# Patient Record
Sex: Female | Born: 1975
Health system: Southern US, Community
[De-identification: ages and names within clinical notes are randomized; demographics above are authoritative.]

## PROBLEM LIST (undated history)

## (undated) DIAGNOSIS — F419 Anxiety disorder, unspecified: Secondary | ICD-10-CM

## (undated) DIAGNOSIS — I10 Essential (primary) hypertension: Secondary | ICD-10-CM

## (undated) DIAGNOSIS — K219 Gastro-esophageal reflux disease without esophagitis: Secondary | ICD-10-CM

## (undated) DIAGNOSIS — E0590 Thyrotoxicosis, unspecified without thyrotoxic crisis or storm: Secondary | ICD-10-CM

## (undated) DIAGNOSIS — R519 Headache, unspecified: Secondary | ICD-10-CM

## (undated) HISTORY — DX: Gastro-esophageal reflux disease without esophagitis: K21.9

## (undated) HISTORY — DX: Thyrotoxicosis, unspecified without thyrotoxic crisis or storm: E05.90

## (undated) HISTORY — PX: TUBAL LIGATION: SHX77

## (undated) HISTORY — PX: LEG SURGERY: SHX1003

## (undated) HISTORY — PX: FOOT SURGERY: SHX648

## (undated) HISTORY — PX: OTHER SURGICAL HISTORY: SHX169

---

## 2012-01-21 DIAGNOSIS — M79604 Pain in right leg: Secondary | ICD-10-CM | POA: Insufficient documentation

## 2012-01-21 DIAGNOSIS — L209 Atopic dermatitis, unspecified: Secondary | ICD-10-CM | POA: Insufficient documentation

## 2012-03-18 DIAGNOSIS — L732 Hidradenitis suppurativa: Secondary | ICD-10-CM | POA: Insufficient documentation

## 2017-05-10 DIAGNOSIS — I890 Lymphedema, not elsewhere classified: Secondary | ICD-10-CM | POA: Insufficient documentation

## 2018-02-23 DIAGNOSIS — R079 Chest pain, unspecified: Secondary | ICD-10-CM | POA: Insufficient documentation

## 2018-02-23 DIAGNOSIS — M791 Myalgia, unspecified site: Secondary | ICD-10-CM | POA: Insufficient documentation

## 2018-11-09 DIAGNOSIS — Z720 Tobacco use: Secondary | ICD-10-CM | POA: Insufficient documentation

## 2019-05-15 ENCOUNTER — Other Ambulatory Visit: Payer: Self-pay

## 2019-05-15 DIAGNOSIS — Z20822 Contact with and (suspected) exposure to covid-19: Secondary | ICD-10-CM

## 2019-05-18 LAB — NOVEL CORONAVIRUS, NAA: SARS-CoV-2, NAA: NOT DETECTED

## 2019-06-21 ENCOUNTER — Other Ambulatory Visit: Payer: Self-pay | Admitting: Physician Assistant

## 2019-06-21 ENCOUNTER — Other Ambulatory Visit (HOSPITAL_COMMUNITY): Payer: Self-pay | Admitting: Physician Assistant

## 2019-06-21 DIAGNOSIS — M799 Soft tissue disorder, unspecified: Secondary | ICD-10-CM

## 2019-06-21 DIAGNOSIS — R946 Abnormal results of thyroid function studies: Secondary | ICD-10-CM

## 2019-06-27 ENCOUNTER — Ambulatory Visit (HOSPITAL_COMMUNITY)
Admission: RE | Admit: 2019-06-27 | Discharge: 2019-06-27 | Disposition: A | Discharge status: Home / Self Care | Payer: BC Managed Care – PPO | Source: Ambulatory Visit | Attending: Physician Assistant | Admitting: Physician Assistant

## 2019-06-27 ENCOUNTER — Encounter (HOSPITAL_COMMUNITY): Payer: Self-pay

## 2019-06-27 ENCOUNTER — Other Ambulatory Visit: Payer: Self-pay

## 2019-06-27 DIAGNOSIS — M799 Soft tissue disorder, unspecified: Secondary | ICD-10-CM | POA: Diagnosis present

## 2019-06-27 DIAGNOSIS — R946 Abnormal results of thyroid function studies: Secondary | ICD-10-CM

## 2019-06-30 ENCOUNTER — Other Ambulatory Visit (HOSPITAL_COMMUNITY): Payer: Self-pay | Admitting: Physician Assistant

## 2019-06-30 ENCOUNTER — Other Ambulatory Visit: Payer: Self-pay | Admitting: Physician Assistant

## 2019-06-30 DIAGNOSIS — M799 Soft tissue disorder, unspecified: Secondary | ICD-10-CM

## 2019-06-30 DIAGNOSIS — R946 Abnormal results of thyroid function studies: Secondary | ICD-10-CM

## 2019-07-05 ENCOUNTER — Emergency Department (HOSPITAL_COMMUNITY): Payer: BC Managed Care – PPO

## 2019-07-05 ENCOUNTER — Encounter (HOSPITAL_COMMUNITY): Payer: Self-pay | Admitting: Emergency Medicine

## 2019-07-05 ENCOUNTER — Emergency Department (HOSPITAL_COMMUNITY)
Admission: EM | Admit: 2019-07-05 | Discharge: 2019-07-05 | Disposition: A | Discharge status: Home / Self Care | Payer: BC Managed Care – PPO | Attending: Emergency Medicine | Admitting: Emergency Medicine

## 2019-07-05 ENCOUNTER — Ambulatory Visit (HOSPITAL_COMMUNITY): Payer: BC Managed Care – PPO

## 2019-07-05 ENCOUNTER — Encounter (HOSPITAL_COMMUNITY): Payer: Self-pay

## 2019-07-05 ENCOUNTER — Other Ambulatory Visit: Payer: Self-pay

## 2019-07-05 DIAGNOSIS — I10 Essential (primary) hypertension: Secondary | ICD-10-CM | POA: Insufficient documentation

## 2019-07-05 DIAGNOSIS — E049 Nontoxic goiter, unspecified: Secondary | ICD-10-CM | POA: Diagnosis not present

## 2019-07-05 DIAGNOSIS — E042 Nontoxic multinodular goiter: Secondary | ICD-10-CM | POA: Insufficient documentation

## 2019-07-05 DIAGNOSIS — R200 Anesthesia of skin: Secondary | ICD-10-CM | POA: Insufficient documentation

## 2019-07-05 DIAGNOSIS — F17210 Nicotine dependence, cigarettes, uncomplicated: Secondary | ICD-10-CM | POA: Insufficient documentation

## 2019-07-05 DIAGNOSIS — Z91040 Latex allergy status: Secondary | ICD-10-CM | POA: Diagnosis not present

## 2019-07-05 DIAGNOSIS — M542 Cervicalgia: Secondary | ICD-10-CM | POA: Diagnosis present

## 2019-07-05 HISTORY — DX: Essential (primary) hypertension: I10

## 2019-07-05 LAB — BASIC METABOLIC PANEL
Anion gap: 8 (ref 5–15)
BUN: 9 mg/dL (ref 6–20)
CO2: 25 mmol/L (ref 22–32)
Calcium: 9 mg/dL (ref 8.9–10.3)
Chloride: 104 mmol/L (ref 98–111)
Creatinine, Ser: 0.74 mg/dL (ref 0.44–1.00)
GFR calc Af Amer: >60 mL/min (ref >60)
GFR calc non Af Amer: >60 mL/min (ref >60)
Glucose, Bld: 92 mg/dL (ref 70–99)
Potassium: 4.3 mmol/L (ref 3.5–5.1)
Sodium: 137 mmol/L (ref 135–145)

## 2019-07-05 LAB — POC URINE PREG, ED: Preg Test, Ur: NEGATIVE

## 2019-07-05 MED ORDER — IOHEXOL 300 MG/ML  SOLN
75.0000 mL | Freq: Once | INTRAMUSCULAR | Status: AC | PRN
  Administered 2019-07-05: 75 mL via INTRAVENOUS

## 2019-07-05 NOTE — ED Provider Notes (Signed)
[facility] EMERGENCY DEPARTMENT Provider Note   CSN: [id number] Arrival date & time: 07/05/19  1113     History   Chief Complaint Chief Complaint  Patient presents with  . Neck Pain  . Numbness    HPI Gina Coleman is a 43 y.o. female.     Patient is a 43 year old female who presents to the emergency department with a complaint of neck pain and facial numbness.  The patient states that approximately a week ago she began to have some fullness in her neck and under her neck.  She went to her primary physician she had an ultrasound and was told that she had some abnormality of her thyroid gland.  She was referred to an endocrinologist, but she says she has not heard from the endocrinology specialist yet.  This morning she noticed what felt like more fullness in her neck.  It was accompanied by a sensation of twitching in her left face.  This resolved after she took a shower.  It has not returned.  She reports that the fullness under her jaw and in her neck seems to still be present.  She is not having problems speaking, and she is not having problem swallowing.  She is not short of breath.  She presents now for assistance with these issues.  The history is provided by the patient.  Neck Pain Associated symptoms: no chest pain, no fever, no numbness, no photophobia and no weakness     Past Medical History:  Diagnosis Date  . Hypertension     There are no active problems to display for this patient.   Past Surgical History:  Procedure Laterality Date  . foot sx Right   . leg sx Right      OB History   No obstetric history on file.      Home Medications    Prior to Admission medications   Not on File    Family History No family history on file.  Social History Social History   Tobacco Use  . Smoking status: Current Some Day Smoker    Types: Cigarettes  . Smokeless tobacco: Never Used  Substance Use Topics  . Alcohol use: Yes    Comment: social   .  Drug use: Never     Allergies   Latex   Review of Systems Review of Systems  Constitutional: Negative for activity change, appetite change and fever.  HENT: Negative for congestion, ear discharge, ear pain, facial swelling, nosebleeds, rhinorrhea, sneezing and tinnitus.   Eyes: Negative for photophobia, pain and discharge.  Respiratory: Negative for cough, choking, shortness of breath and wheezing.   Cardiovascular: Negative for chest pain, palpitations and leg swelling.  Gastrointestinal: Negative for abdominal pain, blood in stool, constipation, diarrhea, nausea and vomiting.  Genitourinary: Negative for difficulty urinating, dysuria, flank pain, frequency and hematuria.  Musculoskeletal: Positive for neck pain. Negative for back pain, gait problem and myalgias.  Skin: Negative for color change, rash and wound.  Neurological: Negative for dizziness, seizures, syncope, facial asymmetry, speech difficulty, weakness and numbness.  Hematological: Negative for adenopathy. Does not bruise/bleed easily.  Psychiatric/Behavioral: Negative for agitation, confusion, hallucinations, self-injury and suicidal ideas. The patient is not nervous/anxious.      Physical Exam Updated Vital Signs BP (!) 166/95 (BP Location: Right Arm)   Pulse 69   Temp 98.4 F (36.9 C) (Oral)   Resp 16   Ht 5\' 4"  (1.626 m)   Wt 87.5 kg   SpO2 100%  BMI 33.13 kg/m   Physical Exam Vitals signs and nursing note reviewed.  Constitutional:      General: She is not in acute distress.    Appearance: She is well-developed. She is not toxic-appearing.  HENT:     Head: Normocephalic and atraumatic.     Right Ear: Tympanic membrane and external ear normal.     Left Ear: Tympanic membrane and external ear normal.  Eyes:     General: Lids are normal. No scleral icterus.       Right eye: No discharge.        Left eye: No discharge.     Conjunctiva/sclera: Conjunctivae normal.     Pupils: Pupils are equal, round,  and reactive to light.  Neck:     Musculoskeletal: Normal range of motion and neck supple. Muscular tenderness present. No neck rigidity.     Vascular: No carotid bruit.     Trachea: No tracheal deviation.     Comments: There is full range of motion of the neck. There is question of enlargement of the thyroid. No hot areas noted. Trachea is midline. Cardiovascular:     Rate and Rhythm: Normal rate and regular rhythm.     Pulses: Normal pulses.     Heart sounds: Normal heart sounds.  Pulmonary:     Effort: Pulmonary effort is normal. No respiratory distress.     Breath sounds: Normal breath sounds. No stridor. No wheezing or rales.  Abdominal:     General: Bowel sounds are normal. There is no distension.     Palpations: Abdomen is soft.     Tenderness: There is no abdominal tenderness. There is no guarding or rebound.  Musculoskeletal: Normal range of motion.        General: No tenderness.  Lymphadenopathy:     Head:     Right side of head: No submandibular adenopathy.     Left side of head: No submandibular adenopathy.  Skin:    General: Skin is warm and dry.     Findings: No rash.  Neurological:     Mental Status: She is alert and oriented to person, place, and time.     Cranial Nerves: No cranial nerve deficit.     Sensory: No sensory deficit.     Motor: No abnormal muscle tone or seizure activity.     Coordination: Coordination normal.  Psychiatric:        Speech: Speech normal.      ED Treatments / Results  Labs (all labs ordered are listed, but only abnormal results are displayed) Labs Reviewed  BASIC METABOLIC PANEL  POC URINE PREG, ED    EKG None  Radiology No results found.  Procedures Procedures (including critical care time)  Medications Ordered in ED Medications - No data to display   Initial Impression / Assessment and Plan / ED Course  I have reviewed the triage vital signs and the nursing notes.  Pertinent labs & imaging results that were  available during my care of the patient were reviewed by me and considered in my medical decision making (see chart for details).          Final Clinical Impressions(s) / ED Diagnoses MDM  Vital signs are stable. Airway is patent. No stridor noted. Will obtain CT of the neck for additional evaluation of sensation of fullness under the left jaw and neck.  Urine preg is negative. Bmet is WNL.  Pt waiting for CT. Recheck is negative for an neuro changes.  Pt ambulatory without problem.     Final diagnoses:  Enlarged thyroid  Multiple thyroid nodules    ED Discharge Orders    None       Lily Kocher, PA-C 07/05/19 [place], [place], DO 07/08/19 [phone]

## 2019-07-05 NOTE — Discharge Instructions (Addendum)
Your blood pressure is slightly elevated, otherwise your vital signs are within normal limits.  Your oxygen level is 100% on room air, which is within normal limits.  Your chemistries are within normal limits.  The CT scan of your neck shows no neck mass, or pathological lymph nodes appreciated.  There is no abnormal findings beneath your left jaw.  There are no abnormal abscess areas related to your teeth.  There is noted an enlarged thyroid gland with multiple thyroid nodules.  This is similar to what was seen on your ultrasound on July 28.  Please see the endocrinology specialist as suggested by your primary physician for additional evaluation and management of this issue.  She has multiple

## 2019-07-05 NOTE — ED Triage Notes (Signed)
Pt states that she has had swelling under her left jaw she states that she woke up with a feeling of numbness on the left side of her face.

## 2019-07-10 ENCOUNTER — Other Ambulatory Visit: Payer: Self-pay

## 2019-07-11 ENCOUNTER — Encounter: Payer: Self-pay | Admitting: Internal Medicine

## 2019-07-11 ENCOUNTER — Ambulatory Visit (INDEPENDENT_AMBULATORY_CARE_PROVIDER_SITE_OTHER): Payer: BC Managed Care – PPO | Admitting: Internal Medicine

## 2019-07-11 VITALS — BP 142/82 | HR 84 | Temp 98.5°F | Ht 63.0 in | Wt 197.0 lb

## 2019-07-11 DIAGNOSIS — E042 Nontoxic multinodular goiter: Secondary | ICD-10-CM

## 2019-07-11 DIAGNOSIS — E061 Subacute thyroiditis: Secondary | ICD-10-CM

## 2019-07-11 DIAGNOSIS — R7989 Other specified abnormal findings of blood chemistry: Secondary | ICD-10-CM | POA: Diagnosis not present

## 2019-07-11 LAB — T4, FREE: Free T4: 1.07 ng/dL (ref 0.60–1.60)

## 2019-07-11 LAB — TSH: TSH: 0.46 u[IU]/mL (ref 0.35–4.50)

## 2019-07-11 NOTE — Progress Notes (Signed)
Name: Gina Coleman  MRN/ DOB: [id number], October 21, 1976    Age/ Sex: 43 y.o., female    PCP: Ginger Organ   Reason for Endocrinology Evaluation: Hyperthyroidism     Date of Initial Endocrinology Evaluation: 07/12/2019     HPI: Gina Coleman is a 43 y.o. female with a past medical history of HTN. The patient presented for initial endocrinology clinic visit on 07/12/2019 for consultative assistance with her subclinical hyperthyroidism   Pt was noted to have a low TSH at 0.399 uIU/mL during an evaluation for an enlarged cervical lymph node in 05/2019. She was started on Abx.    Today she is crying in the office, c/o upper neck severe pain that has been there since July, the pain was severe enough for her to reprt to the ED and was told she has " too many  " thyroid nodules and is concerned about this. She takes Aleve for the pain. Has noted some enlargement in her neck on 07/05/2019 which is mainly on the left with extension to her left ear.   Denies dysphagia or voice hoarseness.   No recent viral infections.   No biotin intake   No FH of thyroid disorder  HISTORY:  Past Medical History:  Past Medical History:  Diagnosis Date  . Hypertension    Past Surgical History:  Past Surgical History:  Procedure Laterality Date  . foot sx Right   . leg sx Right       Social History:  reports that she quit smoking 6 days ago. Her smoking use included cigarettes. She has never used smokeless tobacco. She reports current alcohol use. She reports that she does not use drugs.  Family History: family history is not on file.   HOME MEDICATIONS: Allergies as of 07/11/2019      Reactions   Latex       Medication List       Accurate as of July 11, 2019 11:59 PM. If you have any questions, ask your nurse or doctor.        cephALEXin 500 MG capsule Commonly known as: KEFLEX   lisinopril 5 MG tablet Commonly known as: ZESTRIL Take by mouth.         REVIEW  OF SYSTEMS: A comprehensive ROS was conducted with the patient and is negative except as per HPI and below:  Review of Systems  Constitutional: Negative for fever and weight loss.  HENT: Negative for congestion and sore throat.   Eyes: Negative for blurred vision and pain.  Respiratory: Negative for cough and shortness of breath.   Cardiovascular: Negative for chest pain and palpitations.  Gastrointestinal: Positive for diarrhea. Negative for nausea.  Genitourinary: Positive for frequency.  Endo/Heme/Allergies: Positive for polydipsia.  Psychiatric/Behavioral: The patient is nervous/anxious and has insomnia.        OBJECTIVE:  VS: BP (!) 142/82 (BP Location: Left Arm, Patient Position: Sitting, Cuff Size: Large)   Pulse 84   Temp 98.5 F (36.9 C)   Ht 5\' 3"  (1.6 m)   Wt 197 lb (89.4 kg)   SpO2 99%   BMI 34.90 kg/m    Wt Readings from Last 3 Encounters:  07/11/19 197 lb (89.4 kg)  07/05/19 193 lb (87.5 kg)     EXAM: General: Pt is crying   Hydration: Well-hydrated with moist mucous membranes and good skin turgor  Eyes: External eye exam normal without stare, lid lag or exophthalmos.  EOM intact.    Ears,  Nose, Throat: Hearing: Grossly intact bilaterally Dental: Good dentition  Throat: Clear without mass, erythema or exudate  Neck: General: Left cervical adenopathy noted  Thyroid: Thyroid gland shows a right thyroid nodule with tenderness. No thyroid bruit.  Lungs: Clear with good BS bilat with no rales, rhonchi, or wheezes  Heart: Auscultation: RRR.  Abdomen: Normoactive bowel sounds, soft, nontender, without masses or organomegaly palpable  Extremities:  BL LE: No pretibial edema normal ROM and strength.  Skin: Hair: Texture and amount normal with gender appropriate distribution Skin Inspection: No rashes Skin Palpation: Skin temperature, texture, and thickness normal to palpation  Neuro: Cranial nerves: II - XII grossly intact  Motor: Normal strength throughout  DTRs: 2+ and symmetric in UE without delay in relaxation phase  Mental Status: Judgment, insight: Intact Orientation: Oriented to time, place, and person Mood and affect: pt is overwhelmed, concerned and crying      DATA REVIEWED: 06/20/2019  TSH 0.399 uIU/mL (0.450-4.5 )     Thyroid Ultrasound 07/12/2019   Nodule # 1:  Location: Right; Superior  Maximum size: 3.9 cm; Other 2 dimensions: 2.8 x 1.4 cm  Composition: solid/almost completely solid (2)  Echogenicity: hypoechoic (2)  Shape: not taller-than-wide (0)  Margins: smooth (0)  Echogenic foci: none (0)  ACR TI-RADS total points: 4.  ACR TI-RADS risk category: TR4 (4-6 points).  ACR TI-RADS recommendations:  **Given size (>/= 1.5 cm) and appearance, fine needle aspiration of this moderately suspicious nodule should be considered based on TI-RADS criteria.  _________________________________________________________  Nodule # 2: 0.9 cm hypoechoic solid nodule in the right inferior gland. No further follow-up required.  _________________________________________________________  Nodule # 3: 0.9 cm hypoechoic solid nodule in the superior aspect of the thyroid isthmus. No further follow-up required.  _________________________________________________________  Nodule # 4:  Location: Isthmus; Inferior  Maximum size: 1.9 cm; Other 2 dimensions: 1.7 x 1.1 cm  Composition: solid/almost completely solid (2)  Echogenicity: isoechoic (1)  Shape: not taller-than-wide (0)  Margins: ill-defined (0)  Echogenic foci: none (0)  ACR TI-RADS total points: 3.  ACR TI-RADS risk category: TR3 (3 points).  ACR TI-RADS recommendations:  *Given size (>/= 1.5 - 2.4 cm) and appearance, a follow-up ultrasound in 1 year should be considered based on TI-RADS criteria.  _________________________________________________________  IMPRESSION: 1. Diffusely enlarged, heterogeneous and  multinodular thyroid gland. 2. The 3.9 cm TI-RADS category 4 nodule (labeled # 1) in the deep aspect of the right superior gland meets criteria for consideration of fine-needle aspiration biopsy. 3. The 1.9 cm TI-RADS category 3 nodule (labeled # 4) in the inferior aspect of the thyroid isthmus meets criteria for follow-up ultrasound in 1 year. 4. Additional small subcentimeter thyroid nodules do not meet criteria for further evaluation.     Old records , labs and images have been reviewed.   ASSESSMENT/PLAN/RECOMMENDATIONS:   1. Subacute Thyroiditis :   - This is secondary to the current viral infection that she has, repeat TFT's are normal on today's labs.  - She was advised to use NSAID's as needed until the inflammation resolved.     2. Multinodular Goiter :   - The pain is from the inflammation which is secondary to the infection and NOT from her nodules. Her pain is focused on the left, her largest thyroid nodule is on the right - Will proceed with FNA of the Right superior nodule  - Reassurance provided at this time   F/u in 2 months   Signed electronically by: Elenora Gamma  Kelton Pillar, MD  [facility] Endocrinology  [facility] [address] [place], [place] [address] Phone: [phone] FAX: [phone]   CC: Ginger Organ [address] [place] [address] Phone: [phone] Fax: [phone]   Return to Endocrinology clinic as below: Future Appointments  Date Time Provider [place]  08/02/2019  4:00 PM [facility] [place] [facility]  09/07/2019 10:30 AM Yitzchok Carriger, Melanie Crazier, MD [facility] None

## 2019-07-11 NOTE — Patient Instructions (Signed)
-   Stop by the lab today   - We will set you up for biopsy of your right thyroid nodule , if you don;t hear from anyone in 2 weeks, please call us.

## 2019-07-12 ENCOUNTER — Encounter: Payer: Self-pay | Admitting: Internal Medicine

## 2019-07-12 DIAGNOSIS — R7989 Other specified abnormal findings of blood chemistry: Secondary | ICD-10-CM | POA: Insufficient documentation

## 2019-07-12 DIAGNOSIS — E061 Subacute thyroiditis: Secondary | ICD-10-CM | POA: Insufficient documentation

## 2019-07-12 DIAGNOSIS — E042 Nontoxic multinodular goiter: Secondary | ICD-10-CM | POA: Insufficient documentation

## 2019-07-13 LAB — T3: T3, Total: 169 ng/dL (ref 76–181)

## 2019-07-13 LAB — TRAB (TSH RECEPTOR BINDING ANTIBODY): TRAB: <1 IU/L (ref <=2.00)

## 2019-07-26 ENCOUNTER — Telehealth: Payer: Self-pay | Admitting: Internal Medicine

## 2019-07-26 NOTE — Telephone Encounter (Signed)
Spoke to Gina Coleman to verify pain was on the left side, read last note and see that the nodules were on right side,please advise

## 2019-07-26 NOTE — Telephone Encounter (Signed)
Her PCP needs to work up her for left side pain. Her thyroid is not the cause based on the ultrasound results.

## 2019-07-26 NOTE — Telephone Encounter (Signed)
Patient called to advise that she experiencing increased eft side neck pain with swelling as noted at her 07/11/2019 appointment. OTC pain medication helps somewhat.  Patient would like to proceed/what to do.  Please advise at [phone]

## 2019-07-26 NOTE — Telephone Encounter (Signed)
Pt informed

## 2019-08-02 ENCOUNTER — Other Ambulatory Visit (HOSPITAL_COMMUNITY)
Admission: RE | Admit: 2019-08-02 | Discharge: 2019-08-02 | Disposition: A | Discharge status: Home / Self Care | Payer: BC Managed Care – PPO | Source: Ambulatory Visit | Attending: Radiology | Admitting: Radiology

## 2019-08-02 ENCOUNTER — Ambulatory Visit
Admission: RE | Admit: 2019-08-02 | Discharge: 2019-08-02 | Disposition: A | Discharge status: Home / Self Care | Payer: BC Managed Care – PPO | Source: Ambulatory Visit | Attending: Internal Medicine | Admitting: Internal Medicine

## 2019-08-02 DIAGNOSIS — E042 Nontoxic multinodular goiter: Secondary | ICD-10-CM

## 2019-08-02 DIAGNOSIS — E041 Nontoxic single thyroid nodule: Secondary | ICD-10-CM | POA: Diagnosis present

## 2019-08-08 ENCOUNTER — Encounter: Payer: Self-pay | Admitting: Internal Medicine

## 2019-08-10 ENCOUNTER — Telehealth: Payer: Self-pay | Admitting: Internal Medicine

## 2019-08-10 NOTE — Telephone Encounter (Signed)
Patient requests to be called at ph# [phone] to given her biopsy results.

## 2019-08-11 NOTE — Telephone Encounter (Signed)
Pt aware of results and stated that she will keep appt  In October

## 2019-08-11 NOTE — Telephone Encounter (Signed)
Please advise 

## 2019-09-05 ENCOUNTER — Other Ambulatory Visit: Payer: Self-pay

## 2019-09-07 ENCOUNTER — Encounter: Payer: Self-pay | Admitting: Internal Medicine

## 2019-09-07 ENCOUNTER — Ambulatory Visit (INDEPENDENT_AMBULATORY_CARE_PROVIDER_SITE_OTHER): Payer: BC Managed Care – PPO | Admitting: Internal Medicine

## 2019-09-07 ENCOUNTER — Other Ambulatory Visit: Payer: Self-pay

## 2019-09-07 VITALS — BP 122/64 | HR 74 | Temp 98.5°F | Ht 63.0 in | Wt 211.2 lb

## 2019-09-07 DIAGNOSIS — E042 Nontoxic multinodular goiter: Secondary | ICD-10-CM | POA: Diagnosis not present

## 2019-09-07 DIAGNOSIS — E0590 Thyrotoxicosis, unspecified without thyrotoxic crisis or storm: Secondary | ICD-10-CM | POA: Diagnosis not present

## 2019-09-07 LAB — T4, FREE: Free T4: 1.11 ng/dL (ref 0.60–1.60)

## 2019-09-07 LAB — TSH: TSH: 0.14 u[IU]/mL — ABNORMAL LOW (ref 0.35–4.50)

## 2019-09-07 NOTE — Patient Instructions (Signed)
-   Please stop by the lab today  

## 2019-09-07 NOTE — Progress Notes (Signed)
Name: Gina Coleman  MRN/ DOB: [id number], 1975-12-30    Age/ Sex: 43 y.o., female     PCP: Ginger Organ   Reason for Endocrinology Evaluation: MNG     Initial Endocrinology Clinic Visit: 07/12/2019    PATIENT IDENTIFIER: Gina Coleman is a 43 y.o., female with a past medical history of HTN. She has followed with [facility] since 07/12/2019 for consultative assistance with management of her 07/12/2019.   HISTORICAL SUMMARY: The patient was first  noted to have a low TSH at 0.399 uIU/mL during an evaluation for an enlarged cervical lymph node in 05/2019. She was started on Ab x at the time.  Repeat TFT's were normal in 07/2019 . Ultrasound showed MNG   No FH of thyroid disorder  SUBJECTIVE:   During last visit (07/11/2019): Repeat labs showed normal TFT's   Today (09/07/2019):  Gina Coleman is here for a follow up on subclinical hyperthyroidism. S/P FNA of the RUP nodule with benign cytology.  Denies local neck symptoms  Weight has gained Denies constipation  No anxiety and or depression      ROS:  As per HPI.   HISTORY:  Past Medical History:  Past Medical History:  Diagnosis Date  . Hypertension     Past Surgical History:  Past Surgical History:  Procedure Laterality Date  . foot sx Right   . leg sx Right      Social History:  reports that she quit smoking about 2 months ago. Her smoking use included cigarettes. She has never used smokeless tobacco. She reports current alcohol use. She reports that she does not use drugs.  Family History: No family history on file.   HOME MEDICATIONS: Allergies as of 09/07/2019      Reactions   Latex       Medication List       Accurate as of September 07, 2019 10:33 AM. If you have any questions, ask your nurse or doctor.        cephALEXin 500 MG capsule Commonly known as: KEFLEX   lisinopril 5 MG tablet Commonly known as: ZESTRIL Take by mouth.         OBJECTIVE:    PHYSICAL EXAM: VS: There were no vitals taken for this visit.   EXAM: General: Pt appears well and is in NAD  Hydration: Well-hydrated with moist mucous membranes and good skin turgor  Eyes: External eye exam normal without stare, lid lag or exophthalmos.  EOM intact.  PERRL.  Ears, Nose, Throat: Hearing: Grossly intact bilaterally Dental: Good dentition  Throat: Clear without mass, erythema or exudate  Neck: General: Supple without adenopathy. Thyroid: Thyroid size normal.  No goiter or nodules appreciated. No thyroid bruit.  Lungs: Clear with good BS bilat with no rales, rhonchi, or wheezes  Heart: Auscultation: RRR.  Abdomen: Normoactive bowel sounds, soft, nontender, without masses or organomegaly palpable  Extremities: Gait and station: Normal gait  Digits and nails: No clubbing, cyanosis, petechiae, or nodes Head and neck: Normal alignment and mobility BL UE: Normal ROM and strength. BL LE: No pretibial edema normal ROM and strength.  Skin: Hair: Texture and amount normal with gender appropriate distribution Skin Inspection: No rashes, acanthosis nigricans/skin tags. No lipohypertrophy Skin Palpation: Skin temperature, texture, and thickness normal to palpation  Neuro: Cranial nerves: II - XII grossly intact  Cerebellar: Normal coordination and movement; no tremor Motor: Normal strength throughout DTRs: 2+ and symmetric in UE without delay in relaxation phase  Mental Status: Judgment, insight: Intact Orientation: Oriented to time, place, and person Memory: Intact for recent and remote events Mood and affect: No depression, anxiety, or agitation     DATA REVIEWED: Results for Gina Coleman, Gina Coleman (MRN [id number]) as of 09/08/2019 09:31  Ref. Range 07/11/2019 10:01 09/07/2019 10:54  TSH Latest Ref Range: 0.35 - 4.50 uIU/mL 0.46 0.14 (L)  Triiodothyronine (T3) Latest Ref Range: 76 - 181 ng/dL 169   T4,Free(Direct) Latest Ref Range: 0.60 - 1.60 ng/dL 1.07 1.11   Thyroid  Ultrasound 07/12/2019   Nodule # 1:  Location: Right; Superior  Maximum size: 3.9 cm; Other 2 dimensions: 2.8 x 1.4 cm  Composition: solid/almost completely solid (2)  Echogenicity: hypoechoic (2)  Shape: not taller-than-wide (0)  Margins: smooth (0)  Echogenic foci: none (0)  ACR TI-RADS total points: 4.  ACR TI-RADS risk category: TR4 (4-6 points).  ACR TI-RADS recommendations:  **Given size (>/= 1.5 cm) and appearance, fine needle aspiration of this moderately suspicious nodule should be considered based on TI-RADS criteria.  _________________________________________________________  Nodule # 2: 0.9 cm hypoechoic solid nodule in the right inferior gland. No further follow-up required.  _________________________________________________________  Nodule # 3: 0.9 cm hypoechoic solid nodule in the superior aspect of the thyroid isthmus. No further follow-up required.  _________________________________________________________  Nodule # 4:  Location: Isthmus; Inferior  Maximum size: 1.9 cm; Other 2 dimensions: 1.7 x 1.1 cm  Composition: solid/almost completely solid (2)  Echogenicity: isoechoic (1)  Shape: not taller-than-wide (0)  Margins: ill-defined (0)  Echogenic foci: none (0)  ACR TI-RADS total points: 3.  ACR TI-RADS risk category: TR3 (3 points).  ACR TI-RADS recommendations:  *Given size (>/= 1.5 - 2.4 cm) and appearance, a follow-up ultrasound in 1 year should be considered based on TI-RADS criteria.  _________________________________________________________  IMPRESSION: 1. Diffusely enlarged, heterogeneous and multinodular thyroid gland. 2. The 3.9 cm TI-RADS category 4 nodule (labeled # 1) in the deep aspect of the right superior gland meets criteria for consideration of fine-needle aspiration biopsy. 3. The 1.9 cm TI-RADS category 3 nodule (labeled # 4) in the inferior aspect of the thyroid isthmus meets  criteria for follow-up ultrasound in 1 year. 4. Additional small subcentimeter thyroid nodules do not meet criteria for further evaluation.  FNA (08/02/2019)   THYROID, FINE NEEDLE ASPIRATION, RUP (SPECIMEN 1 OF 1, COLLECTED 08/02/19): CONSISTENT WITH BENIGN FOLLICULAR NODULE (BETHESDA CATEGORY II).  ASSESSMENT / PLAN / RECOMMENDATIONS:   1. Multinodular Goiter :    - No local neck symptoms  - S/P Benign FNA of the Right upper nodule 08/2019 - Clinically euthyroid  - Will repeat ultrasound in 1 yr     2. Subclinical Hyperthyroidism :   - Clinically euthyroid  - Most likely secondary to autonomous nodule at this time.  - Will recheck in 2 months    F/U in 1 yr    Addendum: Unable to reach the pt by phone, will send a letter.   Signed electronically by: Mack Guise, MD  [facility] Endocrinology  [facility] [address] [place], [place] [address] Phone: [phone] FAX: [phone]      CC: Ginger Organ [address] [place] [address] Phone: [phone]  Fax: [phone]   Return to Endocrinology clinic as below: Future Appointments  Date Time Provider [place]  09/12/2019  2:00 PM Aviva Signs, MD [facility] None

## 2019-09-08 ENCOUNTER — Telehealth: Payer: Self-pay | Admitting: Internal Medicine

## 2019-09-08 DIAGNOSIS — E0590 Thyrotoxicosis, unspecified without thyrotoxic crisis or storm: Secondary | ICD-10-CM | POA: Insufficient documentation

## 2019-09-11 ENCOUNTER — Encounter: Payer: Self-pay | Admitting: Internal Medicine

## 2019-09-11 NOTE — Telephone Encounter (Signed)
NO call back by the pt , a letter was sent

## 2019-09-12 ENCOUNTER — Encounter: Payer: Self-pay | Admitting: General Surgery

## 2019-09-12 ENCOUNTER — Ambulatory Visit: Payer: BC Managed Care – PPO | Admitting: General Surgery

## 2019-09-12 ENCOUNTER — Other Ambulatory Visit: Payer: Self-pay

## 2019-09-12 VITALS — BP 127/85 | HR 76 | Temp 97.7°F | Resp 16 | Ht 64.0 in | Wt 211.0 lb

## 2019-09-12 DIAGNOSIS — R221 Localized swelling, mass and lump, neck: Secondary | ICD-10-CM | POA: Diagnosis not present

## 2019-09-12 NOTE — Progress Notes (Signed)
Gina Coleman; [id number]; 1976-04-06   HPI Patient is a 43 year old black female who was referred to my care by Collene Mares for evaluation treatment of a tender nodule in the left submandibular area.  Patient states she has had this for some time now, and seems to be enlarging in size.  Is very tender to touch.  She denies any fever or chills.  During her work-up for this neck nodule, she was found to have a nodule in the right lobe of the thyroid gland.  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  She has no dysphagia, voice changes, or family history of thyroid cancer.  She has no history of neck radiation.  She currently has 0 out of 10 pain. Past Medical History:  Diagnosis Date  . Hypertension     Past Surgical History:  Procedure Laterality Date  . foot sx Right   . leg sx Right     History reviewed. No pertinent family history.  Current Outpatient Medications on File Prior to Visit  Medication Sig Dispense Refill  . lisinopril (ZESTRIL) 5 MG tablet Take by mouth.    . triamcinolone ointment (KENALOG) 0.5 % Apply topically.    . cephALEXin (KEFLEX) 500 MG capsule      No current facility-administered medications on file prior to visit.     Allergies  Allergen Reactions  . Latex     Social History   Substance and Sexual Activity  Alcohol Use Yes   Comment: social     Social History   Tobacco Use  Smoking Status Former Smoker  . Types: Cigarettes  . Quit date: 07/06/2019  . Years since quitting: 0.1  Smokeless Tobacco Never Used    Review of Systems  Constitutional: Negative.   HENT: Negative.   Eyes: Negative.   Respiratory: Negative.   Cardiovascular: Negative.   Gastrointestinal: Negative.   Genitourinary: Negative.   Musculoskeletal: Positive for neck pain.  Skin: Negative.   Neurological: Positive for headaches.  Endo/Heme/Allergies: Negative.   Psychiatric/Behavioral: Negative.     Objective   Vitals:   09/12/19 1359  BP: 127/85  Pulse: 76  Resp: 16  Temp: 97.7 F (36.5 C)  SpO2: 98%    Physical Exam Vitals signs reviewed.  Constitutional:      Appearance: Normal appearance. She is not ill-appearing.  HENT:     Head: Normocephalic and atraumatic.  Neck:     Musculoskeletal: Normal range of motion and neck supple. No neck rigidity.     Comments: I could not easily palpate the thyroid gland due to body habitus.  I do feel a pea-sized subcutaneous nodule in the left submandibular region.  It is approximately 1.5 cm in its greatest diameter.  No other cervical lymphadenopathy was appreciated. Cardiovascular:     Rate and Rhythm: Normal rate and regular rhythm.     Heart sounds: Normal heart sounds. No murmur. No friction rub. No gallop.   Pulmonary:     Effort: Pulmonary effort is normal. No respiratory distress.     Breath sounds: No stridor. No wheezing, rhonchi or rales.  Skin:    General: Skin is warm and dry.  Neurological:     Mental Status: She is alert and oriented to person, place, and time.    MRI report reviewed.  Primary care notes reviewed.  Ultrasound of thyroid gland and pathology notes reviewed. Assessment  Left submandibular lymph node enlargement of unknown significance Plan   I told the patient that  any further work-up and treatment plans are outside my realm of expertise.  Will arrange for her to see an ENT for further evaluation and treatment.

## 2019-09-18 ENCOUNTER — Other Ambulatory Visit (HOSPITAL_COMMUNITY): Payer: Self-pay | Admitting: Physician Assistant

## 2019-09-18 DIAGNOSIS — Z1231 Encounter for screening mammogram for malignant neoplasm of breast: Secondary | ICD-10-CM

## 2019-09-19 ENCOUNTER — Other Ambulatory Visit (HOSPITAL_COMMUNITY): Payer: Self-pay | Admitting: Otolaryngology

## 2019-09-19 ENCOUNTER — Encounter (HOSPITAL_COMMUNITY): Payer: Self-pay | Admitting: Radiology

## 2019-09-19 DIAGNOSIS — R220 Localized swelling, mass and lump, head: Secondary | ICD-10-CM

## 2019-09-19 NOTE — Progress Notes (Unsigned)
Macyn Rutz Female, 43 y.o., December 30, 1975 MRN:  [id number] Phone:  [phone] [name]) PCP:  Cory Munch, PA-C Coverage:  Sherre Poot [employer] Ppo Next Appt With Radiology (AP-MM 1) 09/27/2019 at 7:30 AM  RE: US Guided Needle Placement Received: Today Message Contents  Markus Daft, MD  Garth Bigness D        Please ask the office for Dr. Trish Mage clinic note so we know exactly what she wants to biopsy. Assume there is a palpable small lymph node at area of concern. Can schedule for FNA of the palpable lymph at area of interest.   Henn   Previous Messages  ----- Message -----  From: Garth Bigness D  Sent: 09/19/2019  1:18 PM EDT  To: Ir Procedure Requests  Subject: US Guided Needle Placement            Procedure:  US Guided Needle Placement   Reason: left submadiblar, LN Enlargement   History: [place], CT, prior Biopsy in computer   Dr. Blenda Nicely, [place]  [phone]

## 2019-09-20 ENCOUNTER — Encounter (HOSPITAL_COMMUNITY): Payer: Self-pay | Admitting: Radiology

## 2019-09-20 NOTE — Progress Notes (Unsigned)
Gina Coleman Female, 43 y.o., 10-26-1976 MRN:  [id number] Phone:  [phone] [name]) PCP:  Cory Munch, PA-C Coverage:  Sherre Poot [employer] Ppo Next Appt With Radiology (AP-MM 1) 09/27/2019 at 7:30 AM  RE: US Guided Needle Placement Received: Today Message Contents  Markus Daft, MD  Garth Bigness D        Discussed with Dr. Blenda Nicely. Patient feels something in left submandibular area and there is a mildly enlarged lymph node posterior to left submandibular gland. See CT neck 07/05/19, se2, im 43. She would like [place] to biopsy this node. She understands it will possibly be FNA only.    Please schedule for US guided FNA of left cervical lymph node biopsy. Will need cytology for this procedure.   Henn   Previous Messages  ----- Message -----  From: Garth Bigness D  Sent: 09/19/2019  4:09 PM EDT  To: Markus Daft, MD  Subject: RE: US Guided Needle Placement          I just scheduled her and scanned it in so you can look at them and just let me know. I could not scan without putting it on the schedule.  ----- Message -----  From: Markus Daft, MD  Sent: 09/19/2019  4:00 PM EDT  To: Jillyn Hidden  Subject: RE: US Guided Needle Placement          If you have them, I can review before scheduling.   Adam  ----- Message -----  From: Garth Bigness D  Sent: 09/19/2019  3:17 PM EDT  To: Markus Daft, MD  Subject: RE: US Guided Needle Placement          Hi, I have notes do you want me to scan it in under media so that you can look over them before I schedule patient or do you want me to go ahead and schedule patient and just scan in after patient is scheduled?  ----- Message -----  From: Markus Daft, MD  Sent: 09/19/2019  2:52 PM EDT  To: Jillyn Hidden  Subject: RE: US Guided Needle Placement          Please ask the office for Dr. Trish Mage clinic note so we know exactly what she wants to biopsy. Assume there is  a palpable small lymph node at area of concern. Can schedule for FNA of the palpable lymph at area of interest.   Henn  ----- Message -----  From: Garth Bigness D  Sent: 09/19/2019  1:18 PM EDT  To: Ir Procedure Requests  Subject: US Guided Needle Placement            Procedure:  US Guided Needle Placement   Reason: left submadiblar, LN Enlargement   History: [place], CT, prior Biopsy in computer   Dr. Blenda Nicely, [place]  [phone]

## 2019-09-21 ENCOUNTER — Other Ambulatory Visit: Payer: Self-pay

## 2019-09-21 ENCOUNTER — Other Ambulatory Visit: Payer: Self-pay | Admitting: Emergency Medicine

## 2019-09-21 ENCOUNTER — Ambulatory Visit
Admission: EM | Admit: 2019-09-21 | Discharge: 2019-09-21 | Disposition: A | Discharge status: Home / Self Care | Payer: BC Managed Care – PPO

## 2019-09-21 DIAGNOSIS — N644 Mastodynia: Secondary | ICD-10-CM | POA: Diagnosis not present

## 2019-09-21 DIAGNOSIS — R922 Inconclusive mammogram: Secondary | ICD-10-CM

## 2019-09-21 NOTE — ED Triage Notes (Signed)
Pt presents with left sided breast pain, states that pain is in the nipple area and began about a week ago

## 2019-09-21 NOTE — Discharge Instructions (Signed)
Rest and push fluids Take OTC ibuprofen and/ or tylenol as needed for pain Breast ultrasound ordered.  [facility] should be in contact with you to schedule that appointment.  Please call them or the office if you have not heard from them by next week.   Please follow up with PCP following the results of your test(s) Return or go to the ED if you have any new or worsening symptoms fever, chills, nausea, vomiting, redness, skin changes, dimpling, nipple discharge, swollen lymph nodes, shortness of breath, chest pain, abdominal pain, changes in bowel or bladder function, etc..Marland Kitchen

## 2019-09-21 NOTE — ED Provider Notes (Signed)
[facility]   [id number] 09/21/19 Arrival Time: [id number]  CC: LT Breast pain  SUBJECTIVE:  Gina Coleman is a 43 y.o. female who presents with a left breast pain that began 1 week ago.  Denies trauma, or precipitating event.  Localizes the pain to left nipple and radiates to center of breast.  Describes it as worsening, intermittent, burning and 8/10.  Has tried aleve with relief.  Symptoms made worse with leaning forward.  Denies similar symptoms in the past.  Denies fever, chills, nausea, vomiting, erythema, skin changes, dimpling, nipple discharge, LAD, SOB, chest pain, abdominal pain, changes in bowel or bladder function.    Mammogram at age 7 with dense breast tissue, but otherwise normal  Possible breast cancer in grandmother, patient unsure, was in foster care and has not had a lot of contact with biological family.    ROS: As per HPI.  All other pertinent ROS negative.     Past Medical History:  Diagnosis Date  . Hypertension    Past Surgical History:  Procedure Laterality Date  . foot sx Right   . leg sx Right    Allergies  Allergen Reactions  . Latex    No current facility-administered medications on file prior to encounter.    Current Outpatient Medications on File Prior to Encounter  Medication Sig Dispense Refill  . triamcinolone ointment (KENALOG) 0.5 % Apply topically.    . [DISCONTINUED] lisinopril (ZESTRIL) 5 MG tablet Take by mouth.     Social History   Socioeconomic History  . Marital status: Married    Spouse name: Not on file  . Number of children: Not on file  . Years of education: Not on file  . Highest education level: Not on file  Occupational History  . Not on file  Social Needs  . Financial resource strain: Not on file  . Food insecurity    Worry: Not on file    Inability: Not on file  . Transportation needs    Medical: Not on file    Non-medical: Not on file  Tobacco Use  . Smoking status: Former Smoker    Types:  Cigarettes    Quit date: 07/06/2019    Years since quitting: 0.2  . Smokeless tobacco: Never Used  Substance and Sexual Activity  . Alcohol use: Yes    Comment: social   . Drug use: Never  . Sexual activity: Not on file  Lifestyle  . Physical activity    Days per week: Not on file    Minutes per session: Not on file  . Stress: Not on file  Relationships  . Social Herbalist on phone: Not on file    Gets together: Not on file    Attends religious service: Not on file    Active member of club or organization: Not on file    Attends meetings of clubs or organizations: Not on file    Relationship status: Not on file  . Intimate partner violence    Fear of current or ex partner: Not on file    Emotionally abused: Not on file    Physically abused: Not on file    Forced sexual activity: Not on file  Other Topics Concern  . Not on file  Social History Narrative  . Not on file   No family history on file.  OBJECTIVE:  Vitals:   09/21/19 1301  BP: (!) 147/89  Pulse: 85  Resp: 20  Temp: 98.9 F (  37.2 C)  SpO2: 98%    General appearance: alert; no distress Eyes: PERRLA; EOMI HENT: normocephalic; atraumatic; oropharynx clear Neck: supple with FROM Lungs: clear to auscultation bilaterally Heart: regular rate and rhythm.   Chest wall: Breast appear symmetrical without obvious skin changes or erythema; no axillary LAD; dense/ ropey breast tissue underneath bilateral nipples, also dense/ ropey area approximately 3-4 x 1 cm area of LT breast in 1 o'clock position; mildly TTP over left nipple, left nipple retraction (pt states this is stable and chronic); nipples expressed without obvious discharge (see drawing below) Extremities: no edema; symmetrical with no gross deformities Skin: warm and dry Neurologic: ambulates without difficulty Psychological: alert and cooperative; normal mood and affect     ASSESSMENT & PLAN:  1. Breast pain, left   2. Dense breast tissue     Rest and push fluids Take OTC ibuprofen and/ or tylenol as needed for pain Breast ultrasound ordered.  [facility] should be in contact with you to schedule that appointment.  Please call them or the office if you have not heard from them by next week.   Please follow up with PCP following the results of your test(s) Return or go to the ED if you have any new or worsening symptoms fever, chills, nausea, vomiting, redness, skin changes, dimpling, nipple discharge, swollen lymph nodes, shortness of breath, chest pain, abdominal pain, changes in bowel or bladder function, etc...  Reviewed expectations re: course of current medical issues. Questions answered. Outlined signs and symptoms indicating need for more acute intervention. Patient verbalized understanding. After Visit Summary given.   Lestine Box, PA-C 09/21/19 1338

## 2019-09-26 ENCOUNTER — Other Ambulatory Visit: Payer: Self-pay | Admitting: Radiology

## 2019-09-27 ENCOUNTER — Other Ambulatory Visit: Payer: Self-pay

## 2019-09-27 ENCOUNTER — Ambulatory Visit (HOSPITAL_COMMUNITY)
Admission: RE | Admit: 2019-09-27 | Discharge: 2019-09-27 | Disposition: A | Discharge status: Home / Self Care | Payer: BC Managed Care – PPO | Source: Ambulatory Visit | Attending: Otolaryngology | Admitting: Otolaryngology

## 2019-09-27 ENCOUNTER — Encounter (HOSPITAL_COMMUNITY): Payer: Self-pay

## 2019-09-27 ENCOUNTER — Ambulatory Visit (HOSPITAL_COMMUNITY): Payer: BC Managed Care – PPO

## 2019-09-27 DIAGNOSIS — R220 Localized swelling, mass and lump, head: Secondary | ICD-10-CM | POA: Insufficient documentation

## 2019-09-27 LAB — PROTIME-INR
INR: 1 (ref 0.8–1.2)
Prothrombin Time: 12.7 seconds (ref 11.4–15.2)

## 2019-09-27 LAB — CBC
HCT: 42.8 % (ref 36.0–46.0)
Hemoglobin: 13.8 g/dL (ref 12.0–15.0)
MCH: 28.8 pg (ref 26.0–34.0)
MCHC: 32.2 g/dL (ref 30.0–36.0)
MCV: 89.4 fL (ref 80.0–100.0)
Platelets: 242 10*3/uL (ref 150–400)
RBC: 4.79 MIL/uL (ref 3.87–5.11)
RDW: 12.7 % (ref 11.5–15.5)
WBC: 5.8 10*3/uL (ref 4.0–10.5)
nRBC: 0 % (ref 0.0–0.2)

## 2019-09-27 MED ORDER — SODIUM CHLORIDE 0.9 % IV SOLN: INTRAVENOUS | Status: DC

## 2019-09-27 MED ORDER — FENTANYL CITRATE (PF) 100 MCG/2ML IJ SOLN
INTRAMUSCULAR | Status: AC | PRN
  Administered 2019-09-27: 50 ug via INTRAVENOUS
  Administered 2019-09-27: 25 ug via INTRAVENOUS

## 2019-09-27 MED ORDER — MIDAZOLAM HCL 2 MG/2ML IJ SOLN
INTRAMUSCULAR | Status: AC
  Filled 2019-09-27: qty 2

## 2019-09-27 MED ORDER — FENTANYL CITRATE (PF) 100 MCG/2ML IJ SOLN
INTRAMUSCULAR | Status: AC
  Filled 2019-09-27: qty 2

## 2019-09-27 MED ORDER — LIDOCAINE HCL (PF) 1 % IJ SOLN
INTRAMUSCULAR | Status: AC
  Filled 2019-09-27: qty 30

## 2019-09-27 MED ORDER — MIDAZOLAM HCL 2 MG/2ML IJ SOLN
INTRAMUSCULAR | Status: AC | PRN
  Administered 2019-09-27: 0.5 mg via INTRAVENOUS
  Administered 2019-09-27: 1 mg via INTRAVENOUS

## 2019-09-27 NOTE — Discharge Instructions (Signed)
Needle Biopsy, Care After °These instructions tell you how to care for yourself after your procedure. Your doctor may also give you more specific instructions. Call your doctor if you have any problems or questions. °What can I expect after the procedure? °After the procedure, it is common to have: °· Soreness. °· Bruising. °· Mild pain. °Follow these instructions at home: ° °· Return to your normal activities as told by your doctor. Ask your doctor what activities are safe for you. °· Take over-the-counter and prescription medicines only as told by your doctor. °· Wash your hands with soap and water before you change your bandage (dressing). If you cannot use soap and water, use hand sanitizer. °· Follow instructions from your doctor about: °? How to take care of your puncture site. °? When and how to change your bandage. °? When to remove your bandage. °· Check your puncture site every day for signs of infection. Watch for: °? Redness, swelling, or pain. °? Fluid or blood.  °? Pus or a bad smell. °? Warmth. °· Do not take baths, swim, or use a hot tub until your doctor approves. Ask your doctor if you may take showers. You may only be allowed to take sponge baths. °· Keep all follow-up visits as told by your doctor. This is important. °Contact a doctor if you have: °· A fever. °· Redness, swelling, or pain at the puncture site, and it lasts longer than a few days. °· Fluid, blood, or pus coming from the puncture site. °· Warmth coming from the puncture site. °Get help right away if: °· You have a lot of bleeding from the puncture site. °Summary °· After the procedure, it is common to have soreness, bruising, or mild pain at the puncture site. °· Check your puncture site every day for signs of infection, such as redness, swelling, or pain. °· Get help right away if you have severe bleeding from your puncture site. °This information is not intended to replace advice given to you by your health care provider. Make  sure you discuss any questions you have with your health care provider. °Document Released: 10/29/2008 Document Revised: 11/29/2017 Document Reviewed: 11/29/2017 °Elsevier Patient Education © 2020 [employer] ° °

## 2019-09-27 NOTE — Procedures (Signed)
    Interventional Radiology Procedure:   Indications: Prominent left submandibular lymph node  Procedure: US guided FNA  Findings: Prominent lymph node adjacent to left submandibular gland.   5 FNAs obtained.   Complications: None     EBL: less than 10 ml  Plan: Discharge to home    [place]. Anselm Pancoast, MD  Pager: [phone]

## 2019-09-27 NOTE — H&P (Signed)
Chief Complaint: Patient was seen in consultation today for left submandibular node biopsy at the request of Helayne Seminole  Referring Physician(s): Helayne Seminole  Supervising Physician: Markus Daft  Patient Status: [facility] - Out-pt  History of Present Illness: Gina Coleman is a 43 y.o. female   Pt felt node approx 2 mo ago Antibiotics - no change Continues to enlarge continues to be painful  CT 07/05/19: No soft tissue neck mass or pathologically enlarged cervical chain lymph nodes. No finding to account for the palpable abnormality beneath the left mandible.  Referral to ENT Dr Blenda Nicely  Now scheduled for biopsy of Left submandibular node  Past Medical History:  Diagnosis Date  . Hypertension     Past Surgical History:  Procedure Laterality Date  . foot sx Right   . leg sx Right     Allergies: Latex  Medications: Prior to Admission medications   Medication Sig Start Date End Date Taking? Authorizing Provider  triamcinolone ointment (KENALOG) 0.5 % Apply topically. 04/28/17  Yes [provider]  lisinopril (ZESTRIL) 5 MG tablet Take by mouth. 04/14/19 09/21/19  [provider]     History reviewed. No pertinent family history.  Social History   Socioeconomic History  . Marital status: Married    Spouse name: Not on file  . Number of children: Not on file  . Years of education: Not on file  . Highest education level: Not on file  Occupational History  . Not on file  Social Needs  . Financial resource strain: Not on file  . Food insecurity    Worry: Not on file    Inability: Not on file  . Transportation needs    Medical: Not on file    Non-medical: Not on file  Tobacco Use  . Smoking status: Former Smoker    Types: Cigarettes    Quit date: 07/06/2019    Years since quitting: 0.2  . Smokeless tobacco: Never Used  Substance and Sexual Activity  . Alcohol use: Yes    Comment: social   . Drug use: Never  . Sexual  activity: Not on file  Lifestyle  . Physical activity    Days per week: Not on file    Minutes per session: Not on file  . Stress: Not on file  Relationships  . Social Herbalist on phone: Not on file    Gets together: Not on file    Attends religious service: Not on file    Active member of club or organization: Not on file    Attends meetings of clubs or organizations: Not on file    Relationship status: Not on file  Other Topics Concern  . Not on file  Social History Narrative  . Not on file    Review of Systems: A 12 point ROS discussed and pertinent positives are indicated in the HPI above.  All other systems are negative.  Review of Systems  Constitutional: Negative for activity change, fatigue and fever.  HENT: Negative for sore throat and trouble swallowing.   Respiratory: Negative for cough.   Cardiovascular: Negative for chest pain.  Gastrointestinal: Negative for abdominal pain.  Psychiatric/Behavioral: Negative for behavioral problems and confusion.    Vital Signs: BP (!) 172/96   Pulse 95   Temp 99 F (37.2 C) (Skin)   Resp 18   Ht 5\' 4"  (1.626 m)   Wt 209 lb (94.8 kg)   LMP 09/12/2019   SpO2 100%  BMI 35.87 kg/m   Physical Exam Vitals signs reviewed.  Neck:     Musculoskeletal: Normal range of motion.  Cardiovascular:     Rate and Rhythm: Normal rate and regular rhythm.     Heart sounds: Normal heart sounds.  Pulmonary:     Effort: Pulmonary effort is normal.     Breath sounds: Normal breath sounds.  Abdominal:     Palpations: Abdomen is soft.  Musculoskeletal: Normal range of motion.  Skin:    General: Skin is warm and dry.  Neurological:     Mental Status: She is alert and oriented to person, place, and time.  Psychiatric:        Mood and Affect: Mood normal.        Behavior: Behavior normal.        Thought Content: Thought content normal.        Judgment: Judgment normal.     Imaging: No results found.  Labs:  CBC:  No results for input(s): WBC, HGB, HCT, PLT in the last 8760 hours.  COAGS: No results for input(s): INR, APTT in the last 8760 hours.  BMP: Recent Labs    07/05/19 1418  NA 137  K 4.3  CL 104  CO2 25  GLUCOSE 92  BUN 9  CALCIUM 9.0  CREATININE 0.74  GFRNONAA >60  GFRAA >60    LIVER FUNCTION TESTS: No results for input(s): BILITOT, AST, ALT, ALKPHOS, PROT, ALBUMIN in the last 8760 hours.  TUMOR MARKERS: No results for input(s): AFPTM, CEA, CA199, CHROMGRNA in the last 8760 hours.  Assessment and Plan:  Left submandibular LAN Enlarging and painful No change with antibiotic course Scheduled now for biopsy of same Risks and benefits of L submandibular LN bx was discussed with the patient and/or patient's family including, but not limited to bleeding, infection, damage to adjacent structures or low yield requiring additional tests.  All of the questions were answered and there is agreement to proceed.  Consent signed and in chart.   Thank you for this interesting consult.  I greatly enjoyed meeting Gina Coleman and look forward to participating in their care.  A copy of this report was sent to the requesting provider on this date.  Electronically Signed: Lavonia Drafts, PA-C 09/27/2019, 11:29 AM   I spent a total of  30 Minutes   in face to face in clinical consultation, greater than 50% of which was counseling/coordinating care for left submandibular LN bx

## 2019-09-29 LAB — CYTOLOGY - NON PAP

## 2019-09-29 LAB — SURGICAL PATHOLOGY

## 2019-10-02 ENCOUNTER — Ambulatory Visit (INDEPENDENT_AMBULATORY_CARE_PROVIDER_SITE_OTHER): Payer: BC Managed Care – PPO | Admitting: Family Medicine

## 2019-10-02 ENCOUNTER — Encounter: Payer: Self-pay | Admitting: Family Medicine

## 2019-10-02 ENCOUNTER — Other Ambulatory Visit: Payer: Self-pay

## 2019-10-02 VITALS — BP 122/82 | HR 68 | Temp 99.0°F | Ht 64.0 in | Wt 208.4 lb

## 2019-10-02 DIAGNOSIS — E042 Nontoxic multinodular goiter: Secondary | ICD-10-CM

## 2019-10-02 DIAGNOSIS — R599 Enlarged lymph nodes, unspecified: Secondary | ICD-10-CM | POA: Diagnosis not present

## 2019-10-02 NOTE — Patient Instructions (Addendum)
Keep appointment for follow up with surgeon on Nov 9th-concern for pain continuing over 5 months-pt needs options for reducing size of lymph node vs surgery to remove. Pt has multinodular thyroid with slightly low TSH-seeing endocrine who does not feel the enlarged node is related  [facility]

## 2019-10-02 NOTE — Progress Notes (Signed)
New Patient Office Visit  Subjective:  Patient ID: Gina Coleman, female    DOB: 1976-10-10  Age: 43 y.o. MRN: [id number]  CC:  Chief Complaint  Patient presents with  . Establish Care  . Thyroid Problem    biopsy was normal has thyroid nodules     HPI Gina Coleman presents for thyroid concerns Pt with normal biopsy of the lymph node -follow up with ENT surgeon on Monday 10/09/19 MRI completed in Sept after pt noted lymph node in July-no change in size since originally noted.  Pt states she noted in the bed and felt a mass in her neck. Only one lymph node noted at onset-cervical only. No lymph nodes noted in the axillary or inguinal area.  Blood work completed 07/2019 cbc normal . MRI showed enlarged left submandibular lymph node.  FNA report pending officially. Pt states still noting lymph node with constant pain. Pt states she tried not to manipulate area.  Pt takes asa and aleve otc.  Pt with thyroid nodules-FNA completed. hyperthyroid  HTN-pt treated in the past with lisinopril-concern it might be causing symptoms Past Medical History:  Diagnosis Date  . Hypertension     Past Surgical History:  Procedure Laterality Date  . foot sx Right   . leg sx Right     No family history on file.  Social History   Socioeconomic History  . Marital status: Married    Spouse name: Not on file  . Number of children: Not on file  . Years of education: Not on file  . Highest education level: Not on file  Occupational History  . Not on file  Social Needs  . Financial resource strain: Not on file  . Food insecurity    Worry: Not on file    Inability: Not on file  . Transportation needs    Medical: Not on file    Non-medical: Not on file  Tobacco Use  . Smoking status: Former Smoker    Types: Cigarettes    Quit date: 07/06/2019    Years since quitting: 0.2  . Smokeless tobacco: Never Used  Substance and Sexual Activity  . Alcohol use: Yes    Comment: social   . Drug  use: Never  . Sexual activity: Not on file  Lifestyle  . Physical activity    Days per week: Not on file    Minutes per session: Not on file  . Stress: Not on file  Relationships  . Social Herbalist on phone: Not on file    Gets together: Not on file    Attends religious service: Not on file    Active member of club or organization: Not on file    Attends meetings of clubs or organizations: Not on file    Relationship status: Not on file  . Intimate partner violence    Fear of current or ex partner: Not on file    Emotionally abused: Not on file    Physically abused: Not on file    Forced sexual activity: Not on file  Other Topics Concern  . Not on file  Social History Narrative  . Not on file    ROS Review of Systems  Constitutional: Negative.   HENT:       Neck pain Teeth evaluated by dentist with cavities filled Lymph node has not change in size since July   Eyes: Negative.   Respiratory: Negative.   Cardiovascular: Negative.   Gastrointestinal: Negative.  Endocrine: Negative.   Genitourinary: Negative.   Musculoskeletal: Negative.   Skin: Negative.   Allergic/Immunologic: Negative.   Neurological: Negative.   Hematological: Negative.   Psychiatric/Behavioral: Negative.     Objective:   Today's Vitals: BP 122/82 (BP Location: Left Arm, Patient Position: Sitting, Cuff Size: Normal)   Pulse 68   Temp 99 F (37.2 C) (Oral)   Ht 5\' 4"  (1.626 m)   Wt 208 lb 6.4 oz (94.5 kg)   LMP 09/12/2019   SpO2 96%   BMI 35.77 kg/m   Physical Exam Constitutional:      Appearance: Normal appearance. She is normal weight.  HENT:     Head: Normocephalic and atraumatic.     Right Ear: Tympanic membrane, ear canal and external ear normal.     Left Ear: Tympanic membrane, ear canal and external ear normal.     Nose: Nose normal.  Eyes:     Conjunctiva/sclera: Conjunctivae normal.  Neck:     Musculoskeletal: Muscular tenderness present.     Comments: Left   Cardiovascular:     Rate and Rhythm: Normal rate and regular rhythm.     Pulses: Normal pulses.     Heart sounds: Normal heart sounds.  Pulmonary:     Effort: Pulmonary effort is normal.     Breath sounds: Normal breath sounds.  Neurological:     Mental Status: She is alert and oriented to person, place, and time.     Assessment & Plan:   1. Multinodular goiter Followed by endo-FNA of largest nodule - COMPLETE METABOLIC PANEL WITH GFR - CBC with Differential  2. Enlarged lymph node FNA completed by ENT-f/u on Monday-repeat labwork Options for removal vs meds to decrease size to be discussed with specialist - COMPLETE METABOLIC PANEL WITH GFR - CBC with Differential Outpatient Encounter Medications as of 10/02/2019  Medication Sig  . triamcinolone ointment (KENALOG) 0.5 % Apply topically.  . [DISCONTINUED] lisinopril (ZESTRIL) 5 MG tablet Take by mouth.   No facility-administered encounter medications on file as of 10/02/2019.     Follow-up: cbc, cmp  Cy Bresee Hannah Beat, MD

## 2019-10-04 LAB — CBC WITH DIFFERENTIAL/PLATELET
Absolute Monocytes: 622 cells/uL (ref 200–950)
Basophils Absolute: 28 cells/uL (ref 0–200)
Basophils Relative: 0.5 %
Eosinophils Absolute: 149 cells/uL (ref 15–500)
Eosinophils Relative: 2.7 %
HCT: 40.8 % (ref 35.0–45.0)
Hemoglobin: 13.1 g/dL (ref 11.7–15.5)
Lymphs Abs: 1799 cells/uL (ref 850–3900)
MCH: 29.1 pg (ref 27.0–33.0)
MCHC: 32.1 g/dL (ref 32.0–36.0)
MCV: 90.7 fL (ref 80.0–100.0)
MPV: 11.5 fL (ref 7.5–12.5)
Monocytes Relative: 11.3 %
Neutro Abs: 2904 cells/uL (ref 1500–7800)
Neutrophils Relative %: 52.8 %
Platelets: 248 10*3/uL (ref 140–400)
RBC: 4.5 10*6/uL (ref 3.80–5.10)
RDW: 12 % (ref 11.0–15.0)
Total Lymphocyte: 32.7 %
WBC: 5.5 10*3/uL (ref 3.8–10.8)

## 2019-10-04 LAB — COMPLETE METABOLIC PANEL WITH GFR
AG Ratio: 1.2 (calc) (ref 1.0–2.5)
ALT: 15 U/L (ref 6–29)
AST: 12 U/L (ref 10–30)
Albumin: 4.1 g/dL (ref 3.6–5.1)
Alkaline phosphatase (APISO): 114 U/L (ref 31–125)
BUN: 11 mg/dL (ref 7–25)
CO2: 28 mmol/L (ref 20–32)
Calcium: 9.2 mg/dL (ref 8.6–10.2)
Chloride: 107 mmol/L (ref 98–110)
Creat: 0.69 mg/dL (ref 0.50–1.10)
GFR, Est African American: 123 mL/min/{1.73_m2} (ref >=60)
GFR, Est Non African American: 106 mL/min/{1.73_m2} (ref >=60)
Globulin: 3.4 g/dL (calc) (ref 1.9–3.7)
Glucose, Bld: 91 mg/dL (ref 65–139)
Potassium: 3.9 mmol/L (ref 3.5–5.3)
Sodium: 141 mmol/L (ref 135–146)
Total Bilirubin: 1 mg/dL (ref 0.2–1.2)
Total Protein: 7.5 g/dL (ref 6.1–8.1)

## 2019-10-09 ENCOUNTER — Ambulatory Visit: Payer: BC Managed Care – PPO | Admitting: Family Medicine

## 2019-10-19 ENCOUNTER — Ambulatory Visit: Payer: BC Managed Care – PPO | Admitting: Family Medicine

## 2019-10-19 ENCOUNTER — Other Ambulatory Visit: Payer: Self-pay | Admitting: Otolaryngology

## 2019-10-19 DIAGNOSIS — R591 Generalized enlarged lymph nodes: Secondary | ICD-10-CM

## 2019-10-23 ENCOUNTER — Other Ambulatory Visit: Payer: Self-pay

## 2019-10-23 ENCOUNTER — Ambulatory Visit (INDEPENDENT_AMBULATORY_CARE_PROVIDER_SITE_OTHER): Payer: BC Managed Care – PPO | Admitting: Family Medicine

## 2019-10-23 ENCOUNTER — Encounter: Payer: Self-pay | Admitting: Family Medicine

## 2019-10-23 VITALS — BP 154/91 | HR 100 | Temp 98.3°F | Resp 15 | Ht 64.0 in | Wt 208.4 lb

## 2019-10-23 DIAGNOSIS — I10 Essential (primary) hypertension: Secondary | ICD-10-CM

## 2019-10-23 DIAGNOSIS — R599 Enlarged lymph nodes, unspecified: Secondary | ICD-10-CM

## 2019-10-23 DIAGNOSIS — G8929 Other chronic pain: Secondary | ICD-10-CM | POA: Diagnosis not present

## 2019-10-23 DIAGNOSIS — M542 Cervicalgia: Secondary | ICD-10-CM | POA: Diagnosis not present

## 2019-10-23 MED ORDER — AMLODIPINE BESYLATE 5 MG PO TABS: 5.0000 mg | ORAL_TABLET | Freq: Every day | ORAL | 3 refills | Status: DC

## 2019-10-23 NOTE — Progress Notes (Signed)
Established Patient Office Visit  Subjective:  Patient ID: Gina Coleman, female    DOB: 1976/03/25  Age: 43 y.o. MRN: [id number]  CC:  Chief Complaint  Patient presents with  . lymph nodes in neck and groin are swelling    the lymph nodes in neck are causing her to have a very bad headache, first noticed Sunday. She thinks they have gone down a little bit  ENT note reviewed: 09/27/19- [facility]- "FINAL MICROSCOPIC DIAGNOSIS: - Lymphoid tissue present  SPECIMEN ADEQUACY: Satisfactory for evaluation  DIAGNOSTIC COMMENTS: The material mostly consists of small round to slightly irregular lymphocytes without overt atypia. This is admixed with scattered larger lymphoid cells. Flow cytometric analysis was attempted but there was insufficient cells present in the sample. A cell block material is not available. In this setting, it is difficult to evaluate for the presence of a lymphoproliferative process. A tissue biopsy is recommended if clinically indicated especially if adenopathy is persistent or Progressive." 07/05/19 CT neck- "IMPRESSION: No soft tissue neck mass or pathologically enlarged cervical chain lymph nodes. No finding to account for the palpable abnormality beneath the left mandible."   Assessment:  My impression is that Gina Coleman has  1. Lymphadenopathy of head and neck  . Benign appearance on CT scan, not changing in size, non concerning biopsy results. We can always perform biopsy later if symptoms change.    Plan:  1. Repeat left neck US to ensure not changed in size in 4-6 weeks with followup, followup sooner if problems arise Pt states she had an MRI but this is not available on care everywhere HPI Gina Coleman presents for lymph node evaluation. labwork 11/3 reviewed -CBC, CMP normal Pain Scale with headache-daily 9/10-noted during the day-on the left side of the face coming from the neck(area of concern) to the left side of the   head-throbbing.sensation that does not wake pt from sleep but has onset later in the day.  Aleve decreased pain to 3/10 and allows pt to be functional.  Pt states ENT completed exam with follow up u/s scheduled in Dec.  Previously followed by endo for multinodular goiter.    ENDO note:Gina Coleman is a 43 y.o., female with a past medical history of HTN. She has followed with [facility] since 07/12/2019 for consultative assistance with management of her 07/12/2019. 09/07/2019):  Gina Coleman is here for a follow up on subclinical hyperthyroidism. S/P FNA of the RUP nodule with benign cytology.  Denies local neck symptoms  IMPRESSION: thyroid ultrasoud 1. Diffusely enlarged, heterogeneous and multinodular thyroid gland. 2. The 3.9 cm TI-RADS category 4 nodule (labeled # 1) in the deep aspect of the right superior gland meets criteria for consideration of fine-needle aspiration biopsy. 3. The 1.9 cm TI-RADS category 3 nodule (labeled # 4) in the inferior aspect of the thyroid isthmus meets criteria for follow-up ultrasound in 1 year. 4. Additional small subcentimeter thyroid nodules do not meet criteria for further evaluation.  FNA (08/02/2019)   THYROID, FINE NEEDLE ASPIRATION, RUP (SPECIMEN 1 OF 1, COLLECTED 08/02/19): CONSISTENT WITH BENIGN FOLLICULAR NODULE (BETHESDA CATEGORY II).  ASSESSMENT / PLAN / RECOMMENDATIONS:   1. Multinodular Goiter :   - No local neck symptoms  - S/P Benign FNA of the Right upper nodule 08/2019 - Clinically euthyroid  - Will repeat ultrasound in 1 yr   2. Subclinical Hyperthyroidism :  - Clinically euthyroid  - Most likely secondary to autonomous nodule at this time.  - Will recheck  in 2 months  F/U in 1 yr  The patient was first  noted to have a low TSH at 0.399 uIU/mL duringan evaluation for an enlarged cervical lymph node in7/2020. She was started on Ab x at the time. Repeat TFT's were normal in 07/2019 . Ultrasound  showed MNG   Mammogram:10/06/19 No mammographic evidence of malignancy. Breast composition: Heterogeneously dense. BI-RADS Category: 1 - Negative. Recommend routine follow-up.  FINDINGS: 11/6 No suspicious mass or malignant type calcifications are seen in either breast.  No findings explain the patient's intermittent pain.  Past Medical History:  Diagnosis Date  . Hypertension     Past Surgical History:  Procedure Laterality Date  . foot sx Right   . leg sx Right     No family history on file.  Social History   Socioeconomic History  . Marital status: Married    Spouse name: Not on file  . Number of children: Not on file  . Years of education: Not on file  . Highest education level: Not on file  Occupational History  . Not on file  Social Needs  . Financial resource strain: Not on file  . Food insecurity    Worry: Not on file    Inability: Not on file  . Transportation needs    Medical: Not on file    Non-medical: Not on file  Tobacco Use  . Smoking status: Former Smoker    Types: Cigarettes    Quit date: 07/06/2019    Years since quitting: 0.2  . Smokeless tobacco: Never Used  Substance and Sexual Activity  . Alcohol use: Yes    Comment: social   . Drug use: Never  . Sexual activity: Not on file  Lifestyle  . Physical activity    Days per week: Not on file    Minutes per session: Not on file  . Stress: Not on file  Relationships  . Social Herbalist on phone: Not on file    Gets together: Not on file    Attends religious service: Not on file    Active member of club or organization: Not on file    Attends meetings of clubs or organizations: Not on file    Relationship status: Not on file  . Intimate partner violence    Fear of current or ex partner: Not on file    Emotionally abused: Not on file    Physically abused: Not on file    Forced sexual activity: Not on file  Other Topics Concern  . Not on file  Social History Narrative  .  Not on file    Outpatient Medications Prior to Visit  Medication Sig Dispense Refill  . triamcinolone ointment (KENALOG) 0.5 % Apply topically.     No facility-administered medications prior to visit.     Allergies  Allergen Reactions  . Latex   . Other     Other reaction(s): Other GLOVE POWDER = RASH GLOVE POWDER = RASH GLOVE POWDER = RASH     ROS Review of Systems    Objective:    Physical Exam  Constitutional: She is oriented to person, place, and time. She appears well-developed and well-nourished. She appears distressed.  Pt very emotional discussing continued neck pain related to lymph node on her neck  HENT:  Head: Normocephalic and atraumatic.  Eyes: Conjunctivae are normal.  Neck: Normal range of motion.    No discrete node palpable-tenderness to palpation  Cardiovascular: Normal rate, regular rhythm  and normal heart sounds.  Pulmonary/Chest: Effort normal and breath sounds normal.  Lymphadenopathy:    She has no cervical adenopathy.  Neurological: She is alert and oriented to person, place, and time.    BP (!) 154/91   Pulse 100   Temp 98.3 F (36.8 C) (Oral)   Resp 15   Ht 5\' 4"  (1.626 m)   Wt 208 lb 6.4 oz (94.5 kg)   SpO2 98%   BMI 35.77 kg/m  Wt Readings from Last 3 Encounters:  10/23/19 208 lb 6.4 oz (94.5 kg)  10/02/19 208 lb 6.4 oz (94.5 kg)  09/27/19 209 lb (94.8 kg)     Health Maintenance Due  Topic Date Due  . HIV Screening  08/02/1990  . TETANUS/TDAP  08/02/1994  . PAP SMEAR-Modifier  08/02/1996  . INFLUENZA VACCINE  07/01/2019    Lab Results  Component Value Date   TSH 0.14 (L) 09/07/2019   Lab Results  Component Value Date   WBC 5.5 10/03/2019   HGB 13.1 10/03/2019   HCT 40.8 10/03/2019   MCV 90.7 10/03/2019   PLT 248 10/03/2019   Lab Results  Component Value Date   NA 141 10/03/2019   K 3.9 10/03/2019   CO2 28 10/03/2019   GLUCOSE 91 10/03/2019   BUN 11 10/03/2019   CREATININE 0.69 10/03/2019   BILITOT  1.0 10/03/2019   AST 12 10/03/2019   ALT 15 10/03/2019   PROT 7.5 10/03/2019   CALCIUM 9.2 10/03/2019   ANIONGAP 8 07/05/2019    Assessment & Plan:  1. Enlarged lymph node - Ambulatory referral to Pain Clinic ENT following-follow up scheduled for repeat ultrasound. Pt with concerns about ongoing pain related to area-radiating pain from left side of neck into face causing headaches  2. Hypertension, unspecified type Start amlodipine 5mg -take at night bp remains elevated, take bp this thing in the morning and record CMP follow up, lipid panel 3. Chronic neck pain Pain causing neck and head pain - Ambulatory referral to Pain Clinic Neck pain and concern for enlarged area on the left side of the neck since July-seen by endo, ENT and interventional radiology for evaluation.  Follow up ultrasound scheduled .  MRI report of area requested-no available on care everywhere.   Reports from specialist, thyroid and neck node biopsy reviewed, radiology reviewed-10min Greater than 50% of visit spent in counseling reviewing all speciality reports , recommendations, labwork. Pt requesting antibiotics. dw pt no indication antibiotics needed Follow-up: ultrasound as recommended HTN follow up with readings labwork fasting   LISA Hannah Beat, MD

## 2019-10-23 NOTE — Patient Instructions (Signed)
Pain management for ongoing headaches with lymph node enlargement- Start amlodipine 5mg  at night for blood pressure Take blood pressure first thing in the morning and write it down

## 2019-10-31 LAB — COMPLETE METABOLIC PANEL WITH GFR
AG Ratio: 1.2 (calc) (ref 1.0–2.5)
ALT: 10 U/L (ref 6–29)
AST: 14 U/L (ref 10–30)
Albumin: 4.1 g/dL (ref 3.6–5.1)
Alkaline phosphatase (APISO): 116 U/L (ref 31–125)
BUN: 13 mg/dL (ref 7–25)
CO2: 26 mmol/L (ref 20–32)
Calcium: 9.1 mg/dL (ref 8.6–10.2)
Chloride: 105 mmol/L (ref 98–110)
Creat: 0.73 mg/dL (ref 0.50–1.10)
GFR, Est African American: 116 mL/min/{1.73_m2} (ref >=60)
GFR, Est Non African American: 100 mL/min/{1.73_m2} (ref >=60)
Globulin: 3.4 g/dL (calc) (ref 1.9–3.7)
Glucose, Bld: 92 mg/dL (ref 65–99)
Potassium: 3.9 mmol/L (ref 3.5–5.3)
Sodium: 138 mmol/L (ref 135–146)
Total Bilirubin: 0.5 mg/dL (ref 0.2–1.2)
Total Protein: 7.5 g/dL (ref 6.1–8.1)

## 2019-10-31 LAB — LIPID PANEL
Cholesterol: 198 mg/dL (ref <200)
HDL: 58 mg/dL (ref >=50)
LDL Cholesterol (Calc): 126 mg/dL (calc) — ABNORMAL HIGH
Non-HDL Cholesterol (Calc): 140 mg/dL (calc) — ABNORMAL HIGH (ref <130)
Total CHOL/HDL Ratio: 3.4 (calc) (ref <5.0)
Triglycerides: 52 mg/dL (ref <150)

## 2019-11-07 ENCOUNTER — Ambulatory Visit
Admission: RE | Admit: 2019-11-07 | Discharge: 2019-11-07 | Disposition: A | Discharge status: Home / Self Care | Payer: BC Managed Care – PPO | Source: Ambulatory Visit | Attending: Otolaryngology | Admitting: Otolaryngology

## 2019-11-07 DIAGNOSIS — R591 Generalized enlarged lymph nodes: Secondary | ICD-10-CM

## 2019-11-08 ENCOUNTER — Other Ambulatory Visit: Payer: Self-pay | Admitting: Internal Medicine

## 2019-11-08 DIAGNOSIS — E042 Nontoxic multinodular goiter: Secondary | ICD-10-CM

## 2019-11-14 ENCOUNTER — Other Ambulatory Visit: Payer: Self-pay | Admitting: Otolaryngology

## 2019-11-14 ENCOUNTER — Other Ambulatory Visit (INDEPENDENT_AMBULATORY_CARE_PROVIDER_SITE_OTHER): Payer: BC Managed Care – PPO

## 2019-11-14 ENCOUNTER — Telehealth (INDEPENDENT_AMBULATORY_CARE_PROVIDER_SITE_OTHER): Payer: BC Managed Care – PPO | Admitting: Family Medicine

## 2019-11-14 ENCOUNTER — Other Ambulatory Visit: Payer: Self-pay

## 2019-11-14 DIAGNOSIS — E042 Nontoxic multinodular goiter: Secondary | ICD-10-CM

## 2019-11-14 DIAGNOSIS — I10 Essential (primary) hypertension: Secondary | ICD-10-CM | POA: Diagnosis not present

## 2019-11-14 DIAGNOSIS — Z79899 Other long term (current) drug therapy: Secondary | ICD-10-CM

## 2019-11-14 DIAGNOSIS — M542 Cervicalgia: Secondary | ICD-10-CM

## 2019-11-14 DIAGNOSIS — R590 Localized enlarged lymph nodes: Secondary | ICD-10-CM

## 2019-11-14 LAB — TSH: TSH: 0.14 u[IU]/mL — ABNORMAL LOW (ref 0.35–4.50)

## 2019-11-14 LAB — T4, FREE: Free T4: 0.98 ng/dL (ref 0.60–1.60)

## 2019-11-14 NOTE — Progress Notes (Signed)
Virtual Visit via Telephone Note  I connected with Gina Coleman on 11/14/19 at  1:40 PM EST by telephone and verified that I am speaking with the correct person using two identifiers.DOB/address  Location: Patient:home Provider: Office   I discussed the limitations, risks, security and privacy concerns of performing an evaluation and management service by telephone and the availability of in person appointments. I also discussed with the patient that there may be a patient responsible charge related to this service. The patient expressed understanding and agreed to proceed.   History of Present Illness: HTN-amlodipine daily-no concerns-taking bp readings at home. No headaches. No LE edema   Observations/Objective: 12/3-130/80 12/4 148/87 12/7 133/74 12/8 124/88 12/10 131/83 12/12 130/74  Assessment and Plan:  1. Hypertension, unspecified type - Basic metabolic panel - Urinalysis Follow Up Instructions: labwork   I discussed the assessment and treatment plan with the patient. The patient was provided an opportunity to ask questions and all were answered. The patient agreed with the plan and demonstrated an understanding of the instructions.   The patient was advised to call back or seek an in-person evaluation if  concerns-I provided 6 minutes of non-face-to-face time during this encounter.   Gina Coleman Hannah Beat, MD

## 2019-11-14 NOTE — Patient Instructions (Signed)
labwork-non fasting

## 2019-11-18 DIAGNOSIS — I10 Essential (primary) hypertension: Secondary | ICD-10-CM | POA: Insufficient documentation

## 2019-11-23 LAB — BASIC METABOLIC PANEL
BUN: 10 mg/dL (ref 7–25)
CO2: 24 mmol/L (ref 20–32)
Calcium: 8.9 mg/dL (ref 8.6–10.2)
Chloride: 104 mmol/L (ref 98–110)
Creat: 0.73 mg/dL (ref 0.50–1.10)
Glucose, Bld: 119 mg/dL (ref 65–139)
Potassium: 4 mmol/L (ref 3.5–5.3)
Sodium: 138 mmol/L (ref 135–146)

## 2019-11-23 LAB — URINALYSIS
Bilirubin Urine: NEGATIVE
Glucose, UA: NEGATIVE
Hgb urine dipstick: NEGATIVE
Ketones, ur: NEGATIVE
Nitrite: NEGATIVE
Protein, ur: NEGATIVE
Specific Gravity, Urine: 1.021 (ref 1.001–1.03)
pH: 6.5 (ref 5.0–8.0)

## 2019-12-05 ENCOUNTER — Telehealth: Payer: Self-pay | Admitting: *Deleted

## 2019-12-05 NOTE — Telephone Encounter (Signed)
Attempted to call for New Patient assessment. Someone other than the patient answered the phone, I could not understand what she said, then the phone was disconnected. I called again, the call went to voicemail. I left a message requesting patient to call us back.

## 2019-12-05 NOTE — Progress Notes (Signed)
Unsuccessful attempt to contact patient for Virtual Visit (Pain Management Telehealth)   Patient provided contact information:  [phone] (home); [phone] (mobile); (Preferred) [phone] [email or web address]   Pre-screening:  Our staff was unsuccessful in contacting Ms. Pellett using the above provided information.   I unsuccessfully attempted to make contact with Jerre Simon on 12/06/2019 via telephone. I was unable to complete the virtual encounter due to call going directly to voicemail. I was able to leave a message where I clearly identify myself as Gaspar Cola, MD and I left a message to call us back to reschedule the call.  Pharmacotherapy Assessment  Analgesic: No opioid analgesics prescribed by our practice.   Follow-up plan:   Reschedule Visit.  Recent Visits No visits were found meeting these conditions.  Showing recent visits within past 90 days and meeting all other requirements   Today's Visits Date Type Provider Dept  12/06/19 Appointment Milinda Pointer, MD [facility]  Showing today's visits and meeting all other requirements   Future Appointments No visits were found meeting these conditions.  Showing future appointments within next 90 days and meeting all other requirements    Note by: Gaspar Cola, MD Date: 12/06/2019; Time: 12:39 PM

## 2019-12-06 ENCOUNTER — Telehealth: Payer: Self-pay

## 2019-12-06 ENCOUNTER — Ambulatory Visit (HOSPITAL_BASED_OUTPATIENT_CLINIC_OR_DEPARTMENT_OTHER): Payer: BC Managed Care – PPO | Admitting: Pain Medicine

## 2019-12-06 ENCOUNTER — Other Ambulatory Visit: Payer: Self-pay

## 2019-12-06 NOTE — Telephone Encounter (Signed)
Pt was called and message was left on answering service 12/06/19 at 0841.

## 2019-12-07 ENCOUNTER — Other Ambulatory Visit: Payer: BC Managed Care – PPO

## 2019-12-15 ENCOUNTER — Other Ambulatory Visit: Payer: Self-pay

## 2019-12-15 ENCOUNTER — Emergency Department (HOSPITAL_COMMUNITY)
Admission: EM | Admit: 2019-12-15 | Discharge: 2019-12-16 | Disposition: A | Discharge status: Home / Self Care | Payer: BC Managed Care – PPO | Attending: Emergency Medicine | Admitting: Emergency Medicine

## 2019-12-15 ENCOUNTER — Encounter (HOSPITAL_COMMUNITY): Payer: Self-pay | Admitting: Emergency Medicine

## 2019-12-15 DIAGNOSIS — Z87891 Personal history of nicotine dependence: Secondary | ICD-10-CM | POA: Diagnosis not present

## 2019-12-15 DIAGNOSIS — R109 Unspecified abdominal pain: Secondary | ICD-10-CM | POA: Diagnosis not present

## 2019-12-15 DIAGNOSIS — Z79899 Other long term (current) drug therapy: Secondary | ICD-10-CM | POA: Diagnosis not present

## 2019-12-15 DIAGNOSIS — N390 Urinary tract infection, site not specified: Secondary | ICD-10-CM | POA: Diagnosis not present

## 2019-12-15 DIAGNOSIS — I10 Essential (primary) hypertension: Secondary | ICD-10-CM | POA: Insufficient documentation

## 2019-12-15 DIAGNOSIS — Z91040 Latex allergy status: Secondary | ICD-10-CM | POA: Diagnosis not present

## 2019-12-15 LAB — URINALYSIS, ROUTINE W REFLEX MICROSCOPIC
Bilirubin Urine: NEGATIVE
Glucose, UA: NEGATIVE mg/dL
Ketones, ur: NEGATIVE mg/dL
Nitrite: NEGATIVE
Protein, ur: 100 mg/dL — AB
RBC / HPF: >50 RBC/hpf — ABNORMAL HIGH (ref 0–5)
Specific Gravity, Urine: 1.019 (ref 1.005–1.030)
WBC, UA: >50 WBC/hpf — ABNORMAL HIGH (ref 0–5)
pH: 6 (ref 5.0–8.0)

## 2019-12-15 NOTE — ED Triage Notes (Signed)
Patient states painful urination and states that she is having right flank pain with blood in her urine that started yesterday. Patient also having  reoccurring neck pain due to a gland problem.

## 2019-12-16 MED ORDER — PHENAZOPYRIDINE HCL 200 MG PO TABS: 200.0000 mg | ORAL_TABLET | Freq: Three times a day (TID) | ORAL | 0 refills | Status: DC

## 2019-12-16 MED ORDER — CEPHALEXIN 500 MG PO CAPS: 500.0000 mg | ORAL_CAPSULE | Freq: Three times a day (TID) | ORAL | 0 refills | Status: DC

## 2019-12-16 MED ORDER — CEPHALEXIN 500 MG PO CAPS
500.0000 mg | ORAL_CAPSULE | Freq: Once | ORAL | Status: AC
  Administered 2019-12-16: 01:00:00 500 mg via ORAL
  Filled 2019-12-16: qty 1

## 2019-12-16 MED ORDER — PHENAZOPYRIDINE HCL 100 MG PO TABS
200.0000 mg | ORAL_TABLET | Freq: Once | ORAL | Status: AC
  Administered 2019-12-16: 200 mg via ORAL
  Filled 2019-12-16: qty 2

## 2019-12-16 NOTE — Discharge Instructions (Signed)
Drink plenty of fluids.  Take the antibiotics until gone.  Take the Pyridium for the pain on urination, wear old underwear or panty liners because it will stain them if you leak.  Return to the emergency department if you are not improving over the next 48 hours or if you get worsening abdominal pain, fever, or vomiting.

## 2019-12-16 NOTE — ED Provider Notes (Signed)
[facility] EMERGENCY DEPARTMENT Provider Note   CSN: [id number] Arrival date & time: 12/15/19  2001   Time seen 11:50 PM  History Chief Complaint  Patient presents with  . Urinary Tract Infection    right flank pain    Gina Coleman is a 44 y.o. female.  HPI   Patient states she yesterday, January 14 she started having dysuria, frequency, urgency, and hematuria that would come and go.  She started having right-sided lower abdominal pain today.  She denies nausea, vomiting, fever but states she has had mild chills.  She states she had this before when she was pregnant and that child is 73 years old.  Patient also concerned about some swelling in a submandibular gland that she has been seen by ENT from [facility] and had a biopsy that was benign.  She is upset because they would not put her on antibiotics.  I told her I would put her on antibiotics that would help with both.  She also showed me an area of eczema on her right volar wrist that she has been using triamcinolone cream for her eczema.  PCP Corum, Rex Kras, MD   Past Medical History:  Diagnosis Date  . Hypertension     Patient Active Problem List   Diagnosis Date Noted  . Hypertension 11/18/2019  . Enlarged lymph node 10/02/2019  . Subclinical hyperthyroidism 09/08/2019  . Subacute thyroiditis 07/12/2019  . Multinodular goiter 07/12/2019  . Low TSH level 07/12/2019  . Muscle pain 02/23/2018  . Lymphedema of left lower extremity 05/10/2017  . Hidradenitis suppurativa 03/18/2012  . Atopic dermatitis 01/21/2012  . Pain of right leg 01/21/2012    Past Surgical History:  Procedure Laterality Date  . foot sx Right   . leg sx Right      OB History   No obstetric history on file.     History reviewed. No pertinent family history.  Social History   Tobacco Use  . Smoking status: Former Smoker    Types: Cigarettes    Quit date: 07/06/2019    Years since quitting: 0.4  . Smokeless tobacco:  Never Used  Substance Use Topics  . Alcohol use: Yes    Comment: social   . Drug use: Never    Home Medications Prior to Admission medications   Medication Sig Start Date End Date Taking? Authorizing Provider  amLODipine (NORVASC) 5 MG tablet Take 1 tablet (5 mg total) by mouth daily. 10/23/19   Corum, Rex Kras, MD  cephALEXin (KEFLEX) 500 MG capsule Take 1 capsule (500 mg total) by mouth 3 (three) times daily. 12/16/19   Rolland Porter, MD  phenazopyridine (PYRIDIUM) 200 MG tablet Take 1 tablet (200 mg total) by mouth 3 (three) times daily. 12/16/19   Rolland Porter, MD  triamcinolone ointment (KENALOG) 0.5 % Apply topically. 04/28/17   [provider]  lisinopril (ZESTRIL) 5 MG tablet Take by mouth. 04/14/19 09/21/19  [provider]    Allergies    Latex and Other  Review of Systems   Review of Systems  All other systems reviewed and are negative.   Physical Exam Updated Vital Signs BP (!) 144/69 (BP Location: Right Arm)   Pulse 92   Temp 98.2 F (36.8 C) (Oral)   Resp 18   Ht 5\' 4"  (1.626 m)   Wt 94.3 kg   LMP 11/26/2019 (Approximate)   SpO2 100%   BMI 35.70 kg/m   Physical Exam Vitals and nursing note reviewed.  Constitutional:      Appearance: Normal appearance.  HENT:     Head: Normocephalic and atraumatic.     Right Ear: External ear normal.     Left Ear: External ear normal.  Eyes:     Extraocular Movements: Extraocular movements intact.     Pupils: Pupils are equal, round, and reactive to light.  Cardiovascular:     Rate and Rhythm: Normal rate.  Pulmonary:     Effort: Pulmonary effort is normal. No respiratory distress.  Abdominal:     General: Bowel sounds are normal.     Palpations: Abdomen is soft.     Tenderness: There is abdominal tenderness. There is no right CVA tenderness or left CVA tenderness.    Musculoskeletal:        General: Normal range of motion.     Cervical back: Normal range of motion.  Skin:    General: Skin is warm  and dry.     Findings: Rash present.     Comments: Patient has an area on the volar aspect of her right wrist with scaliness and dryness consistent with eczema.  Neurological:     General: No focal deficit present.     Mental Status: She is alert and oriented to person, place, and time.     Cranial Nerves: No cranial nerve deficit.  Psychiatric:        Mood and Affect: Mood normal.        Behavior: Behavior normal.        Thought Content: Thought content normal.     ED Results / Procedures / Treatments   Labs (all labs ordered are listed, but only abnormal results are displayed) Results for orders placed or performed during the hospital encounter of 12/15/19  Urinalysis, Routine w reflex microscopic  Result Value Ref Range   Color, Urine YELLOW YELLOW   APPearance CLOUDY (A) CLEAR   Specific Gravity, Urine 1.019 1.005 - 1.030   pH 6.0 5.0 - 8.0   Glucose, UA NEGATIVE NEGATIVE mg/dL   Hgb urine dipstick MODERATE (A) NEGATIVE   Bilirubin Urine NEGATIVE NEGATIVE   Ketones, ur NEGATIVE NEGATIVE mg/dL   Protein, ur 100 (A) NEGATIVE mg/dL   Nitrite NEGATIVE NEGATIVE   Leukocytes,Ua LARGE (A) NEGATIVE   RBC / HPF >50 (H) 0 - 5 RBC/hpf   WBC, UA >50 (H) 0 - 5 WBC/hpf   Bacteria, UA RARE (A) NONE SEEN   Squamous Epithelial / LPF 0-5 0 - 5   WBC Clumps PRESENT    Mucus PRESENT    Laboratory interpretation all normal except UTI, urine culture sent    EKG None  Radiology No results found.  Procedures Procedures (including critical care time)  Medications Ordered in ED Medications  cephALEXin (KEFLEX) capsule 500 mg (has no administration in time range)  phenazopyridine (PYRIDIUM) tablet 200 mg (has no administration in time range)    ED Course  I have reviewed the triage vital signs and the nursing notes.  Pertinent labs & imaging results that were available during my care of the patient were reviewed by me and considered in my medical decision making (see chart for  details).    MDM Rules/Calculators/A&P                      Patient has symptoms of a urinary tract infection, it sounds like she may be trying to ascend to her kidney although she does not have flank pain or right CVA  tenderness.  Consideration was made for appendicitis however her abdomen is soft and not consistent with that exam.  She should be rechecked if she gets fever, vomiting, or worsening abdominal pain.   Final Clinical Impression(s) / ED Diagnoses Final diagnoses:  Lower urinary tract infectious disease    Rx / DC Orders ED Discharge Orders         Ordered    cephALEXin (KEFLEX) 500 MG capsule  3 times daily     12/16/19 0015    phenazopyridine (PYRIDIUM) 200 MG tablet  3 times daily     12/16/19 0015         Plan discharge  Rolland Porter, MD, Barbette Or, MD 12/16/19 Benancio Deeds

## 2019-12-18 LAB — URINE CULTURE
Culture: 60000 — AB
Special Requests: NORMAL

## 2019-12-19 ENCOUNTER — Telehealth: Payer: Self-pay | Admitting: *Deleted

## 2019-12-19 NOTE — Telephone Encounter (Signed)
Post ED Visit - Positive Culture Follow-up  Culture report reviewed by antimicrobial stewardship pharmacist: [place] Team []  Nathan Batchelder, Pharm.D. []  [address], Pharm.D., BCPS AQ-ID []  Heide Guile, Pharm.D., BCPS []  Parks Neptune, Pharm.D., BCPS []  [place], Pharm.D., BCPS, AAHIVP []  [place], Pharm.D., BCPS, AAHIVP [x]  Legrand Como, PharmD, BCPS []  Salome Arnt, PharmD, BCPS []  Johnnette Gourd, PharmD, BCPS []  Hughes Better, PharmD []  Leeroy Cha, PharmD, BCPS []  Laqueta Linden, PharmD  [place] Team []  [address], PharmD []  Leodis Sias, PharmD []  Lindell Spar, PharmD []  Royetta Asal, Rph []  Graylin Shiver) Rema Fendt, PharmD []  Glennon Mac, PharmD []  Arlyn Dunning, PharmD []  Netta Cedars, PharmD []  Dia Sitter, PharmD []  Leone Haven, PharmD []  Gretta Arab, PharmD []  Theodis Shove, PharmD []  Peggyann Juba, PharmD   Positive urine culture Treated with Cephalexin, organism sensitive to the same and no further patient follow-up is required at this time.  Reuel Boom [facility] 12/19/2019, 10:18 AM

## 2019-12-27 ENCOUNTER — Ambulatory Visit: Payer: BC Managed Care – PPO | Admitting: Pain Medicine

## 2020-01-09 ENCOUNTER — Ambulatory Visit: Admission: RE | Admit: 2020-01-09 | Payer: BC Managed Care – PPO | Source: Ambulatory Visit

## 2020-01-09 ENCOUNTER — Other Ambulatory Visit: Payer: Self-pay

## 2020-01-19 DIAGNOSIS — M5416 Radiculopathy, lumbar region: Secondary | ICD-10-CM | POA: Diagnosis not present

## 2020-01-19 DIAGNOSIS — M79606 Pain in leg, unspecified: Secondary | ICD-10-CM | POA: Diagnosis not present

## 2020-02-27 ENCOUNTER — Encounter: Payer: Self-pay | Admitting: Family Medicine

## 2020-02-27 ENCOUNTER — Ambulatory Visit (INDEPENDENT_AMBULATORY_CARE_PROVIDER_SITE_OTHER): Payer: BC Managed Care – PPO | Admitting: Family Medicine

## 2020-02-27 ENCOUNTER — Other Ambulatory Visit: Payer: Self-pay

## 2020-02-27 VITALS — BP 132/80 | HR 94 | Temp 97.7°F | Ht 64.0 in | Wt 214.6 lb

## 2020-02-27 DIAGNOSIS — I10 Essential (primary) hypertension: Secondary | ICD-10-CM

## 2020-02-27 MED ORDER — AMLODIPINE BESYLATE 5 MG PO TABS: 5.0000 mg | ORAL_TABLET | Freq: Every day | ORAL | 1 refills | Status: DC

## 2020-02-27 NOTE — Progress Notes (Signed)
Established Patient Office Visit  Subjective:  Patient ID: Gina Coleman, female    DOB: 1976/06/05  Age: 44 y.o. MRN: [id number]  CC:  Chief Complaint  Patient presents with  . Follow-up    f/u on blood pressure     HPI [name] presents for HTN- Norvas daily, no headaches, no visual change labwork reviewed  Past Medical History:  Diagnosis Date  . Hypertension     Past Surgical History:  Procedure Laterality Date  . foot sx Right   . leg sx Right     No family history on file.  Social History   Socioeconomic History  . Marital status: Married    Spouse name: Not on file  . Number of children: Not on file  . Years of education: Not on file  . Highest education level: Not on file  Occupational History  . Not on file  Tobacco Use  . Smoking status: Former Smoker    Types: Cigarettes    Quit date: 07/06/2019    Years since quitting: 0.6  . Smokeless tobacco: Never Used  Substance and Sexual Activity  . Alcohol use: Yes    Comment: social   . Drug use: Never  . Sexual activity: Not on file  Other Topics Concern  . Not on file  Social History Narrative  . Not on file   Social Determinants of Health   Financial Resource Strain:   . Difficulty of Paying Living Expenses:   Food Insecurity:   . Worried About Charity fundraiser in the Last Year:   . Arboriculturist in the Last Year:   Transportation Needs:   . Film/video editor (Medical):   Marland Kitchen Lack of Transportation (Non-Medical):   Physical Activity:   . Days of Exercise per Week:   . Minutes of Exercise per Session:   Stress:   . Feeling of Stress :   Social Connections:   . Frequency of Communication with Friends and Family:   . Frequency of Social Gatherings with Friends and Family:   . Attends Religious Services:   . Active Member of Clubs or Organizations:   . Attends Archivist Meetings:   Marland Kitchen Marital Status:   Intimate Partner Violence:   . Fear of Current or  Ex-Partner:   . Emotionally Abused:   Marland Kitchen Physically Abused:   . Sexually Abused:     Outpatient Medications Prior to Visit  Medication Sig Dispense Refill  . amLODipine (NORVASC) 5 MG tablet Take 1 tablet (5 mg total) by mouth daily. 90 tablet 3  . cephALEXin (KEFLEX) 500 MG capsule Take 1 capsule (500 mg total) by mouth 3 (three) times daily. 30 capsule 0  . phenazopyridine (PYRIDIUM) 200 MG tablet Take 1 tablet (200 mg total) by mouth 3 (three) times daily. 6 tablet 0  . triamcinolone ointment (KENALOG) 0.5 % Apply topically.     No facility-administered medications prior to visit.    Allergies  Allergen Reactions  . Latex   . Other     Other reaction(s): Other GLOVE POWDER = RASH GLOVE POWDER = RASH GLOVE POWDER = RASH     ROS Review of Systems  Constitutional: Negative.   Respiratory: Negative.   Cardiovascular: Negative.  Negative for leg swelling.  Neurological: Negative for headaches.      Objective:    Physical Exam  Constitutional: She is oriented to person, place, and time. She appears well-developed and well-nourished.  HENT:  Head: Normocephalic  and atraumatic.  Eyes: Conjunctivae are normal.  Cardiovascular: Normal rate and regular rhythm.  Pulmonary/Chest: Effort normal and breath sounds normal.  Neurological: She is oriented to person, place, and time.  Psychiatric: She has a normal mood and affect. Her behavior is normal.    BP 132/80 (BP Location: Right Arm, Patient Position: Sitting, Cuff Size: Large)   Pulse 94   Temp 97.7 F (36.5 C) (Temporal)   Ht 5\' 4"  (1.626 m)   Wt 214 lb 9.6 oz (97.3 kg)   SpO2 99%   BMI 36.84 kg/m  Wt Readings from Last 3 Encounters:  02/27/20 214 lb 9.6 oz (97.3 kg)  12/15/19 208 lb (94.3 kg)  10/23/19 208 lb 6.4 oz (94.5 kg)     Health Maintenance Due  Topic Date Due  . HIV Screening  Never done  . TETANUS/TDAP  Never done  . PAP SMEAR-Modifier  Never done  . INFLUENZA VACCINE  Never done    Lab  Results  Component Value Date   TSH 0.14 (L) 11/14/2019   Lab Results  Component Value Date   WBC 5.5 10/03/2019   HGB 13.1 10/03/2019   HCT 40.8 10/03/2019   MCV 90.7 10/03/2019   PLT 248 10/03/2019   Lab Results  Component Value Date   NA 138 11/22/2019   K 4.0 11/22/2019   CO2 24 11/22/2019   GLUCOSE 119 11/22/2019   BUN 10 11/22/2019   CREATININE 0.73 11/22/2019   BILITOT 0.5 10/30/2019   AST 14 10/30/2019   ALT 10 10/30/2019   PROT 7.5 10/30/2019   CALCIUM 8.9 11/22/2019   ANIONGAP 8 07/05/2019   Lab Results  Component Value Date   CHOL 198 10/30/2019   Lab Results  Component Value Date   HDL 58 10/30/2019   Lab Results  Component Value Date   LDLCALC 126 (H) 10/30/2019   Lab Results  Component Value Date   TRIG 52 10/30/2019   Lab Results  Component Value Date   CHOLHDL 3.4 10/30/2019      Assessment & Plan:   Problem List Items Addressed This Visit      Cardiovascular and Mediastinum   Hypertension - Primary      Follow-up: bmp, tsh   Joselin Crandell Hannah Beat, MD

## 2020-02-27 NOTE — Patient Instructions (Signed)
° ° ° °  If you have lab work done today you will be contacted with your lab results within the next 2 weeks.  If you have not heard from us then please contact us. The fastest way to get your results is to register for My Chart. ° ° °IF you received an x-ray today, you will receive an invoice from [facility]. Please contact [facility] at [phone] with questions or concerns regarding your invoice.  ° °IF you received labwork today, you will receive an invoice from [facility]. Please contact [facility] at [phone] with questions or concerns regarding your invoice.  ° °Our billing staff will not be able to assist you with questions regarding bills from these companies. ° °You will be contacted with the lab results as soon as they are available. The fastest way to get your results is to activate your My Chart account. Instructions are located on the last page of this paperwork. If you have not heard from us regarding the results in 2 weeks, please contact this office. °  ° ° ° °

## 2020-03-05 ENCOUNTER — Other Ambulatory Visit: Payer: Self-pay

## 2020-03-05 ENCOUNTER — Ambulatory Visit
Admission: RE | Admit: 2020-03-05 | Discharge: 2020-03-05 | Disposition: A | Discharge status: Home / Self Care | Payer: BC Managed Care – PPO | Source: Ambulatory Visit | Attending: Otolaryngology | Admitting: Otolaryngology

## 2020-03-05 DIAGNOSIS — M542 Cervicalgia: Secondary | ICD-10-CM

## 2020-03-05 DIAGNOSIS — E041 Nontoxic single thyroid nodule: Secondary | ICD-10-CM | POA: Diagnosis not present

## 2020-03-05 DIAGNOSIS — R590 Localized enlarged lymph nodes: Secondary | ICD-10-CM

## 2020-03-05 MED ORDER — GADOBENATE DIMEGLUMINE 529 MG/ML IV SOLN
20.0000 mL | Freq: Once | INTRAVENOUS | Status: AC | PRN
  Administered 2020-03-05: 16:00:00 20 mL via INTRAVENOUS

## 2020-03-14 DIAGNOSIS — M545 Low back pain: Secondary | ICD-10-CM | POA: Diagnosis not present

## 2020-03-14 DIAGNOSIS — M5416 Radiculopathy, lumbar region: Secondary | ICD-10-CM | POA: Diagnosis not present

## 2020-03-28 ENCOUNTER — Encounter: Payer: Self-pay | Admitting: Emergency Medicine

## 2020-03-28 ENCOUNTER — Other Ambulatory Visit: Payer: Self-pay

## 2020-03-28 ENCOUNTER — Ambulatory Visit
Admission: EM | Admit: 2020-03-28 | Discharge: 2020-03-28 | Disposition: A | Discharge status: Home / Self Care | Payer: BC Managed Care – PPO

## 2020-03-28 DIAGNOSIS — M545 Low back pain, unspecified: Secondary | ICD-10-CM

## 2020-03-28 MED ORDER — METHYLPREDNISOLONE SODIUM SUCC 125 MG IJ SOLR
125.0000 mg | Freq: Once | INTRAMUSCULAR | Status: AC
  Administered 2020-03-28: 13:00:00 125 mg via INTRAMUSCULAR

## 2020-03-28 MED ORDER — IBUPROFEN 800 MG PO TABS: 800.0000 mg | ORAL_TABLET | Freq: Three times a day (TID) | ORAL | 0 refills | Status: DC

## 2020-03-28 MED ORDER — CYCLOBENZAPRINE HCL 7.5 MG PO TABS: 7.5000 mg | ORAL_TABLET | Freq: Three times a day (TID) | ORAL | 0 refills | Status: DC | PRN

## 2020-03-28 MED ORDER — KETOROLAC TROMETHAMINE 60 MG/2ML IM SOLN
60.0000 mg | Freq: Once | INTRAMUSCULAR | Status: AC
  Administered 2020-03-28: 13:00:00 60 mg via INTRAMUSCULAR

## 2020-03-28 MED ORDER — PREDNISONE 10 MG (21) PO TBPK: ORAL_TABLET | ORAL | 0 refills | Status: DC

## 2020-03-28 NOTE — ED Provider Notes (Addendum)
[facility]    CSN: [id number] Arrival date & time: 03/28/20  1157      History   Chief Complaint Chief Complaint  Patient presents with  . Back Pain    HPI Gina Coleman is a 44 y.o. female.   Who presented to the urgent care with a complaint of low back pain that started this morning.  Reports she was sitting all day yesterday while braiding her hair.  Localized pain to bilateral lower back.  Described the pain as constant and achy, rated at 10 on a scale of 1-10.  He has tried OTC medications without relief.  His symptoms are made worse with ROM.  He denies similar symptoms in the past.  Denies chills, nausea, vomiting, diarrhea, confusion, paresthesia.  The history is provided by the patient. No language interpreter was used.  Back Pain   Past Medical History:  Diagnosis Date  . Hypertension     Patient Active Problem List   Diagnosis Date Noted  . Hypertension 11/18/2019  . Enlarged lymph node 10/02/2019  . Subclinical hyperthyroidism 09/08/2019  . Subacute thyroiditis 07/12/2019  . Multinodular goiter 07/12/2019  . Low TSH level 07/12/2019  . Muscle pain 02/23/2018  . Lymphedema of left lower extremity 05/10/2017  . Hidradenitis suppurativa 03/18/2012  . Atopic dermatitis 01/21/2012  . Pain of right leg 01/21/2012    Past Surgical History:  Procedure Laterality Date  . foot sx Right   . leg sx Right     OB History   No obstetric history on file.      Home Medications    Prior to Admission medications   Medication Sig Start Date End Date Taking? Authorizing Provider  GABAPENTIN PO Take by mouth.   Yes [provider]  amLODipine (NORVASC) 5 MG tablet Take 1 tablet (5 mg total) by mouth daily. 02/27/20   Corum, Rex Kras, MD  cyclobenzaprine (FEXMID) 7.5 MG tablet Take 1 tablet (7.5 mg total) by mouth 3 (three) times daily as needed for muscle spasms. 03/28/20   Randel Hargens, Darrelyn Hillock, FNP  ibuprofen (ADVIL) 800 MG tablet Take 1  tablet (800 mg total) by mouth 3 (three) times daily. Take with food 03/28/20   Kainoah Bartosiewicz, Darrelyn Hillock, FNP  predniSONE (STERAPRED UNI-PAK 21 TAB) 10 MG (21) TBPK tablet Take 6 tabs by mouth daily  for 2 days, then 5 tabs for 2 days, then 4 tabs for 2 days, then 3 tabs for 2 days, 2 tabs for 2 days, then 1 tab by mouth daily for 2 days 03/28/20   Emerson Monte, FNP  lisinopril (ZESTRIL) 5 MG tablet Take by mouth. 04/14/19 09/21/19  [provider]    Family History History reviewed. No pertinent family history.  Social History Social History   Tobacco Use  . Smoking status: Former Smoker    Types: Cigarettes    Quit date: 07/06/2019    Years since quitting: 0.7  . Smokeless tobacco: Never Used  Substance Use Topics  . Alcohol use: Yes    Comment: social   . Drug use: Never     Allergies   Latex and Other   Review of Systems Review of Systems  Constitutional: Negative.   Respiratory: Negative.   Cardiovascular: Negative.   Musculoskeletal: Positive for back pain.  Neurological: Negative.   All other systems reviewed and are negative.    Physical Exam Triage Vital Signs ED Triage Vitals  Enc Vitals Group     BP 03/28/20 1210 Marland Kitchen)  144/80     Pulse Rate 03/28/20 1210 97     Resp 03/28/20 1210 (!) 22     Temp 03/28/20 1210 99 F (37.2 C)     Temp Source 03/28/20 1210 Oral     SpO2 03/28/20 1210 96 %     Weight --      Height --      Head Circumference --      Peak Flow --      Pain Score 03/28/20 1206 10     Pain Loc --      Pain Edu? --      Excl. in [place]? --    No data found.  Updated Vital Signs BP (!) 144/80 (BP Location: Right Arm)   Pulse 97   Temp 99 F (37.2 C) (Oral)   Resp (!) 22   LMP 03/08/2020   SpO2 96%   Visual Acuity Right Eye Distance:   Left Eye Distance:   Bilateral Distance:    Right Eye Near:   Left Eye Near:    Bilateral Near:     Physical Exam Vitals and nursing note reviewed.  Constitutional:      General: She is  not in acute distress.    Appearance: Normal appearance. She is normal weight. She is not ill-appearing, toxic-appearing or diaphoretic.  Cardiovascular:     Rate and Rhythm: Normal rate and regular rhythm.     Pulses: Normal pulses.     Heart sounds: Normal heart sounds. No murmur. No friction rub. No gallop.   Pulmonary:     Effort: Pulmonary effort is normal. No respiratory distress.     Breath sounds: Normal breath sounds. No stridor. No wheezing, rhonchi or rales.  Chest:     Chest wall: No tenderness.  Musculoskeletal:        General: Tenderness present. No swelling or signs of injury.     Cervical back: Normal.     Thoracic back: Normal.     Lumbar back: Spasms and tenderness present.     Comments: Back:  Patient ambulates from chair to exam table with pain inspection: Skin clear and intact without obvious swelling, erythema, or ecchymosis. Warm to the touch  Palpation: Vertebral processes nontender. Tenderness about the lower bilateral back ROM: FROM Strength: 5/5 hip flexion, 5/5 knee extension, 5/5 knee flexion,   Special Tests: Negative Straight leg raise  Neurological:     General: No focal deficit present.     Mental Status: She is alert and oriented to person, place, and time.     Cranial Nerves: No cranial nerve deficit.     Sensory: No sensory deficit.     Motor: No weakness.     Coordination: Coordination normal.     Gait: Gait normal.     Deep Tendon Reflexes: Reflexes normal.      UC Treatments / Results  Labs (all labs ordered are listed, but only abnormal results are displayed) Labs Reviewed - No data to display  EKG   Radiology No results found.  Procedures Procedures (including critical care time)  Medications Ordered in UC Medications  ketorolac (TORADOL) injection 60 mg (has no administration in time range)  methylPREDNISolone sodium succinate (SOLU-MEDROL) 125 mg/2 mL injection 125 mg (has no administration in time range)    Initial  Impression / Assessment and Plan / UC Course  I have reviewed the triage vital signs and the nursing notes.  Pertinent labs & imaging results that were available during my  care of the patient were reviewed by me and considered in my medical decision making (see chart for details).    Patient stable for discharge.  Solu-Medrol and Toradol IM were given in office.  Was advised to take medication as prescribed.  To follow-up with PCP.  To return or go to ED for worsening symptoms.  Final Clinical Impressions(s) / UC Diagnoses   Final diagnoses:  Acute bilateral low back pain without sciatica     Discharge Instructions     Rest, ice and heat as needed Ensure adequate ROM as tolerated. Prescribed ibuprofen as needed for  pain relief Prescribed prednisone for inflammation/start this medication tomorrow Prescribed flexeril  for muscle spasm.  Do not drive or operate heavy machinery while taking this medication Return here or go to ER if you have any new or worsening symptoms such as numbness/tingling of the inner thighs, loss of bladder or bowel control, headache/blurry vision, nausea/vomiting, confusion/altered mental status, dizziness, weakness, passing out, imbalance, etc...      ED Prescriptions    Medication Sig Dispense Auth. Provider   predniSONE (STERAPRED UNI-PAK 21 TAB) 10 MG (21) TBPK tablet Take 6 tabs by mouth daily  for 2 days, then 5 tabs for 2 days, then 4 tabs for 2 days, then 3 tabs for 2 days, 2 tabs for 2 days, then 1 tab by mouth daily for 2 days 42 tablet Rayma Hegg S, FNP   cyclobenzaprine (FEXMID) 7.5 MG tablet Take 1 tablet (7.5 mg total) by mouth 3 (three) times daily as needed for muscle spasms. 30 tablet Kadejah Sandiford S, FNP   ibuprofen (ADVIL) 800 MG tablet Take 1 tablet (800 mg total) by mouth 3 (three) times daily. Take with food 30 tablet Elijio Staples, Darrelyn Hillock, FNP     PDMP not reviewed this encounter.      Emerson Monte, [place] 03/28/20 1245

## 2020-03-28 NOTE — ED Triage Notes (Signed)
Mid back pain, pain is on right and left .  No radiation up or down back.  Patient reports this pain started this morning.  Patient spent the majority of yesterday braiding her hair.  No pain then, but today has pain

## 2020-03-28 NOTE — Discharge Instructions (Addendum)
Rest, ice and heat as needed Ensure adequate ROM as tolerated. Prescribed ibuprofen as needed for  pain relief Prescribed prednisone for inflammation/start this medication tomorrow Prescribed flexeril  for muscle spasm.  Do not drive or operate heavy machinery while taking this medication Return here or go to ER if you have any new or worsening symptoms such as numbness/tingling of the inner thighs, loss of bladder or bowel control, headache/blurry vision, nausea/vomiting, confusion/altered mental status, dizziness, weakness, passing out, imbalance, etc..Marland Kitchen

## 2020-04-04 LAB — BASIC METABOLIC PANEL
BUN: 10 mg/dL (ref 7–25)
CO2: 26 mmol/L (ref 20–32)
Calcium: 9.2 mg/dL (ref 8.6–10.2)
Chloride: 107 mmol/L (ref 98–110)
Creat: 0.71 mg/dL (ref 0.50–1.10)
Glucose, Bld: 130 mg/dL (ref 65–139)
Potassium: 4 mmol/L (ref 3.5–5.3)
Sodium: 139 mmol/L (ref 135–146)

## 2020-04-04 LAB — TSH: TSH: 0.06 mIU/L — ABNORMAL LOW

## 2020-04-12 ENCOUNTER — Other Ambulatory Visit: Payer: Self-pay

## 2020-04-16 ENCOUNTER — Ambulatory Visit: Payer: BC Managed Care – PPO | Admitting: Internal Medicine

## 2020-04-16 ENCOUNTER — Other Ambulatory Visit: Payer: Self-pay

## 2020-04-16 ENCOUNTER — Encounter: Payer: Self-pay | Admitting: Internal Medicine

## 2020-04-16 VITALS — BP 116/72 | HR 79 | Temp 98.2°F | Ht 64.0 in | Wt 216.2 lb

## 2020-04-16 DIAGNOSIS — E0590 Thyrotoxicosis, unspecified without thyrotoxic crisis or storm: Secondary | ICD-10-CM | POA: Diagnosis not present

## 2020-04-16 DIAGNOSIS — E042 Nontoxic multinodular goiter: Secondary | ICD-10-CM | POA: Diagnosis not present

## 2020-04-16 MED ORDER — METHIMAZOLE 5 MG PO TABS: 10.0000 mg | ORAL_TABLET | Freq: Every day | ORAL | 6 refills | Status: DC

## 2020-04-16 NOTE — Patient Instructions (Signed)
We recommend that you follow these hyperthyroidism instructions at home:  1) Take Methimazole 5 mg TWO tablets  a day  If you develop severe sore throat with high fevers OR develop unexplained yellowing of your skin, eyes, under your tongue, severe abdominal pain with nausea or vomiting --> then please get evaluated immediately.  2) Get repeat thyroid labs 6 weeks .   It is ESSENTIAL to get follow-up labs to help avoid over or undertreatment of your hyperthyroidism - both of which can be dangerous to your health.    

## 2020-04-16 NOTE — Progress Notes (Signed)
Name: Gina Coleman  MRN/ DOB: [id number], 17-Nov-1976    Age/ Sex: 44 y.o., female     PCP: Maryruth Hancock, MD   Reason for Endocrinology Evaluation: MNG     Initial Endocrinology Clinic Visit: 07/12/2019    PATIENT IDENTIFIER: Gina Coleman is a 44 y.o., female with a past medical history of HTN. She has followed with [facility] since 07/12/2019 for consultative assistance with management of her 07/12/2019.   HISTORICAL SUMMARY: The patient was first  noted to have a low TSH at 0.399 uIU/mL during an evaluation for an enlarged cervical lymph node in 05/2019. She was started on Abx at the time.  Repeat TFT's were normal in 07/2019 . Ultrasound showed MNG . She is S/P benign FNA of the RUP nodule 08/2019   TRAb was undetectable   No FH of thyroid disorder  SUBJECTIVE:   During last visit (09/07/2019): Repeat labs showed normal TFT's   Today (04/16/2020):  Gina Coleman is here for a follow up on subclinical hyperthyroidism. S/P FNA of the RUP nodule with benign cytology in 08/2019   Denies local neck symptoms  Weight has been stable but has worsening anxiety and occasional palpitations No diarrhea  Denies constipation  Worsening anxiety   Menstruations has been regular      ROS:  As per HPI.   HISTORY:  Past Medical History:  Past Medical History:  Diagnosis Date  . Hypertension    Past Surgical History:  Past Surgical History:  Procedure Laterality Date  . foot sx Right   . leg sx Right     Social History:  reports that she quit smoking about 9 months ago. Her smoking use included cigarettes. She has never used smokeless tobacco. She reports current alcohol use. She reports that she does not use drugs.  Family History: No family history on file.   HOME MEDICATIONS: Allergies as of 04/16/2020      Reactions   Latex    Other    Other reaction(s): Other GLOVE POWDER = RASH GLOVE POWDER = RASH GLOVE POWDER = RASH      Medication  List       Accurate as of Apr 16, 2020  2:10 PM. If you have any questions, ask your nurse or doctor.        amLODipine 5 MG tablet Commonly known as: NORVASC Take 1 tablet (5 mg total) by mouth daily.   cyclobenzaprine 7.5 MG tablet Commonly known as: FEXMID Take 1 tablet (7.5 mg total) by mouth 3 (three) times daily as needed for muscle spasms.   GABAPENTIN PO Take by mouth.   ibuprofen 800 MG tablet Commonly known as: ADVIL Take 1 tablet (800 mg total) by mouth 3 (three) times daily. Take with food   methimazole 5 MG tablet Commonly known as: TAPAZOLE Take 2 tablets (10 mg total) by mouth daily. Started by: Dorita Sciara, MD   predniSONE 10 MG (21) Tbpk tablet Commonly known as: STERAPRED UNI-PAK 21 TAB Take 6 tabs by mouth daily  for 2 days, then 5 tabs for 2 days, then 4 tabs for 2 days, then 3 tabs for 2 days, 2 tabs for 2 days, then 1 tab by mouth daily for 2 days         OBJECTIVE:   PHYSICAL EXAM: VS: BP 116/72 (BP Location: Left Arm, Patient Position: Sitting, Cuff Size: Large)   Pulse 79   Temp 98.2 F (36.8 C)   Ht 5'  4" (1.626 m)   Wt 216 lb 3.2 oz (98.1 kg)   LMP 04/06/2020   SpO2 99%   BMI 37.11 kg/m    EXAM: General: Pt appears well and is in NAD  Neck: General: Supple without adenopathy. Thyroid: Right thyroid  nodule appreciated.   Lungs: Clear with good BS bilat with no rales, rhonchi, or wheezes  Heart: Auscultation: RRR.  Abdomen: Normoactive bowel sounds, soft, nontender, without masses or organomegaly palpable  Extremities:  BL LE: No pretibial edema normal ROM and strength.  Skin: Hair: Texture and amount normal with gender appropriate distribution Skin Inspection: No rashes Skin Palpation: Skin temperature, texture, and thickness normal to palpation  Neuro: Cranial nerves: II - XII grossly intact  Motor: Normal strength throughout DTRs: 2+ and symmetric in UE without delay in relaxation phase  Mental Status: Judgment,  insight: Intact Orientation: Oriented to time, place, and person Mood and affect: No depression, anxiety, or agitation     DATA REVIEWED: Results for Gina Coleman, Gina Coleman (MRN [id number]) as of 04/16/2020 14:11  Ref. Range 11/14/2019 10:23 04/03/2020 15:16  TSH Latest Units: mIU/L 0.14 (L) 0.06 (L)  T4,Free(Direct) Latest Ref Range: 0.60 - 1.60 ng/dL 0.98     Thyroid Ultrasound 07/12/2019   Nodule # 1:  Location: Right; Superior  Maximum size: 3.9 cm; Other 2 dimensions: 2.8 x 1.4 cm  Composition: solid/almost completely solid (2)  Echogenicity: hypoechoic (2)  Shape: not taller-than-wide (0)  Margins: smooth (0)  Echogenic foci: none (0)  ACR TI-RADS total points: 4.  ACR TI-RADS risk category: TR4 (4-6 points).  ACR TI-RADS recommendations:  **Given size (>/= 1.5 cm) and appearance, fine needle aspiration of this moderately suspicious nodule should be considered based on TI-RADS criteria.  _________________________________________________________  Nodule # 2: 0.9 cm hypoechoic solid nodule in the right inferior gland. No further follow-up required.  _________________________________________________________  Nodule # 3: 0.9 cm hypoechoic solid nodule in the superior aspect of the thyroid isthmus. No further follow-up required.  _________________________________________________________  Nodule # 4:  Location: Isthmus; Inferior  Maximum size: 1.9 cm; Other 2 dimensions: 1.7 x 1.1 cm  Composition: solid/almost completely solid (2)  Echogenicity: isoechoic (1)  Shape: not taller-than-wide (0)  Margins: ill-defined (0)  Echogenic foci: none (0)  ACR TI-RADS total points: 3.  ACR TI-RADS risk category: TR3 (3 points).  ACR TI-RADS recommendations:  *Given size (>/= 1.5 - 2.4 cm) and appearance, a follow-up ultrasound in 1 year should be considered based on TI-RADS  criteria.  _________________________________________________________  IMPRESSION: 1. Diffusely enlarged, heterogeneous and multinodular thyroid gland. 2. The 3.9 cm TI-RADS category 4 nodule (labeled # 1) in the deep aspect of the right superior gland meets criteria for consideration of fine-needle aspiration biopsy. 3. The 1.9 cm TI-RADS category 3 nodule (labeled # 4) in the inferior aspect of the thyroid isthmus meets criteria for follow-up ultrasound in 1 year. 4. Additional small subcentimeter thyroid nodules do not meet criteria for further evaluation.  FNA (08/02/2019)   THYROID, FINE NEEDLE ASPIRATION, RUP (SPECIMEN 1 OF 1, COLLECTED 08/02/19): CONSISTENT WITH BENIGN FOLLICULAR NODULE (BETHESDA CATEGORY II).  ASSESSMENT / PLAN / RECOMMENDATIONS:   1. Multinodular Goiter :   - No local neck symptoms  - S/P Benign FNA of the Right upper nodule 08/2019 - Will repeat ultrasound 07/2020    2. Subclinical Hyperthyroidism :   - Pt with non-specific symptoms that could be attributed to her thyroid  - Most likely secondary to autonomous nodule  - We  discussed with pt the benefits of methimazole in the Tx of hyperthyroidism, as well as the possible side effects/complications of anti-thyroid drug Tx (specifically detailing the rare, but serious side effect of agranulocytosis). She was informed of need for regular thyroid function monitoring while on methimazole to ensure appropriate dosage without over-treatment. As well, we discussed the possible side effects of methimazole including the chance of rash, the small chance of liver irritation/juandice and the <=1 in 300-400 chance of sudden onset agranulocytosis.  We discussed importance of going to ED promptly (and stopping methimazole) if shewere to develop significant fever with severe sore throat of other evidence of acute infection.       We extensively discussed the various treatment options for hyperthyroidism and Graves disease  including ablation therapy with radioactive iodine versus antithyroid drug treatment versus surgical therapy.  We recommended to the patient that we felt, at this time, that thinamide therapy would be most optimal.  We discussed the various possible benefits versus side effects of the various therapies.   I carefully explained to the patient that one of the consequences of I-131 ablation treatment would likely be permanent hypothyroidism which would require long-term replacement therapy with LT4.    Medication: Methimazole 5 mg ,2 tablets daily    Labs in 6 weeks   F/U in 3 months     Signed electronically by: Mack Guise, MD  [facility]  [facility] [address] [place], [place] [address] Phone: [phone] FAX: [phone]      CC: Maryruth Hancock, [place] [address] Phone: [phone]  Fax: [phone]   Return to Endocrinology clinic as below: Future Appointments  Date Time Provider [place]  05/28/2020 11:00 AM [facility] None  07/22/2020 10:10 AM Renley Banwart, Melanie Crazier, MD [facility] None

## 2020-05-13 ENCOUNTER — Telehealth: Payer: Self-pay | Admitting: Internal Medicine

## 2020-05-13 ENCOUNTER — Other Ambulatory Visit: Payer: Self-pay

## 2020-05-13 ENCOUNTER — Other Ambulatory Visit (INDEPENDENT_AMBULATORY_CARE_PROVIDER_SITE_OTHER): Payer: BC Managed Care – PPO

## 2020-05-13 DIAGNOSIS — E0590 Thyrotoxicosis, unspecified without thyrotoxic crisis or storm: Secondary | ICD-10-CM

## 2020-05-13 NOTE — Telephone Encounter (Signed)
Please advise 

## 2020-05-13 NOTE — Telephone Encounter (Signed)
Patient requests to be called at ph# [phone] re: Patient states since the second week after starting Methimazole she is having discomfort and abdominal pain on her left side.

## 2020-05-13 NOTE — Telephone Encounter (Signed)
Pt scheduled for labs today at 3:30

## 2020-05-14 LAB — COMPREHENSIVE METABOLIC PANEL
ALT: 28 U/L (ref 0–35)
AST: 20 U/L (ref 0–37)
Albumin: 4 g/dL (ref 3.5–5.2)
Alkaline Phosphatase: 126 U/L — ABNORMAL HIGH (ref 39–117)
BUN: 12 mg/dL (ref 6–23)
CO2: 29 mEq/L (ref 19–32)
Calcium: 9.1 mg/dL (ref 8.4–10.5)
Chloride: 105 mEq/L (ref 96–112)
Creatinine, Ser: 0.88 mg/dL (ref 0.40–1.20)
GFR: 69.56 mL/min (ref >60.00)
Glucose, Bld: 90 mg/dL (ref 70–99)
Potassium: 3.6 mEq/L (ref 3.5–5.1)
Sodium: 138 mEq/L (ref 135–145)
Total Bilirubin: 0.5 mg/dL (ref 0.2–1.2)
Total Protein: 7.4 g/dL (ref 6.0–8.3)

## 2020-05-14 LAB — T4, FREE: Free T4: 0.87 ng/dL (ref 0.60–1.60)

## 2020-05-14 LAB — TSH: TSH: 0.13 u[IU]/mL — ABNORMAL LOW (ref 0.35–4.50)

## 2020-05-17 ENCOUNTER — Encounter: Payer: Self-pay | Admitting: Internal Medicine

## 2020-05-17 ENCOUNTER — Other Ambulatory Visit: Payer: Self-pay

## 2020-05-17 ENCOUNTER — Ambulatory Visit (INDEPENDENT_AMBULATORY_CARE_PROVIDER_SITE_OTHER): Payer: BC Managed Care – PPO | Admitting: Internal Medicine

## 2020-05-17 VITALS — BP 123/84 | HR 100 | Temp 99.0°F | Resp 16 | Ht 64.0 in | Wt 214.0 lb

## 2020-05-17 DIAGNOSIS — R252 Cramp and spasm: Secondary | ICD-10-CM

## 2020-05-17 DIAGNOSIS — R52 Pain, unspecified: Secondary | ICD-10-CM

## 2020-05-17 DIAGNOSIS — M5441 Lumbago with sciatica, right side: Secondary | ICD-10-CM | POA: Diagnosis not present

## 2020-05-17 DIAGNOSIS — I10 Essential (primary) hypertension: Secondary | ICD-10-CM

## 2020-05-17 DIAGNOSIS — Z7689 Persons encountering health services in other specified circumstances: Secondary | ICD-10-CM | POA: Diagnosis not present

## 2020-05-17 DIAGNOSIS — E0590 Thyrotoxicosis, unspecified without thyrotoxic crisis or storm: Secondary | ICD-10-CM

## 2020-05-17 DIAGNOSIS — G8929 Other chronic pain: Secondary | ICD-10-CM

## 2020-05-17 DIAGNOSIS — Z1231 Encounter for screening mammogram for malignant neoplasm of breast: Secondary | ICD-10-CM

## 2020-05-17 LAB — POCT URINALYSIS DIP (CLINITEK)
Bilirubin, UA: NEGATIVE
Blood, UA: NEGATIVE
Glucose, UA: NEGATIVE mg/dL
Ketones, POC UA: NEGATIVE mg/dL
Leukocytes, UA: NEGATIVE
Nitrite, UA: NEGATIVE
POC PROTEIN,UA: NEGATIVE
Spec Grav, UA: >=1.03 — AB (ref 1.010–1.025)
Urobilinogen, UA: 0.2 E.U./dL
pH, UA: 6.5 (ref 5.0–8.0)

## 2020-05-17 MED ORDER — DICYCLOMINE HCL 10 MG PO CAPS: 10.0000 mg | ORAL_CAPSULE | Freq: Three times a day (TID) | ORAL | 0 refills | Status: DC

## 2020-05-17 NOTE — Patient Instructions (Addendum)
Check UA Follow up in 1 month for pap smear Call if no improvement in pain.

## 2020-05-17 NOTE — Progress Notes (Signed)
New Patient Office Visit  Subjective:  Patient ID: Gina Coleman, female    DOB: [birth year]  Age: 44 y.o. MRN: [id number]  CC:  Chief Complaint  Patient presents with  . New Patient (Initial Visit)    establish care    HPI Gina Coleman is a very pleasant 44 year old female with past medical history of hypertension, multinodular goiter presents in our clinic for the first time to establish care with [place].  Patient tells me that she is doing fine however since 1 week she has left-sided crampy intermittent abdominal pain, 4 out of 10, nonradiating, no aggravating or relieving factors, denies association with nausea, vomiting, diarrhea, constipation, decreased appetite, weakness or lethargy.  LMP: 05/05/2020, regular cycles.  She is sexually active but denies history of STDs or vaginal discharge.  She denies any urinary symptoms such as dysuria, hematuria, change in urinary frequency or foul-smelling urine.  She had UTI in the past which she was treated with appropriate antibiotics.  She is followed by endocrinologist for multinodular goiter/hyperthyroidism.  She takes methimazole 10 mg daily.  She takes amlodipine 5 mg once daily for hypertension.  Gabapentin and Flexeril for chronic back pain with sciatica.  No history of diverticulitis/melena or hematemesis.  No history of headache, blurry vision, chest pain, shortness of breath, palpitation, leg swelling, fever, chills, cough or congestion.  No history of smoking, alcohol, drinks alcohol occasionally.  She is due for Pap smear and mammogram.  Past Medical History:  Diagnosis Date  . Hypertension   . Hyperthyroidism     Past Surgical History:  Procedure Laterality Date  . foot sx Right   . leg sx Right     Family History  Problem Relation Age of Onset  . Hypertension Sister   . Breast cancer Maternal Grandmother     Social History   Socioeconomic History  . Marital status: Married    Spouse name: Not on file  .  Number of children: Not on file  . Years of education: Not on file  . Highest education level: Not on file  Occupational History  . Not on file  Tobacco Use  . Smoking status: Former Smoker    Types: Cigarettes    Quit date: 07/06/2019    Years since quitting: 0.8  . Smokeless tobacco: Never Used  Substance and Sexual Activity  . Alcohol use: Yes    Comment: social   . Drug use: Never  . Sexual activity: Not on file  Other Topics Concern  . Not on file  Social History Narrative  . Not on file   Social Determinants of Health   Financial Resource Strain:   . Difficulty of Paying Living Expenses:   Food Insecurity:   . Worried About Charity fundraiser in the Last Year:   . Arboriculturist in the Last Year:   Transportation Needs:   . Film/video editor (Medical):   Marland Kitchen Lack of Transportation (Non-Medical):   Physical Activity:   . Days of Exercise per Week:   . Minutes of Exercise per Session:   Stress:   . Feeling of Stress :   Social Connections:   . Frequency of Communication with Friends and Family:   . Frequency of Social Gatherings with Friends and Family:   . Attends Religious Services:   . Active Member of Clubs or Organizations:   . Attends Archivist Meetings:   Marland Kitchen Marital Status:   Intimate Partner Violence:   . Fear  of Current or Ex-Partner:   . Emotionally Abused:   Marland Kitchen Physically Abused:   . Sexually Abused:     ROS Review of Systems  Constitutional: Negative.   HENT: Negative.   Eyes: Negative.   Respiratory: Negative.   Cardiovascular: Negative.   Gastrointestinal: Positive for abdominal pain.  Endocrine: Negative.   Genitourinary: Negative.   Musculoskeletal: Negative.   Allergic/Immunologic: Negative.   Neurological: Negative.   Hematological: Negative.   Psychiatric/Behavioral: Negative.     Objective:   Today's Vitals: BP 123/84   Pulse 100   Temp 99 F (37.2 C) (Temporal)   Resp 16   Ht 5\' 4"  (1.626 m)   Wt 214 lb  0.6 oz (97.1 kg)   SpO2 97%   BMI 36.74 kg/m   Physical Exam Constitutional:      Appearance: Normal appearance. She is obese.  HENT:     Head: Normocephalic and atraumatic.     Nose: Nose normal.     Mouth/Throat:     Mouth: Mucous membranes are moist.  Eyes:     Extraocular Movements: Extraocular movements intact.     Conjunctiva/sclera: Conjunctivae normal.     Pupils: Pupils are equal, round, and reactive to light.  Cardiovascular:     Rate and Rhythm: Normal rate and regular rhythm.     Pulses: Normal pulses.     Heart sounds: Normal heart sounds.  Pulmonary:     Effort: Pulmonary effort is normal.     Breath sounds: Normal breath sounds.  Abdominal:     General: Bowel sounds are normal.     Palpations: Abdomen is soft.     Comments: Mild left abdominal tenderness positive.  No guarding, no rigidity, no hepatosplenomegaly.  Musculoskeletal:        General: Normal range of motion.     Cervical back: Normal range of motion and neck supple.  Neurological:     General: No focal deficit present.     Mental Status: She is alert and oriented to person, place, and time.  Psychiatric:        Mood and Affect: Mood normal.        Behavior: Behavior normal.        Thought Content: Thought content normal.        Judgment: Judgment normal.     Assessment & Plan:   Problem List Items Addressed This Visit      Cardiovascular and Mediastinum   Hypertension    Other Visit Diagnoses    Encounter to establish care    -  Primary   Hyperthyroidism       Chronic bilateral low back pain with right-sided sciatica       Relevant Medications   gabapentin (NEURONTIN) 100 MG capsule   Crampy pain       Relevant Orders   POCT URINALYSIS DIP (CLIN[facility]K)   Screening mammogram, encounter for       Relevant Orders   MM 3D SCREEN BREAST BILATERAL   Morbid obesity ([place])         Encounter to establish care: Patient's care established.  Left-sided abdominal pain: Crampy -UA is  negative for infection.  No association with nausea, vomiting, diarrhea, constipation, decreased appetite, weakness, melena or hematemesis. -Trial of Bentyl. -Encourage increased fluid intake.   -Regular cycles.  LMP: 05/05/2020.  Had tubal ligation.  Call next week if symptoms persist or get worse.  Hypertension: Well-controlled.  Continue amlodipine 5 mg once daily.  Advised DASH diet,  exercise and weight loss  Multinodular goiter/hyperthyroidism: Takes methimazole 10 mg once daily.  Followed by endocrinology outpatient.  Morbid obesity with BMI of 36: Discussed about dietary modification, exercise and weight loss.  Encounter for screening mammogram: Patient referred for screening mammogram.  Chronic lower back pain associated with sciatica: Stable.  Continue Flexeril and gabapentin.  Low up in 1 month for Pap smear.  Outpatient Encounter Medications as of 05/17/2020  Medication Sig  . amLODipine (NORVASC) 5 MG tablet Take 1 tablet (5 mg total) by mouth daily.  Marland Kitchen gabapentin (NEURONTIN) 100 MG capsule Take 100 mg by mouth 2 (two) times daily.  . methimazole (TAPAZOLE) 5 MG tablet Take 2 tablets (10 mg total) by mouth daily.  Marland Kitchen dicyclomine (BENTYL) 10 MG capsule Take 1 capsule (10 mg total) by mouth 4 (four) times daily -  before meals and at bedtime.  . [DISCONTINUED] cyclobenzaprine (FEXMID) 7.5 MG tablet Take 1 tablet (7.5 mg total) by mouth 3 (three) times daily as needed for muscle spasms. (Patient not taking: Reported on 04/16/2020)  . [DISCONTINUED] GABAPENTIN PO Take by mouth.  . [DISCONTINUED] ibuprofen (ADVIL) 800 MG tablet Take 1 tablet (800 mg total) by mouth 3 (three) times daily. Take with food (Patient not taking: Reported on 04/16/2020)  . [DISCONTINUED] lisinopril (ZESTRIL) 5 MG tablet Take by mouth.   No facility-administered encounter medications on file as of 05/17/2020.    Follow-up: Return in about 1 month (around 06/16/2020).   Mckinley Jewel, MD

## 2020-05-20 DIAGNOSIS — M5416 Radiculopathy, lumbar region: Secondary | ICD-10-CM | POA: Diagnosis not present

## 2020-05-24 ENCOUNTER — Other Ambulatory Visit: Payer: Self-pay | Admitting: Family Medicine

## 2020-05-24 DIAGNOSIS — Z1231 Encounter for screening mammogram for malignant neoplasm of breast: Secondary | ICD-10-CM

## 2020-05-28 ENCOUNTER — Other Ambulatory Visit: Payer: BC Managed Care – PPO

## 2020-05-31 ENCOUNTER — Ambulatory Visit
Admission: RE | Admit: 2020-05-31 | Discharge: 2020-05-31 | Disposition: A | Discharge status: Home / Self Care | Payer: BC Managed Care – PPO | Source: Ambulatory Visit | Attending: Family Medicine | Admitting: Family Medicine

## 2020-05-31 ENCOUNTER — Other Ambulatory Visit: Payer: Self-pay

## 2020-05-31 DIAGNOSIS — Z1231 Encounter for screening mammogram for malignant neoplasm of breast: Secondary | ICD-10-CM

## 2020-06-05 ENCOUNTER — Other Ambulatory Visit: Payer: Self-pay | Admitting: Family Medicine

## 2020-06-05 DIAGNOSIS — R928 Other abnormal and inconclusive findings on diagnostic imaging of breast: Secondary | ICD-10-CM

## 2020-06-14 ENCOUNTER — Ambulatory Visit (INDEPENDENT_AMBULATORY_CARE_PROVIDER_SITE_OTHER): Payer: BC Managed Care – PPO | Admitting: Internal Medicine

## 2020-06-14 ENCOUNTER — Other Ambulatory Visit (HOSPITAL_COMMUNITY)
Admission: RE | Admit: 2020-06-14 | Discharge: 2020-06-14 | Disposition: A | Discharge status: Home / Self Care | Payer: BC Managed Care – PPO | Source: Ambulatory Visit | Attending: Internal Medicine | Admitting: Internal Medicine

## 2020-06-14 ENCOUNTER — Other Ambulatory Visit: Payer: Self-pay

## 2020-06-14 ENCOUNTER — Ambulatory Visit: Payer: BC Managed Care – PPO | Admitting: Internal Medicine

## 2020-06-14 ENCOUNTER — Encounter: Payer: Self-pay | Admitting: Internal Medicine

## 2020-06-14 VITALS — BP 135/85 | HR 90 | Resp 16 | Ht 64.0 in | Wt 216.0 lb

## 2020-06-14 DIAGNOSIS — R928 Other abnormal and inconclusive findings on diagnostic imaging of breast: Secondary | ICD-10-CM | POA: Diagnosis not present

## 2020-06-14 DIAGNOSIS — G8929 Other chronic pain: Secondary | ICD-10-CM

## 2020-06-14 DIAGNOSIS — I10 Essential (primary) hypertension: Secondary | ICD-10-CM

## 2020-06-14 DIAGNOSIS — M549 Dorsalgia, unspecified: Secondary | ICD-10-CM | POA: Insufficient documentation

## 2020-06-14 DIAGNOSIS — Z01419 Encounter for gynecological examination (general) (routine) without abnormal findings: Secondary | ICD-10-CM | POA: Diagnosis not present

## 2020-06-14 DIAGNOSIS — Z124 Encounter for screening for malignant neoplasm of cervix: Secondary | ICD-10-CM

## 2020-06-14 DIAGNOSIS — E0590 Thyrotoxicosis, unspecified without thyrotoxic crisis or storm: Secondary | ICD-10-CM | POA: Diagnosis not present

## 2020-06-14 DIAGNOSIS — M5440 Lumbago with sciatica, unspecified side: Secondary | ICD-10-CM

## 2020-06-14 NOTE — Patient Instructions (Addendum)
Follow up in 3 months Cont. Same medicine. Pap smear done today

## 2020-06-14 NOTE — Progress Notes (Signed)
Established Patient Office Visit  Subjective:  Patient ID: Gina Coleman, female    DOB: [birth year]  Age: 44 y.o. MRN: [id number]  CC:  Chief Complaint  Patient presents with  . Annual Exam    HPI Gina Coleman is very pleasant 44 year old female with past medical history of hypertension, multinodular goiter, presents now clinic for screening Pap smear.  Patient denies vaginal discharge, irregular bleeding, previous history of STDs, previous history of abnormal Pap smears.  She recently had a screening mammogram which came back positive for mass and left breast which needs further evaluation.  Patient is scheduled for repeat diagnostic mammogram and ultrasound breast on 06/17/2020.  Patient tells me that she is nervous about the mass.  She is tearful.  Denies decreased appetite, weight loss or night sweats.  Her blood pressure is 135/85 today.  She is compliant with amlodipine 5 mg once daily.  She denies headache, blurry vision, lightheadedness, dizziness, chest pain, shortness of breath, palpitation, leg swelling.  Denies urinary or bowel changes.  She takes methimazole 10 mg daily.  She is followed by endocrinologist-has a scheduled appointment for follow-up and repeat labs with endocrinology in August.  She takes gabapentin and Flexeril for chronic back pain.  She denies worsening symptoms, numbness tingling sensation in bilateral lower extremities.  No history of smoking, alcohol, listed drug use.  Past Medical History:  Diagnosis Date  . Hypertension   . Hyperthyroidism     Past Surgical History:  Procedure Laterality Date  . foot sx Right   . leg sx Right     Family History  Problem Relation Age of Onset  . Hypertension Sister   . Breast cancer Maternal Grandmother     Social History   Socioeconomic History  . Marital status: Married    Spouse name: Not on file  . Number of children: Not on file  . Years of education: Not on file  . Highest education  level: Not on file  Occupational History  . Not on file  Tobacco Use  . Smoking status: Former Smoker    Types: Cigarettes    Quit date: 07/06/2019    Years since quitting: 0.9  . Smokeless tobacco: Never Used  Substance and Sexual Activity  . Alcohol use: Yes    Comment: social   . Drug use: Never  . Sexual activity: Not on file  Other Topics Concern  . Not on file  Social History Narrative  . Not on file   Social Determinants of Health   Financial Resource Strain:   . Difficulty of Paying Living Expenses:   Food Insecurity:   . Worried About Charity fundraiser in the Last Year:   . Arboriculturist in the Last Year:   Transportation Needs:   . Film/video editor (Medical):   Marland Kitchen Lack of Transportation (Non-Medical):   Physical Activity:   . Days of Exercise per Week:   . Minutes of Exercise per Session:   Stress:   . Feeling of Stress :   Social Connections:   . Frequency of Communication with Friends and Family:   . Frequency of Social Gatherings with Friends and Family:   . Attends Religious Services:   . Active Member of Clubs or Organizations:   . Attends Archivist Meetings:   Marland Kitchen Marital Status:   Intimate Partner Violence:   . Fear of Current or Ex-Partner:   . Emotionally Abused:   Marland Kitchen Physically Abused:   .  Sexually Abused:     Outpatient Medications Prior to Visit  Medication Sig Dispense Refill  . amLODipine (NORVASC) 5 MG tablet Take 1 tablet (5 mg total) by mouth daily. 90 tablet 1  . dicyclomine (BENTYL) 10 MG capsule Take 1 capsule (10 mg total) by mouth 4 (four) times daily -  before meals and at bedtime. 30 capsule 0  . gabapentin (NEURONTIN) 100 MG capsule Take 100 mg by mouth 2 (two) times daily.    . methimazole (TAPAZOLE) 5 MG tablet Take 2 tablets (10 mg total) by mouth daily. 60 tablet 6   No facility-administered medications prior to visit.    Allergies  Allergen Reactions  . Latex   . Other     Other reaction(s):  Other GLOVE POWDER = RASH GLOVE POWDER = RASH GLOVE POWDER = RASH     ROS Review of Systems  Constitutional: Negative.   HENT: Negative.   Eyes: Negative.   Respiratory: Negative.   Cardiovascular: Negative.   Gastrointestinal: Negative.   Endocrine: Negative.   Genitourinary: Negative.   Musculoskeletal: Negative.   Allergic/Immunologic: Negative.   Neurological: Negative.   Hematological: Negative.   Psychiatric/Behavioral: Negative.       Objective:    Physical Exam  BP 135/85   Pulse 90   Resp 16   Ht 5\' 4"  (1.626 m)   Wt 216 lb (98 kg)   SpO2 100%   BMI 37.08 kg/m  Wt Readings from Last 3 Encounters:  06/14/20 216 lb (98 kg)  05/17/20 214 lb 0.6 oz (97.1 kg)  04/16/20 216 lb 3.2 oz (98.1 kg)     Health Maintenance Due  Topic Date Due  . Hepatitis C Screening  Never done  . COVID-19 Vaccine (1) Never done  . HIV Screening  Never done  . TETANUS/TDAP  Never done  . PAP SMEAR-Modifier  Never done    There are no preventive care reminders to display for this patient.  Lab Results  Component Value Date   TSH 0.13 (L) 05/13/2020   Lab Results  Component Value Date   WBC 5.5 10/03/2019   HGB 13.1 10/03/2019   HCT 40.8 10/03/2019   MCV 90.7 10/03/2019   PLT 248 10/03/2019   Lab Results  Component Value Date   NA 138 05/13/2020   K 3.6 05/13/2020   CO2 29 05/13/2020   GLUCOSE 90 05/13/2020   BUN 12 05/13/2020   CREATININE 0.88 05/13/2020   BILITOT 0.5 05/13/2020   ALKPHOS 126 (H) 05/13/2020   AST 20 05/13/2020   ALT 28 05/13/2020   PROT 7.4 05/13/2020   ALBUMIN 4.0 05/13/2020   CALCIUM 9.1 05/13/2020   ANIONGAP 8 07/05/2019   GFR 69.56 05/13/2020   Lab Results  Component Value Date   CHOL 198 10/30/2019   Lab Results  Component Value Date   HDL 58 10/30/2019   Lab Results  Component Value Date   LDLCALC 126 (H) 10/30/2019   Lab Results  Component Value Date   TRIG 52 10/30/2019   Lab Results  Component Value Date    CHOLHDL 3.4 10/30/2019   No results found for: HGBA1C    Assessment & Plan:   Problem List Items Addressed This Visit      Cardiovascular and Mediastinum   Hypertension    Other Visit Diagnoses    Encounter for cervical Pap smear with pelvic exam    -  Primary   Relevant Orders   Cytology - PAP  Abnormal screening mammogram       Hyperthyroidism         Encounter for cervical Pap smear with pelvic exam: Pap smear done today.  Hypertension: Blood pressure slightly elevated 135/85 likely secondary to anxiety -Continue amlodipine 5 mg once daily.  Encourage increased fluid intake, DASH diet, exercise and weight loss.  Abnormal screening mammogram: -Reviewed mammogram and discussed with the patient. -She is scheduled for diagnostic mammogram and left breast ultrasound scheduled on 06/17/2020.  Hyperthyroidism/multinodular goiter: Stable -Continue methimazole 10 mg once daily.  She is scheduled to see endocrinology in August.  Chronic back pain: Stable on Flexeril and gabapentin.  No orders of the defined types were placed in this encounter.   Follow-up: No follow-ups on file.    Mckinley Jewel, MD

## 2020-06-17 ENCOUNTER — Other Ambulatory Visit: Payer: Self-pay

## 2020-06-17 ENCOUNTER — Other Ambulatory Visit: Payer: Self-pay | Admitting: Family Medicine

## 2020-06-17 ENCOUNTER — Ambulatory Visit
Admission: RE | Admit: 2020-06-17 | Discharge: 2020-06-17 | Disposition: A | Discharge status: Home / Self Care | Payer: BC Managed Care – PPO | Source: Ambulatory Visit | Attending: Family Medicine | Admitting: Family Medicine

## 2020-06-17 DIAGNOSIS — R928 Other abnormal and inconclusive findings on diagnostic imaging of breast: Secondary | ICD-10-CM

## 2020-06-17 DIAGNOSIS — N6321 Unspecified lump in the left breast, upper outer quadrant: Secondary | ICD-10-CM | POA: Diagnosis not present

## 2020-06-17 DIAGNOSIS — N6322 Unspecified lump in the left breast, upper inner quadrant: Secondary | ICD-10-CM | POA: Diagnosis not present

## 2020-06-17 DIAGNOSIS — N63 Unspecified lump in unspecified breast: Secondary | ICD-10-CM

## 2020-06-17 DIAGNOSIS — R922 Inconclusive mammogram: Secondary | ICD-10-CM | POA: Diagnosis not present

## 2020-06-18 LAB — CYTOLOGY - PAP
Comment: NEGATIVE
Diagnosis: NEGATIVE
High risk HPV: NEGATIVE

## 2020-07-03 ENCOUNTER — Ambulatory Visit: Payer: BC Managed Care – PPO | Admitting: Internal Medicine

## 2020-07-09 ENCOUNTER — Other Ambulatory Visit: Payer: Self-pay | Admitting: Internal Medicine

## 2020-07-09 ENCOUNTER — Ambulatory Visit: Payer: BC Managed Care – PPO | Admitting: Internal Medicine

## 2020-07-09 DIAGNOSIS — N63 Unspecified lump in unspecified breast: Secondary | ICD-10-CM

## 2020-07-22 ENCOUNTER — Other Ambulatory Visit: Payer: Self-pay

## 2020-07-22 ENCOUNTER — Ambulatory Visit (INDEPENDENT_AMBULATORY_CARE_PROVIDER_SITE_OTHER): Payer: BC Managed Care – PPO | Admitting: Internal Medicine

## 2020-07-22 VITALS — BP 122/72 | HR 74 | Wt 215.0 lb

## 2020-07-22 DIAGNOSIS — E0590 Thyrotoxicosis, unspecified without thyrotoxic crisis or storm: Secondary | ICD-10-CM | POA: Diagnosis not present

## 2020-07-22 DIAGNOSIS — E042 Nontoxic multinodular goiter: Secondary | ICD-10-CM | POA: Diagnosis not present

## 2020-07-22 LAB — T4, FREE: Free T4: 0.72 ng/dL (ref 0.60–1.60)

## 2020-07-22 LAB — TSH: TSH: 0.98 u[IU]/mL (ref 0.35–4.50)

## 2020-07-22 NOTE — Patient Instructions (Signed)
-   Continue methimazole 5 mg, 2 tablets for now

## 2020-07-22 NOTE — Progress Notes (Signed)
Name: Melisia Leming  MRN/ DOB: [id number], September 27, 1976    Age/ Sex: 44 y.o., female     PCP: Mckinley Jewel, MD   Reason for Endocrinology Evaluation: MNG     Initial Endocrinology Clinic Visit: 07/12/2019    PATIENT IDENTIFIER: Ms. Cherrelle Plante is a 44 y.o., female with a past medical history of HTN. She has followed with [facility] since 07/12/2019 for consultative assistance with management of her 07/12/2019.   HISTORICAL SUMMARY: The patient was first  noted to have a low TSH at 0.399 uIU/mL during an evaluation for an enlarged cervical lymph node in 05/2019. She was started on Abx at the time.  Repeat TFT's were normal in 07/2019 . Ultrasound showed MNG . She is S/P benign FNA of the RUP nodule 08/2019   TRAb was undetectable  Methimazole started 03/2020   No FH of thyroid disorder  SUBJECTIVE:    Today (07/22/2020):  Ms. Weimann is here for a follow up on subclinical hyperthyroidism. S/P FNA of the RUP nodule with benign cytology in 08/2019   Denies local neck symptoms   Denies stomach issues, fever or diarrhea.   Denies local neck symptoms  Anxiety is stable      ROS:  As per HPI.   HISTORY:  Past Medical History:  Past Medical History:  Diagnosis Date  . Hypertension   . Hyperthyroidism    Past Surgical History:  Past Surgical History:  Procedure Laterality Date  . foot sx Right   . leg sx Right     Social History:  reports that she quit smoking about 12 months ago. Her smoking use included cigarettes. She has never used smokeless tobacco. She reports current alcohol use. She reports that she does not use drugs. Family History:  Family History  Problem Relation Age of Onset  . Hypertension Sister   . Breast cancer Maternal Grandmother      HOME MEDICATIONS: Allergies as of 07/22/2020      Reactions   Latex    Other    Other reaction(s): Other GLOVE POWDER = RASH GLOVE POWDER = RASH GLOVE POWDER = RASH       Medication List       Accurate as of July 22, 2020  8:09 AM. If you have any questions, ask your nurse or doctor.        amLODipine 5 MG tablet Commonly known as: NORVASC Take 1 tablet (5 mg total) by mouth daily.   dicyclomine 10 MG capsule Commonly known as: Bentyl Take 1 capsule (10 mg total) by mouth 4 (four) times daily -  before meals and at bedtime.   gabapentin 100 MG capsule Commonly known as: NEURONTIN Take 100 mg by mouth 2 (two) times daily.   methimazole 5 MG tablet Commonly known as: TAPAZOLE Take 2 tablets (10 mg total) by mouth daily.         OBJECTIVE:   PHYSICAL EXAM: VS: There were no vitals taken for this visit.   EXAM: General: Pt appears well and is in NAD  Neck: General: Supple without adenopathy. Thyroid: Right thyroid  nodule appreciated.   Lungs: Clear with good BS bilat with no rales, rhonchi, or wheezes  Heart: Auscultation: RRR.  Abdomen: Normoactive bowel sounds, soft, nontender, without masses or organomegaly palpable  Extremities:  BL LE: No pretibial edema normal ROM and strength.  Skin: Hair: Texture and amount normal with gender appropriate distribution Skin Inspection: No rashes Skin Palpation: Skin temperature, texture, and  thickness normal to palpation  Neuro: Cranial nerves: II - XII grossly intact  Motor: Normal strength throughout DTRs: 2+ and symmetric in UE without delay in relaxation phase  Mental Status: Judgment, insight: Intact Orientation: Oriented to time, place, and person Mood and affect: No depression, anxiety, or agitation     DATA REVIEWED: Results for ZELPHIA, GLOVER (MRN [id number]) as of 04/16/2020 14:11  Ref. Range 11/14/2019 10:23 04/03/2020 15:16  TSH Latest Units: mIU/L 0.14 (L) 0.06 (L)  T4,Free(Direct) Latest Ref Range: 0.60 - 1.60 ng/dL 0.98     Thyroid Ultrasound 07/12/2019   Nodule # 1:  Location: Right; Superior  Maximum size: 3.9 cm; Other 2 dimensions: 2.8 x 1.4  cm  Composition: solid/almost completely solid (2)  Echogenicity: hypoechoic (2)  Shape: not taller-than-wide (0)  Margins: smooth (0)  Echogenic foci: none (0)  ACR TI-RADS total points: 4.  ACR TI-RADS risk category: TR4 (4-6 points).  ACR TI-RADS recommendations:  **Given size (>/= 1.5 cm) and appearance, fine needle aspiration of this moderately suspicious nodule should be considered based on TI-RADS criteria.  _________________________________________________________  Nodule # 2: 0.9 cm hypoechoic solid nodule in the right inferior gland. No further follow-up required.  _________________________________________________________  Nodule # 3: 0.9 cm hypoechoic solid nodule in the superior aspect of the thyroid isthmus. No further follow-up required.  _________________________________________________________  Nodule # 4:  Location: Isthmus; Inferior  Maximum size: 1.9 cm; Other 2 dimensions: 1.7 x 1.1 cm  Composition: solid/almost completely solid (2)  Echogenicity: isoechoic (1)  Shape: not taller-than-wide (0)  Margins: ill-defined (0)  Echogenic foci: none (0)  ACR TI-RADS total points: 3.  ACR TI-RADS risk category: TR3 (3 points).  ACR TI-RADS recommendations:  *Given size (>/= 1.5 - 2.4 cm) and appearance, a follow-up ultrasound in 1 year should be considered based on TI-RADS criteria.  _________________________________________________________  IMPRESSION: 1. Diffusely enlarged, heterogeneous and multinodular thyroid gland. 2. The 3.9 cm TI-RADS category 4 nodule (labeled # 1) in the deep aspect of the right superior gland meets criteria for consideration of fine-needle aspiration biopsy. 3. The 1.9 cm TI-RADS category 3 nodule (labeled # 4) in the inferior aspect of the thyroid isthmus meets criteria for follow-up ultrasound in 1 year. 4. Additional small subcentimeter thyroid nodules do not meet criteria for  further evaluation.  FNA (08/02/2019)   THYROID, FINE NEEDLE ASPIRATION, RUP (SPECIMEN 1 OF 1, COLLECTED 08/02/19): CONSISTENT WITH BENIGN FOLLICULAR NODULE (BETHESDA CATEGORY II).  ASSESSMENT / PLAN / RECOMMENDATIONS:   1. Multinodular Goiter :   - No local neck symptoms  - S/P Benign FNA of the Right upper nodule 08/2019 - A repeat thyroid ultrasound has been ordered     2. Hyperthyroidism :   -Pt is clinically and biochemically euthyroid  - She was treated because she was symptomatic  - Most likely secondary to autonomous nodule  - We extensively discussed the various treatment options for hyperthyroidism and Graves disease including ablation therapy with radioactive iodine versus antithyroid drug treatment versus surgical therapy.  We recommended to the patient that we felt, at this time, that thionamide therapy would be most optimal.  We discussed the various possible benefits versus side effects of the various therapies.   I carefully explained to the patient that one of the consequences of I-131 ablation treatment would likely be permanent hypothyroidism which would require long-term replacement therapy with LT4.    Medication: Methimazole 10 mg  Daily     F/U in 4 months  Signed electronically by: Mack Guise, MD  [facility]  [facility] [address] [place], [place] [address] Phone: [phone] FAX: [phone]      CC: Mckinley Jewel, [place] [address] Phone: [phone]  Fax: [phone]   Return to Endocrinology clinic as below: Future Appointments  Date Time Provider [place]  07/22/2020 10:10 AM Tan Clopper, Melanie Crazier, MD [facility] None  12/19/2020  2:40 PM [facility] DIAG [place] 1 [facility]  12/19/2020  2:50 PM [facility] [place] 1 [facility]

## 2020-07-23 MED ORDER — METHIMAZOLE 10 MG PO TABS: 10.0000 mg | ORAL_TABLET | Freq: Every day | ORAL | 1 refills | Status: DC

## 2020-08-07 ENCOUNTER — Ambulatory Visit
Admission: RE | Admit: 2020-08-07 | Discharge: 2020-08-07 | Disposition: A | Discharge status: Home / Self Care | Payer: BC Managed Care – PPO | Source: Ambulatory Visit | Attending: Internal Medicine | Admitting: Internal Medicine

## 2020-08-07 DIAGNOSIS — E041 Nontoxic single thyroid nodule: Secondary | ICD-10-CM | POA: Diagnosis not present

## 2020-08-07 DIAGNOSIS — E042 Nontoxic multinodular goiter: Secondary | ICD-10-CM

## 2020-08-22 ENCOUNTER — Ambulatory Visit (INDEPENDENT_AMBULATORY_CARE_PROVIDER_SITE_OTHER): Payer: BC Managed Care – PPO | Admitting: Internal Medicine

## 2020-08-22 ENCOUNTER — Other Ambulatory Visit: Payer: Self-pay

## 2020-08-22 ENCOUNTER — Encounter: Payer: Self-pay | Admitting: Internal Medicine

## 2020-08-22 VITALS — BP 118/78 | HR 93 | Resp 16 | Ht 64.0 in | Wt 215.0 lb

## 2020-08-22 DIAGNOSIS — N946 Dysmenorrhea, unspecified: Secondary | ICD-10-CM | POA: Insufficient documentation

## 2020-08-22 DIAGNOSIS — E042 Nontoxic multinodular goiter: Secondary | ICD-10-CM | POA: Diagnosis not present

## 2020-08-22 DIAGNOSIS — I10 Essential (primary) hypertension: Secondary | ICD-10-CM | POA: Diagnosis not present

## 2020-08-22 DIAGNOSIS — G609 Hereditary and idiopathic neuropathy, unspecified: Secondary | ICD-10-CM

## 2020-08-22 DIAGNOSIS — E0590 Thyrotoxicosis, unspecified without thyrotoxic crisis or storm: Secondary | ICD-10-CM | POA: Diagnosis not present

## 2020-08-22 DIAGNOSIS — G629 Polyneuropathy, unspecified: Secondary | ICD-10-CM | POA: Insufficient documentation

## 2020-08-22 DIAGNOSIS — R928 Other abnormal and inconclusive findings on diagnostic imaging of breast: Secondary | ICD-10-CM

## 2020-08-22 NOTE — Assessment & Plan Note (Signed)
On Methimazole 10 mg QD Follows Endocrinology Last TSH and T4 wnl

## 2020-08-22 NOTE — Assessment & Plan Note (Signed)
Well-controlled BP: 118/78 On Amlodipine 5 mg QD Advised to follow DASH diet and perform moderate exercise as tolerated

## 2020-08-22 NOTE — Assessment & Plan Note (Signed)
On Methimazole 10 mg QD Follows Endocrinology

## 2020-08-22 NOTE — Assessment & Plan Note (Addendum)
C/o abdominal discomfort worse during menstruation F/u US Pelvis, CBC Recent PAP smear negative Advised Ibuprofen as needed for menstrual cramps

## 2020-08-22 NOTE — Assessment & Plan Note (Signed)
Controlled with Gabapentin

## 2020-08-22 NOTE — Patient Instructions (Addendum)
You are advised to get Ultrasound Pelvis done.  Okay to take Tylenol or Ibuprofen for pain during menstruation. Please get medical attention in case excessive bleeding associated with pain.  Continue to take medications as prescribed.  Follow up after 8 weeks. Please get blood tests done before next visit.

## 2020-08-22 NOTE — Progress Notes (Signed)
Established Patient Office Visit  Subjective:  Patient ID: Gina Coleman, female    DOB: Mar 04, 1976  Age: 44 y.o. MRN: [id number]  CC:  Chief Complaint  Patient presents with  . Establish Care    previously established with pahwani   . Abdominal Pain    has been having off and on left lower abdominal pain. More of an ache     HPI Gina Coleman is a 44 year old female with past medical history of hypertension, multinodular goiter, subclinical hyperthyroidism and abnormal mammogram presents for follow-up of her chronic conditions.  Patient says that she still has left-sided abdominal pain, which she describes as intermittent, 3-5 out of 10, worse during menstrual periods and did not relieved with Bentyl which was prescribed last time.  Patient denies excessive bleeding during menstruation, noticing any clots, constipation, diarrhea, nausea, vomiting, dysuria, hematuria, or difficulty urinating.  In this visit, I discussed her mammography and Pap smear test results and next follow-up mammography.  Past Medical History:  Diagnosis Date  . Hypertension   . Hyperthyroidism     Past Surgical History:  Procedure Laterality Date  . foot sx Right   . leg sx Right     Family History  Problem Relation Age of Onset  . Hypertension Sister   . Breast cancer Maternal Grandmother     Social History   Socioeconomic History  . Marital status: Married    Spouse name: Not on file  . Number of children: Not on file  . Years of education: Not on file  . Highest education level: Not on file  Occupational History  . Not on file  Tobacco Use  . Smoking status: Former Smoker    Types: Cigarettes    Quit date: 07/06/2019    Years since quitting: 1.1  . Smokeless tobacco: Never Used  Substance and Sexual Activity  . Alcohol use: Yes    Comment: social   . Drug use: Never  . Sexual activity: Not on file  Other Topics Concern  . Not on file  Social History Narrative  . Not on  file   Social Determinants of Health   Financial Resource Strain:   . Difficulty of Paying Living Expenses: Not on file  Food Insecurity:   . Worried About Charity fundraiser in the Last Year: Not on file  . Ran Out of Food in the Last Year: Not on file  Transportation Needs:   . Lack of Transportation (Medical): Not on file  . Lack of Transportation (Non-Medical): Not on file  Physical Activity:   . Days of Exercise per Week: Not on file  . Minutes of Exercise per Session: Not on file  Stress:   . Feeling of Stress : Not on file  Social Connections:   . Frequency of Communication with Friends and Family: Not on file  . Frequency of Social Gatherings with Friends and Family: Not on file  . Attends Religious Services: Not on file  . Active Member of Clubs or Organizations: Not on file  . Attends Archivist Meetings: Not on file  . Marital Status: Not on file  Intimate Partner Violence:   . Fear of Current or Ex-Partner: Not on file  . Emotionally Abused: Not on file  . Physically Abused: Not on file  . Sexually Abused: Not on file    Outpatient Medications Prior to Visit  Medication Sig Dispense Refill  . amLODipine (NORVASC) 5 MG tablet Take 1 tablet (5 mg total)  by mouth daily. 90 tablet 1  . gabapentin (NEURONTIN) 100 MG capsule Take 100 mg by mouth 2 (two) times daily.    . methimazole (TAPAZOLE) 10 MG tablet Take 1 tablet (10 mg total) by mouth daily. 90 tablet 1  . dicyclomine (BENTYL) 10 MG capsule Take 1 capsule (10 mg total) by mouth 4 (four) times daily -  before meals and at bedtime. (Patient not taking: Reported on 08/22/2020) 30 capsule 0   No facility-administered medications prior to visit.    Allergies  Allergen Reactions  . Latex   . Other     Other reaction(s): Other GLOVE POWDER = RASH GLOVE POWDER = RASH GLOVE POWDER = RASH     ROS Review of Systems  Constitutional: Negative for chills and fever.  HENT: Negative for congestion, sinus  pressure, sinus pain and sore throat.   Eyes: Negative for pain and discharge.  Respiratory: Negative for cough and shortness of breath.   Cardiovascular: Negative for chest pain and palpitations.  Gastrointestinal: Negative for constipation, diarrhea, nausea and vomiting.       Abdominal discomfort  Endocrine: Negative for polydipsia and polyuria.  Genitourinary: Negative for dysuria and hematuria.  Musculoskeletal: Negative for neck pain and neck stiffness.  Skin: Negative for rash.  Neurological: Negative for dizziness and weakness.  Psychiatric/Behavioral: Negative for agitation and behavioral problems.      Objective:    Physical Exam Vitals reviewed.  Constitutional:      General: She is not in acute distress.    Appearance: She is not diaphoretic.  HENT:     Head: Normocephalic and atraumatic.     Nose: Nose normal. No congestion.     Mouth/Throat:     Mouth: Mucous membranes are moist.     Pharynx: No posterior oropharyngeal erythema.  Eyes:     General: No scleral icterus.    Extraocular Movements: Extraocular movements intact.     Pupils: Pupils are equal, round, and reactive to light.  Cardiovascular:     Rate and Rhythm: Normal rate and regular rhythm.     Heart sounds: No murmur heard.   Pulmonary:     Breath sounds: Normal breath sounds. No wheezing or rales.  Abdominal:     Palpations: Abdomen is soft.     Tenderness: There is abdominal tenderness (Mild LLQ). There is no guarding or rebound.  Musculoskeletal:     Cervical back: Neck supple. No tenderness.     Right lower leg: No edema.     Left lower leg: No edema.  Skin:    General: Skin is warm.     Findings: No rash.  Neurological:     General: No focal deficit present.     Mental Status: She is alert and oriented to person, place, and time.  Psychiatric:        Mood and Affect: Mood normal.        Behavior: Behavior normal.      BP 118/78   Pulse 93   Resp 16   Ht 5\' 4"  (1.626 m)   Wt  215 lb (97.5 kg)   SpO2 97%   BMI 36.90 kg/m  Wt Readings from Last 3 Encounters:  08/22/20 215 lb (97.5 kg)  07/22/20 215 lb (97.5 kg)  06/14/20 216 lb (98 kg)     Health Maintenance Due  Topic Date Due  . Hepatitis C Screening  Never done  . HIV Screening  Never done  . TETANUS/TDAP  Never done  .  INFLUENZA VACCINE  Never done    There are no preventive care reminders to display for this patient.  Lab Results  Component Value Date   TSH 0.98 07/22/2020   Lab Results  Component Value Date   WBC 5.5 10/03/2019   HGB 13.1 10/03/2019   HCT 40.8 10/03/2019   MCV 90.7 10/03/2019   PLT 248 10/03/2019   Lab Results  Component Value Date   NA 138 05/13/2020   K 3.6 05/13/2020   CO2 29 05/13/2020   GLUCOSE 90 05/13/2020   BUN 12 05/13/2020   CREATININE 0.88 05/13/2020   BILITOT 0.5 05/13/2020   ALKPHOS 126 (H) 05/13/2020   AST 20 05/13/2020   ALT 28 05/13/2020   PROT 7.4 05/13/2020   ALBUMIN 4.0 05/13/2020   CALCIUM 9.1 05/13/2020   ANIONGAP 8 07/05/2019   GFR 69.56 05/13/2020   Lab Results  Component Value Date   CHOL 198 10/30/2019   Lab Results  Component Value Date   HDL 58 10/30/2019   Lab Results  Component Value Date   LDLCALC 126 (H) 10/30/2019   Lab Results  Component Value Date   TRIG 52 10/30/2019   Lab Results  Component Value Date   CHOLHDL 3.4 10/30/2019   No results found for: HGBA1C    Assessment & Plan:   Dysmenorrhea, unspecified C/o abdominal discomfort worse during menstruation F/u US Pelvis, CBC Recent PAP smear negative Advised Ibuprofen as needed for menstrual cramps  Hypertension Well-controlled BP: 118/78 On Amlodipine 5 mg QD Advised to follow DASH diet and perform moderate exercise as tolerated  Multinodular goiter On Methimazole 10 mg QD Follows Endocrinology  Subclinical hyperthyroidism On Methimazole 10 mg QD Follows Endocrinology Last TSH and T4 wnl  Abnormal mammogram 3 likely benign masses in  the left breast (05/2020) Advised for repeat Mammogram in next 4 months  Peripheral neuropathy Controlled with Gabapentin  Obesity Discussed diet modification and moderate exercise Patient expressed understanding and agreed to it.  Patient denied flu vaccine despite explaining the benefits.  No orders of the defined types were placed in this encounter.   Follow-up: Return in about 8 weeks (around 10/17/2020), or if symptoms worsen or fail to improve.    Lindell Spar, MD

## 2020-08-22 NOTE — Assessment & Plan Note (Signed)
3 likely benign masses in the left breast (05/2020) Advised for repeat Mammogram in next 4 months

## 2020-08-27 ENCOUNTER — Other Ambulatory Visit: Payer: Self-pay | Admitting: Internal Medicine

## 2020-08-27 DIAGNOSIS — N946 Dysmenorrhea, unspecified: Secondary | ICD-10-CM | POA: Diagnosis not present

## 2020-08-28 LAB — CBC
HCT: 40 % (ref 35.0–45.0)
Hemoglobin: 12.8 g/dL (ref 11.7–15.5)
MCH: 28.3 pg (ref 27.0–33.0)
MCHC: 32 g/dL (ref 32.0–36.0)
MCV: 88.3 fL (ref 80.0–100.0)
MPV: 11.5 fL (ref 7.5–12.5)
Platelets: 255 10*3/uL (ref 140–400)
RBC: 4.53 10*6/uL (ref 3.80–5.10)
RDW: 12.4 % (ref 11.0–15.0)
WBC: 4.7 10*3/uL (ref 3.8–10.8)

## 2020-08-28 LAB — BASIC METABOLIC PANEL WITH GFR
BUN: 8 mg/dL (ref 7–25)
CO2: 24 mmol/L (ref 20–32)
Calcium: 9.1 mg/dL (ref 8.6–10.2)
Chloride: 105 mmol/L (ref 98–110)
Creat: 0.61 mg/dL (ref 0.50–1.10)
GFR, Est African American: 127 mL/min/{1.73_m2} (ref >=60)
GFR, Est Non African American: 109 mL/min/{1.73_m2} (ref >=60)
Glucose, Bld: 95 mg/dL (ref 65–139)
Potassium: 4.1 mmol/L (ref 3.5–5.3)
Sodium: 138 mmol/L (ref 135–146)

## 2020-09-02 ENCOUNTER — Other Ambulatory Visit: Payer: Self-pay

## 2020-09-02 ENCOUNTER — Ambulatory Visit (HOSPITAL_COMMUNITY)
Admission: RE | Admit: 2020-09-02 | Discharge: 2020-09-02 | Disposition: A | Discharge status: Home / Self Care | Payer: BC Managed Care – PPO | Source: Ambulatory Visit | Attending: Internal Medicine | Admitting: Internal Medicine

## 2020-09-02 DIAGNOSIS — N920 Excessive and frequent menstruation with regular cycle: Secondary | ICD-10-CM | POA: Diagnosis not present

## 2020-09-02 DIAGNOSIS — D259 Leiomyoma of uterus, unspecified: Secondary | ICD-10-CM | POA: Diagnosis not present

## 2020-09-02 DIAGNOSIS — N946 Dysmenorrhea, unspecified: Secondary | ICD-10-CM | POA: Diagnosis not present

## 2020-09-03 ENCOUNTER — Other Ambulatory Visit: Payer: Self-pay

## 2020-09-03 DIAGNOSIS — D259 Leiomyoma of uterus, unspecified: Secondary | ICD-10-CM

## 2020-09-08 ENCOUNTER — Emergency Department (HOSPITAL_COMMUNITY): Payer: BC Managed Care – PPO

## 2020-09-08 ENCOUNTER — Encounter (HOSPITAL_COMMUNITY): Payer: Self-pay | Admitting: Emergency Medicine

## 2020-09-08 ENCOUNTER — Other Ambulatory Visit: Payer: Self-pay

## 2020-09-08 ENCOUNTER — Emergency Department (HOSPITAL_COMMUNITY)
Admission: EM | Admit: 2020-09-08 | Discharge: 2020-09-09 | Disposition: A | Discharge status: Home / Self Care | Payer: BC Managed Care – PPO | Attending: Emergency Medicine | Admitting: Emergency Medicine

## 2020-09-08 DIAGNOSIS — Z87891 Personal history of nicotine dependence: Secondary | ICD-10-CM | POA: Diagnosis not present

## 2020-09-08 DIAGNOSIS — E039 Hypothyroidism, unspecified: Secondary | ICD-10-CM | POA: Diagnosis not present

## 2020-09-08 DIAGNOSIS — I10 Essential (primary) hypertension: Secondary | ICD-10-CM | POA: Insufficient documentation

## 2020-09-08 DIAGNOSIS — Z79899 Other long term (current) drug therapy: Secondary | ICD-10-CM | POA: Insufficient documentation

## 2020-09-08 DIAGNOSIS — R0602 Shortness of breath: Secondary | ICD-10-CM | POA: Diagnosis not present

## 2020-09-08 DIAGNOSIS — R0789 Other chest pain: Secondary | ICD-10-CM | POA: Diagnosis not present

## 2020-09-08 DIAGNOSIS — Z91040 Latex allergy status: Secondary | ICD-10-CM | POA: Insufficient documentation

## 2020-09-08 DIAGNOSIS — R079 Chest pain, unspecified: Secondary | ICD-10-CM | POA: Diagnosis not present

## 2020-09-08 LAB — POC URINE PREG, ED: Preg Test, Ur: NEGATIVE

## 2020-09-08 LAB — BASIC METABOLIC PANEL
Anion gap: 7 (ref 5–15)
BUN: 12 mg/dL (ref 6–20)
CO2: 26 mmol/L (ref 22–32)
Calcium: 9 mg/dL (ref 8.9–10.3)
Chloride: 105 mmol/L (ref 98–111)
Creatinine, Ser: 0.67 mg/dL (ref 0.44–1.00)
GFR, Estimated: >60 mL/min (ref >60)
Glucose, Bld: 106 mg/dL — ABNORMAL HIGH (ref 70–99)
Potassium: 3.6 mmol/L (ref 3.5–5.1)
Sodium: 138 mmol/L (ref 135–145)

## 2020-09-08 LAB — CBC
HCT: 41 % (ref 36.0–46.0)
Hemoglobin: 13 g/dL (ref 12.0–15.0)
MCH: 28 pg (ref 26.0–34.0)
MCHC: 31.7 g/dL (ref 30.0–36.0)
MCV: 88.4 fL (ref 80.0–100.0)
Platelets: 249 10*3/uL (ref 150–400)
RBC: 4.64 MIL/uL (ref 3.87–5.11)
RDW: 13.4 % (ref 11.5–15.5)
WBC: 5.9 10*3/uL (ref 4.0–10.5)
nRBC: 0 % (ref 0.0–0.2)

## 2020-09-08 LAB — TROPONIN I (HIGH SENSITIVITY)
Troponin I (High Sensitivity): <2 ng/L (ref <18)
Troponin I (High Sensitivity): <2 ng/L (ref <18)

## 2020-09-08 MED ORDER — ALUM & MAG HYDROXIDE-SIMETH 200-200-20 MG/5ML PO SUSP
30.0000 mL | Freq: Once | ORAL | Status: AC
  Administered 2020-09-08: 30 mL via ORAL
  Filled 2020-09-08: qty 30

## 2020-09-08 MED ORDER — LIDOCAINE VISCOUS HCL 2 % MT SOLN
15.0000 mL | Freq: Once | OROMUCOSAL | Status: AC
  Administered 2020-09-08: 15 mL via ORAL
  Filled 2020-09-08: qty 15

## 2020-09-08 NOTE — ED Provider Notes (Signed)
[facility] EMERGENCY DEPARTMENT Provider Note   CSN: [id number] Arrival date & time: 09/08/20  1349     History Chief Complaint  Patient presents with   Chest Pain    Gina Coleman is a 44 y.o. female.  HPI Patient is a 44 year old female with a history of hypertension as well as hypothyroidism.  She presents to the emergency department due to chest pain.  She states she woke up to her pain this morning around 7 AM.  She states the pain is intermittent, 7/10, aching, nonradiating, central chest pain.  She states it last for about 20 seconds with each occurrence.  No modifying factors.  She had a few episodes this morning and then states that her symptoms almost completely resolved.  She decided to come to the emergency department for further evaluation and then experienced an additional episode in the waiting room.  She notes a dull/aching pain currently but none of the same severity as earlier today.  She has not taken anything for symptoms.  She denies fevers, chills, shortness of breath, diaphoresis, vomiting, abdominal pain, urinary changes, syncope.  She reports some mild nausea when her pain is at its worst.    Past Medical History:  Diagnosis Date   Hypertension    Hyperthyroidism     Patient Active Problem List   Diagnosis Date Noted   Dysmenorrhea, unspecified 08/22/2020   Peripheral neuropathy 08/22/2020   Chronic back pain 06/14/2020   Abnormal mammogram 06/14/2020   Encounter for Papanicolaou smear of cervix 06/14/2020   Hypertension 11/18/2019   Subclinical hyperthyroidism 09/08/2019   Multinodular goiter 07/12/2019    Past Surgical History:  Procedure Laterality Date   foot sx Right    leg sx Right      OB History   No obstetric history on file.     Family History  Problem Relation Age of Onset   Hypertension Sister    Breast cancer Maternal Grandmother     Social History   Tobacco Use   Smoking status: Former Smoker     Types: Cigarettes    Quit date: 07/06/2019    Years since quitting: 1.1   Smokeless tobacco: Never Used  Substance Use Topics   Alcohol use: Yes    Comment: social    Drug use: Never    Home Medications Prior to Admission medications   Medication Sig Start Date End Date Taking? Authorizing Provider  amLODipine (NORVASC) 5 MG tablet Take 1 tablet (5 mg total) by mouth daily. 02/27/20   Corum, Rex Kras, MD  gabapentin (NEURONTIN) 100 MG capsule Take 100 mg by mouth 2 (two) times daily.    [provider]  methimazole (TAPAZOLE) 10 MG tablet Take 1 tablet (10 mg total) by mouth daily. 07/23/20   Shamleffer, Melanie Crazier, MD  lisinopril (ZESTRIL) 5 MG tablet Take by mouth. 04/14/19 09/21/19  [provider]    Allergies    Latex and Other  Review of Systems   Review of Systems  All other systems reviewed and are negative. Ten systems reviewed and are negative for acute change, except as noted in the HPI.   Physical Exam Updated Vital Signs BP 134/81    Pulse 86    Temp 98.5 F (36.9 C)    Resp 20    Ht 5\' 4"  (1.626 m)    Wt 97.5 kg    SpO2 100%    BMI 36.90 kg/m   Physical Exam Vitals and nursing note reviewed.  Constitutional:  General: She is not in acute distress.    Appearance: Normal appearance. She is well-developed and normal weight. She is not ill-appearing, toxic-appearing or diaphoretic.  HENT:     Head: Normocephalic and atraumatic.     Right Ear: External ear normal.     Left Ear: External ear normal.     Nose: Nose normal.     Mouth/Throat:     Mouth: Mucous membranes are moist.     Pharynx: Oropharynx is clear. No oropharyngeal exudate or posterior oropharyngeal erythema.  Eyes:     Extraocular Movements: Extraocular movements intact.  Cardiovascular:     Rate and Rhythm: Normal rate and regular rhythm.     Pulses: Normal pulses.          Radial pulses are 2+ on the right side and 2+ on the left side.       Dorsalis pedis pulses are 2+  on the right side and 2+ on the left side.     Heart sounds: Normal heart sounds. Heart sounds not distant. No murmur heard.  No systolic murmur is present.  No diastolic murmur is present.  No friction rub. No gallop.      Comments: Heart is regular rate and rhythm.  No tachycardia.  No murmurs, rubs, gallops.  No palpable anterior chest wall pain. Pulmonary:     Effort: Pulmonary effort is normal. No tachypnea, accessory muscle usage or respiratory distress.     Breath sounds: Normal breath sounds. No stridor. No decreased breath sounds, wheezing, rhonchi or rales.     Comments: Lungs are clear to auscultation bilaterally. Chest:     Chest wall: No tenderness.  Abdominal:     General: Abdomen is flat.     Palpations: Abdomen is soft.     Tenderness: There is no abdominal tenderness.     Comments: Abdomen is soft and nontender.  Musculoskeletal:        General: Normal range of motion.     Cervical back: Normal range of motion and neck supple. No tenderness.     Right lower leg: No tenderness. No edema.     Left lower leg: No tenderness. No edema.     Comments: No calf pain or leg swelling.  Skin:    General: Skin is warm and dry.  Neurological:     General: No focal deficit present.     Mental Status: She is alert and oriented to person, place, and time.  Psychiatric:        Mood and Affect: Mood normal.        Behavior: Behavior normal.    ED Results / Procedures / Treatments   Labs (all labs ordered are listed, but only abnormal results are displayed) Labs Reviewed  BASIC METABOLIC PANEL - Abnormal; Notable for the following components:      Result Value   Glucose, Bld 106 (*)    All other components within normal limits  CBC  POC URINE PREG, ED  TROPONIN I (HIGH SENSITIVITY)  TROPONIN I (HIGH SENSITIVITY)   EKG EKG Interpretation  Date/Time:  Sunday September 08 2020 14:06:58 EDT Ventricular Rate:  90 PR Interval:  200 QRS Duration: 72 QT Interval:  352 QTC  Calculation: 430 R Axis:   65 Text Interpretation: Normal sinus rhythm Normal ECG No old tracing to compare Confirmed by Daleen Bo [phone]) on 09/08/2020 3:54:02 PM   Radiology DG Chest 2 View  Result Date: 09/08/2020 CLINICAL DATA:  Chest pain and shortness of  breath. EXAM: CHEST - 2 VIEW COMPARISON:  None. FINDINGS: The heart size and mediastinal contours are within normal limits. Both lungs are clear. The visualized skeletal structures are unremarkable. IMPRESSION: No active cardiopulmonary disease. Electronically Signed   By: Lovey Newcomer M.D.   On: 09/08/2020 15:26   Procedures Procedures (including critical care time)  Medications Ordered in ED Medications  alum & mag hydroxide-simeth (MAALOX/MYLANTA) 200-200-20 MG/5ML suspension 30 mL (has no administration in time range)    And  lidocaine (XYLOCAINE) 2 % viscous mouth solution 15 mL (has no administration in time range)   ED Course  I have reviewed the triage vital signs and the nursing notes.  Pertinent labs & imaging results that were available during my care of the patient were reviewed by me and considered in my medical decision making (see chart for details).    MDM Rules/Calculators/A&P                          Pt is a 44 y.o. female that presents with a history, physical exam, and ED Clinical Course as noted above.   Patient presents today due to intermittent chest pain that occurred this morning upon waking.  Patient reports an additional episode when arriving to the emergency department.  No palpable pain on my exam.  Physical exam was reassuring.  Heart is regular rate and rhythm.  No tachycardia.  No murmurs, rubs, gallops.  Lungs are clear to auscultation bilaterally.  Patient is afebrile.  No abdominal pain or leg swelling.  No calf tenderness.  Patient is mildly hyperglycemic at 106 but otherwise her basic labs are all reassuring.  She has had a negative troponin x2.  Pregnancy test is negative.  Chest  x-ray is benign.  ECG is normal sinus rhythm.  Heart score is a 2.  Patient given a GI cocktail and states that this significantly resolved her symptoms.  We discussed numerous tips for reducing GERD-like symptoms in the future.  We discussed multiple over-the-counter medications for her symptoms.  She is planning on following up with her PCP if her symptoms recur.  She understands she can always return to the ER if she develops any new or worsening symptoms.  Her questions were answered and she was amicable at the time of discharge.  Her vital signs are stable.  She is afebrile, nontachycardic, not hypoxic.  An After Visit Summary was printed and given to the patient.  Patient discharged to home/self care.  Condition at discharge: Stable  Note: Portions of this report may have been transcribed using voice recognition software. Every effort was made to ensure accuracy; however, inadvertent computerized transcription errors may be present.   Final Clinical Impression(s) / ED Diagnoses Final diagnoses:  Chest pain, unspecified type    Rx / DC Orders ED Discharge Orders    None       Rayna Sexton, PA-C 09/08/20 1852    Daleen Bo, MD 09/09/20 1012

## 2020-09-08 NOTE — ED Triage Notes (Signed)
Pt woke up at 0700 this morning with central cp.

## 2020-09-08 NOTE — Discharge Instructions (Signed)
If you find your symptoms recur, you can always try medication like Tums.  This is a fast acting medication.  I would also recommend Pepcid.  This is an over-the-counter medication that you can take for quick resolution of your symptoms.  If you develop any new or worsening symptoms you can always return to the emergency department for reevaluation.  I would recommend following up with your primary care provider if your symptoms recur.  It was a pleasure to meet you.

## 2020-09-12 ENCOUNTER — Encounter: Payer: Self-pay | Admitting: Internal Medicine

## 2020-09-12 ENCOUNTER — Other Ambulatory Visit: Payer: Self-pay

## 2020-09-12 ENCOUNTER — Ambulatory Visit (INDEPENDENT_AMBULATORY_CARE_PROVIDER_SITE_OTHER): Payer: BC Managed Care – PPO | Admitting: Internal Medicine

## 2020-09-12 VITALS — BP 126/82 | HR 88 | Temp 97.3°F | Resp 18 | Ht 64.0 in | Wt 213.1 lb

## 2020-09-12 DIAGNOSIS — E0590 Thyrotoxicosis, unspecified without thyrotoxic crisis or storm: Secondary | ICD-10-CM | POA: Diagnosis not present

## 2020-09-12 DIAGNOSIS — R079 Chest pain, unspecified: Secondary | ICD-10-CM

## 2020-09-12 DIAGNOSIS — I10 Essential (primary) hypertension: Secondary | ICD-10-CM

## 2020-09-12 DIAGNOSIS — N946 Dysmenorrhea, unspecified: Secondary | ICD-10-CM | POA: Diagnosis not present

## 2020-09-12 NOTE — Progress Notes (Signed)
Established Patient Office Visit  Subjective:  Patient ID: Gina Coleman, female    DOB: [birth year]  Age: 44 y.o. MRN: [id number]  CC:  Chief Complaint  Patient presents with  . Follow-up    ER follow up chest pain she went sunday night they told her it was gas/indigestion she has been better since they didnt give her any meds told her to follow up pcp     HPI Gina Coleman is a 44 year old female with PMH of hypertension and subclinical hyperthyroidism presents for evaluation after ER visit. Patient went to ER with substernal chest pain, and was told it was related to gastritis/gas, and was given Maalox and Lidocaine viscous solution. She states that it was an isolated episode of chest pain, denies any dyspnea or palpitations. She is afraid of heart attack as her mother had died from heart attack at the age of 69. Patient states that she would like to be referred to a Cardiologist.  Patient has a visit with Ob./Gyn. for evaluation of fibroid and dysmenorrhea.  Past Medical History:  Diagnosis Date  . Hypertension   . Hyperthyroidism     Past Surgical History:  Procedure Laterality Date  . foot sx Right   . leg sx Right     Family History  Problem Relation Age of Onset  . Hypertension Sister   . Breast cancer Maternal Grandmother     Social History   Socioeconomic History  . Marital status: Married    Spouse name: Not on file  . Number of children: Not on file  . Years of education: Not on file  . Highest education level: Not on file  Occupational History  . Not on file  Tobacco Use  . Smoking status: Former Smoker    Types: Cigarettes    Quit date: 07/06/2019    Years since quitting: 1.1  . Smokeless tobacco: Never Used  Substance and Sexual Activity  . Alcohol use: Yes    Comment: social   . Drug use: Never  . Sexual activity: Not on file  Other Topics Concern  . Not on file  Social History Narrative  . Not on file   Social Determinants of Health    Financial Resource Strain:   . Difficulty of Paying Living Expenses: Not on file  Food Insecurity:   . Worried About Charity fundraiser in the Last Year: Not on file  . Ran Out of Food in the Last Year: Not on file  Transportation Needs:   . Lack of Transportation (Medical): Not on file  . Lack of Transportation (Non-Medical): Not on file  Physical Activity:   . Days of Exercise per Week: Not on file  . Minutes of Exercise per Session: Not on file  Stress:   . Feeling of Stress : Not on file  Social Connections:   . Frequency of Communication with Friends and Family: Not on file  . Frequency of Social Gatherings with Friends and Family: Not on file  . Attends Religious Services: Not on file  . Active Member of Clubs or Organizations: Not on file  . Attends Archivist Meetings: Not on file  . Marital Status: Not on file  Intimate Partner Violence:   . Fear of Current or Ex-Partner: Not on file  . Emotionally Abused: Not on file  . Physically Abused: Not on file  . Sexually Abused: Not on file    Outpatient Medications Prior to Visit  Medication Sig Dispense Refill  .  amLODipine (NORVASC) 5 MG tablet Take 1 tablet (5 mg total) by mouth daily. 90 tablet 1  . gabapentin (NEURONTIN) 100 MG capsule Take 100 mg by mouth 2 (two) times daily.    . methimazole (TAPAZOLE) 10 MG tablet Take 1 tablet (10 mg total) by mouth daily. 90 tablet 1   No facility-administered medications prior to visit.    Allergies  Allergen Reactions  . Latex   . Other     Other reaction(s): Other GLOVE POWDER = RASH GLOVE POWDER = RASH GLOVE POWDER = RASH     ROS Review of Systems  Constitutional: Negative for chills and fever.  HENT: Negative for congestion, postnasal drip, rhinorrhea, sinus pressure, sinus pain and sore throat.   Eyes: Negative for pain and discharge.  Respiratory: Negative for cough and shortness of breath.   Cardiovascular: Negative for chest pain, palpitations  and leg swelling.  Gastrointestinal: Negative for abdominal pain, constipation, diarrhea, nausea and vomiting.  Endocrine: Negative for polydipsia and polyuria.  Genitourinary: Negative for dysuria and hematuria.  Musculoskeletal: Negative for neck pain and neck stiffness.  Skin: Negative for rash.  Neurological: Negative for dizziness, syncope, speech difficulty, weakness, numbness and headaches.  Psychiatric/Behavioral: Negative for agitation and behavioral problems.      Objective:    Physical Exam Vitals reviewed.  Constitutional:      General: She is not in acute distress.    Appearance: She is obese. She is not diaphoretic.  HENT:     Head: Normocephalic and atraumatic.     Nose: Nose normal. No congestion.     Mouth/Throat:     Mouth: Mucous membranes are moist.     Pharynx: No posterior oropharyngeal erythema.  Eyes:     General: No scleral icterus.    Extraocular Movements: Extraocular movements intact.     Pupils: Pupils are equal, round, and reactive to light.  Cardiovascular:     Rate and Rhythm: Normal rate and regular rhythm.     Heart sounds: No murmur heard.  No friction rub. No gallop.   Pulmonary:     Breath sounds: Normal breath sounds. No wheezing or rales.  Abdominal:     Palpations: Abdomen is soft.     Tenderness: There is no abdominal tenderness.  Musculoskeletal:     Cervical back: Normal range of motion and neck supple. No rigidity or tenderness.     Right lower leg: No edema.     Left lower leg: No edema.  Skin:    General: Skin is warm.     Findings: No rash.  Neurological:     General: No focal deficit present.     Mental Status: She is alert and oriented to person, place, and time.     Sensory: No sensory deficit.     Motor: No weakness.  Psychiatric:        Mood and Affect: Mood normal.        Behavior: Behavior normal.     BP 126/82 (BP Location: Right Arm, Patient Position: Sitting, Cuff Size: Normal)   Pulse 88   Temp (!) 97.3  F (36.3 C) (Temporal)   Resp 18   Ht 5\' 4"  (1.626 m)   Wt 213 lb 1.9 oz (96.7 kg)   SpO2 100%   BMI 36.58 kg/m  Wt Readings from Last 3 Encounters:  09/12/20 213 lb 1.9 oz (96.7 kg)  09/08/20 215 lb (97.5 kg)  08/22/20 215 lb (97.5 kg)     Health Maintenance  Due  Topic Date Due  . Hepatitis C Screening  Never done  . HIV Screening  Never done  . TETANUS/TDAP  Never done    There are no preventive care reminders to display for this patient.  Lab Results  Component Value Date   TSH 0.98 07/22/2020   Lab Results  Component Value Date   WBC 5.9 09/08/2020   HGB 13.0 09/08/2020   HCT 41.0 09/08/2020   MCV 88.4 09/08/2020   PLT 249 09/08/2020   Lab Results  Component Value Date   NA 138 09/08/2020   K 3.6 09/08/2020   CO2 26 09/08/2020   GLUCOSE 106 (H) 09/08/2020   BUN 12 09/08/2020   CREATININE 0.67 09/08/2020   BILITOT 0.5 05/13/2020   ALKPHOS 126 (H) 05/13/2020   AST 20 05/13/2020   ALT 28 05/13/2020   PROT 7.4 05/13/2020   ALBUMIN 4.0 05/13/2020   CALCIUM 9.0 09/08/2020   ANIONGAP 7 09/08/2020   GFR 69.56 05/13/2020   Lab Results  Component Value Date   CHOL 198 10/30/2019   Lab Results  Component Value Date   HDL 58 10/30/2019   Lab Results  Component Value Date   LDLCALC 126 (H) 10/30/2019   Lab Results  Component Value Date   TRIG 52 10/30/2019   Lab Results  Component Value Date   CHOLHDL 3.4 10/30/2019   No results found for: HGBA1C    Assessment & Plan:   Problem List Items Addressed This Visit      Cardiovascular and Mediastinum   Hypertension - Primary Well-controlled with Amlodipine 5 mg QD Continue to follow low-salt diet and moderate exercise as tolerated   Relevant Orders   Ambulatory referral to Cardiology  Chest pain Most likely to be related to gastritis Advised to take Pepcid as needed for GERD As patient has h/o family history of cardiac related mortality in early age, will refer to Cardiology for possible  Echo and/or stress testing to r/o unstable angina      Endocrine   Subclinical hyperthyroidism On Methimazole 10 mg QD Follows up with Endocrinologist     Genitourinary   Dysmenorrhea, unspecified US abdomen showed fibroid. Has an appointment with Ob./Gyn. tomorrow    Other Visit Diagnoses    Chest pain, unspecified type       Relevant Orders   Ambulatory referral to Cardiology      No orders of the defined types were placed in this encounter.   Follow-up: Return if symptoms worsen or fail to improve.    Lindell Spar, MD

## 2020-09-12 NOTE — Patient Instructions (Signed)
Please continue to take Amlodipine for hypertension.  Please take Pepcid over-the-counter for heartburn.  If you have chest pressure-like sensation with shortness of breath or if the chest pain is associated with palpitations or left shoulder pain, please go to ER immediately.  You are being referred to Cardiology for possible Echocardiographic evaluation.  Please follow up with Ob./Gyn. as scheduled.  Please follow DASH diet for hypertension. DASH stands for Dietary Approaches to Stop Hypertension. The DASH diet is a healthy-eating plan designed to help treat or prevent high blood pressure (hypertension).  The DASH diet includes foods that are rich in potassium, calcium and magnesium. These nutrients help control blood pressure. The diet limits foods that are high in sodium, saturated fat and added sugars.  Studies have shown that the DASH diet can lower blood pressure in as little as two weeks. The diet can also lower low-density lipoprotein (LDL or "bad") cholesterol levels in the blood. High blood pressure and high LDL cholesterol levels are two major risk factors for heart disease and stroke.    DASH diet: Recommended servings The DASH diet provides daily and weekly nutritional goals. The number of servings you should have depends on your daily calorie needs.  Here's a look at the recommended servings from each food group for a 2,000-calorie-a-day DASH diet:  Grains: 6 to 8 servings a day. One serving is one slice bread, 1 ounce dry cereal, or 1/2 cup cooked cereal, rice or pasta. Vegetables: 4 to 5 servings a day. One serving is 1 cup raw leafy green vegetable, 1/2 cup cut-up raw or cooked vegetables, or 1/2 cup vegetable juice. Fruits: 4 to 5 servings a day. One serving is one medium fruit, 1/2 cup fresh, frozen or canned fruit, or 1/2 cup fruit juice. Fat-free or low-fat dairy products: 2 to 3 servings a day. One serving is 1 cup milk or yogurt, or 1 1/2 ounces cheese. Lean meats,  poultry and fish: six 1-ounce servings or fewer a day. One serving is 1 ounce cooked meat, poultry or fish, or 1 egg. Nuts, seeds and legumes: 4 to 5 servings a week. One serving is 1/3 cup nuts, 2 tablespoons peanut butter, 2 tablespoons seeds, or 1/2 cup cooked legumes (dried beans or peas). Fats and oils: 2 to 3 servings a day. One serving is 1 teaspoon soft margarine, 1 teaspoon vegetable oil, 1 tablespoon mayonnaise or 2 tablespoons salad dressing. Sweets and added sugars: 5 servings or fewer a week. One serving is 1 tablespoon sugar, jelly or jam, 1/2 cup sorbet, or 1 cup lemonade.

## 2020-09-13 ENCOUNTER — Ambulatory Visit: Payer: BC Managed Care – PPO | Admitting: Adult Health

## 2020-09-13 ENCOUNTER — Encounter: Payer: Self-pay | Admitting: Adult Health

## 2020-09-13 VITALS — BP 147/92 | HR 86 | Ht 64.0 in | Wt 216.5 lb

## 2020-09-13 DIAGNOSIS — D252 Subserosal leiomyoma of uterus: Secondary | ICD-10-CM | POA: Insufficient documentation

## 2020-09-13 DIAGNOSIS — N920 Excessive and frequent menstruation with regular cycle: Secondary | ICD-10-CM | POA: Diagnosis not present

## 2020-09-13 DIAGNOSIS — N946 Dysmenorrhea, unspecified: Secondary | ICD-10-CM

## 2020-09-13 MED ORDER — NORETHINDRONE ACETATE 5 MG PO TABS: 5.0000 mg | ORAL_TABLET | Freq: Every day | ORAL | 2 refills | Status: DC

## 2020-09-13 NOTE — Patient Instructions (Signed)

## 2020-09-13 NOTE — Progress Notes (Signed)
  Subjective:     Patient ID: Gina Coleman, female   DOB: [birth year], 44 y.o.   MRN: [id number]  HPI Bert is a 44 year old black female,married, [phone] in complaining of heavy painful periods. She had [place] that showed 1.5 x 1.5 x 2.1 ck subserosal fibroid. She has noticed moodiness and some sleep disturbance.  PCP is Dr Posey Pronto.   Review of Systems Heavy painful periods,changes pads 4-5 x daily when heavy  +moodiness  Some sleep disturbance  Denies any pain with sex   Reviewed past medical,surgical, social and family history. Reviewed medications and allergies.     Objective:   Physical Exam BP (!) 147/92 (BP Location: Left Arm, Patient Position: Sitting, Cuff Size: Large)   Pulse 86   Ht 5\' 4"  (1.626 m)   Wt 216 lb 8 oz (98.2 kg)   LMP 08/29/2020 (Approximate)   BMI 37.16 kg/m  Skin warm and dry.Pelvic: external genitalia is normal in appearance no lesions, vagina: pink with ,urethra has no lesions or masses noted, cervix is  Bulbous, has multiple nabothian cysts 4-6 0'clock, uterus: normal size, shape and contour, non tender, no masses felt, adnexa: no masses or tenderness noted. Bladder is non tender and no masses felt.  AA is 3  Fall risk is low  PHQ 9 score is 1  Upstream - 09/13/20 1227      Pregnancy Intention Screening   Does the patient want to become pregnant in the next year? N/A    Does the patient's partner want to become pregnant in the next year? N/A    Would the patient like to discuss contraceptive options today? N/A      Contraception Wrap Up   Current Method Female Sterilization    End Method Female Sterilization    Contraception Counseling Provided No            Examination chaperoned by Levy Pupa LPN   Assessment:     1. Menorrhagia with regular cycle Discussed trying a progesterone med to stop her period or endometrial ablation Will try aygestin Meds ordered this encounter  Medications  . norethindrone (AYGESTIN) 5 MG tablet    Sig:  Take 1 tablet (5 mg total) by mouth daily.    Dispense:  30 tablet    Refill:  2    Order Specific Question:   Supervising Provider    Answer:   Florian Buff [[id number]]  Review handout on endometrial ablation   2. Dysmenorrhea Will try aygestin   3. Fibroids, subserous   -review handout on fibroids Plan:     Follow up in 6 weeks with me

## 2020-09-26 ENCOUNTER — Other Ambulatory Visit: Payer: Self-pay

## 2020-09-26 ENCOUNTER — Ambulatory Visit (INDEPENDENT_AMBULATORY_CARE_PROVIDER_SITE_OTHER): Payer: BC Managed Care – PPO | Admitting: Advanced Practice Midwife

## 2020-09-26 VITALS — BP 134/95 | HR 81 | Ht 64.0 in | Wt 213.0 lb

## 2020-09-26 DIAGNOSIS — N611 Abscess of the breast and nipple: Secondary | ICD-10-CM | POA: Diagnosis not present

## 2020-09-26 MED ORDER — SULFAMETHOXAZOLE-TRIMETHOPRIM 800-160 MG PO TABS: 1.0000 | ORAL_TABLET | Freq: Two times a day (BID) | ORAL | 0 refills | Status: DC

## 2020-09-26 NOTE — Progress Notes (Signed)
[facility] Visit  Patient name: Gina Coleman MRN [id number]  Date of birth: Mar 09, 1976  CC & HPI:  Gina Coleman is a 44 y.o. African American female presenting today for left breast tenderness.  Started last Sunday out of nowhere, now sore all the time w/sharp pains. Heating pad helps..  Had mammogram 3 months ago and an [place] after ? Mass.  Results :  IMPRESSION: 1. There are 3 likely benign masses in the left breast, one at 11:30, one at 1 o'clock, 4 cm from the nipple and another 1 o'clock in the retroareolar breast.   Pertinent History Reviewed:  Medical & Surgical Hx:   Past Medical History:  Diagnosis Date  . Hypertension   . Hyperthyroidism    Past Surgical History:  Procedure Laterality Date  . foot sx Right   . leg sx Right   . TUBAL LIGATION     Family History  Problem Relation Age of Onset  . Hypertension Sister   . Breast cancer Maternal Grandmother   . HIV Sister   . Asthma Son     Current Outpatient Medications:  .  amLODipine (NORVASC) 5 MG tablet, Take 1 tablet (5 mg total) by mouth daily., Disp: 90 tablet, Rfl: 1 .  gabapentin (NEURONTIN) 100 MG capsule, Take 100 mg by mouth 2 (two) times daily., Disp: , Rfl:  .  methimazole (TAPAZOLE) 10 MG tablet, Take 1 tablet (10 mg total) by mouth daily., Disp: 90 tablet, Rfl: 1 .  norethindrone (AYGESTIN) 5 MG tablet, Take 1 tablet (5 mg total) by mouth daily., Disp: 30 tablet, Rfl: 2 Social History: Reviewed -  reports that she quit smoking about 14 months ago. Her smoking use included cigarettes. She has never used smokeless tobacco.  Review of Systems:   Constitutional: Negative for fever and chills Eyes: Negative for visual disturbances Respiratory: Negative for shortness of breath, dyspnea Cardiovascular: Negative for chest pain or palpitations  Gastrointestinal: Negative for vomiting, diarrhea and constipation; no abdominal pain Genitourinary: Negative for dysuria and urgency, vaginal  irritation or itching Musculoskeletal: Negative for back pain, joint pain, myalgias  Neurological: Negative for dizziness and headaches    Objective Findings:    Physical Examination: There were no vitals filed for this visit. General appearance - well appearing, and in no distress Mental status - alert, oriented to person, place, and time Chest:  Normal respiratory effort Breast:  Left breast, 1 o'clock, palpable soft, mobile ~ 1cm tender area.  No erythema or visible skin irregularities Heart - normal rate and regular rhythm Abdomen:  Soft, nontender Pelvic: deferredc Musculoskeletal:  Normal range of motion without pain Extremities:  No edema    No results found for this or any previous visit (from the past 24 hour(s)).    Assessment & Plan:  A:   ? abscess P:  Discussed w/L[facility].  As pt just had imaging studies 3 months ago, give 10days of bactrim and start there. F/U after abx are complete   No follow-ups on file.  Christin Fudge CNM 09/26/2020 3:37 PM

## 2020-10-17 ENCOUNTER — Other Ambulatory Visit: Payer: Self-pay

## 2020-10-17 ENCOUNTER — Ambulatory Visit: Payer: BC Managed Care – PPO | Admitting: General Surgery

## 2020-10-17 ENCOUNTER — Ambulatory Visit: Payer: BC Managed Care – PPO | Admitting: Internal Medicine

## 2020-10-17 ENCOUNTER — Encounter: Payer: Self-pay | Admitting: General Surgery

## 2020-10-17 VITALS — BP 121/84 | HR 86 | Temp 98.7°F | Resp 16 | Ht 64.0 in | Wt 215.0 lb

## 2020-10-17 DIAGNOSIS — N644 Mastodynia: Secondary | ICD-10-CM | POA: Diagnosis not present

## 2020-10-17 NOTE — Progress Notes (Signed)
[facility] History and Physical  Reason for Referral: Left breast pain  Referring Physician:  Lindell Spar, MD   Chief Complaint    New Patient (Initial Visit)      Gina Coleman is a 44 y.o. female.  HPI: Gina Coleman is a 44 yo who had an abnormal screening mammogram in July on the left side and a follow up diagnostic that demonstrated three small areas felt to be benign. She has since that time developed pain in the left breast that was not there prior.    The patient has no history of any masses, lumps, bumps, nipple changes or discharge. She has always had a slightly more retracted nipple on the left for years and has had issues with breast feeding on that side. She has a history of any family breast cancer in her maternal grandmother at unknown age.  She has not had any prior biopsies.     She was treated for presumed abscess of the breast by her PCP but had no findings of erythema, swelling or drainage or documentation of abscess on imaging. The antibiotics did not help the pain. She describes a burning pain in the left lateral breast that is constant. She has stopped drinking caffeine but has not really noticed a change.   Past Medical History:  Diagnosis Date  . Hypertension   . Hyperthyroidism     Past Surgical History:  Procedure Laterality Date  . foot sx Right   . leg sx Right   . TUBAL LIGATION      Family History  Problem Relation Age of Onset  . Hypertension Sister   . Breast cancer Maternal Grandmother   . HIV Sister   . Asthma Son     Social History   Tobacco Use  . Smoking status: Former Smoker    Types: Cigarettes    Quit date: 07/06/2019    Years since quitting: 1.2  . Smokeless tobacco: Never Used  Vaping Use  . Vaping Use: Never used  Substance Use Topics  . Alcohol use: Yes    Comment: social   . Drug use: Never    Medications: I have reviewed the patient's current medications. Allergies as of 10/17/2020       Reactions   Latex    Other    Other reaction(s): Other GLOVE POWDER = RASH GLOVE POWDER = RASH GLOVE POWDER = RASH      Medication List       Accurate as of October 17, 2020 11:59 PM. If you have any questions, ask your nurse or doctor.        STOP taking these medications   gabapentin 100 MG capsule Commonly known as: NEURONTIN Stopped by: Gina Cagey, MD   methimazole 10 MG tablet Commonly known as: TAPAZOLE Stopped by: Gina Cagey, MD   sulfamethoxazole-trimethoprim 800-160 MG tablet Commonly known as: BACTRIM DS Stopped by: Gina Cagey, MD     TAKE these medications   amLODipine 5 MG tablet Commonly known as: NORVASC Take 1 tablet (5 mg total) by mouth daily.   norethindrone 5 MG tablet Commonly known as: Aygestin Take 1 tablet (5 mg total) by mouth daily.        ROS:  A comprehensive review of systems was negative except for: Integument/breast: positive for eczema, left lateral breast pain Neurological: positive for headaches  Blood pressure 121/84, pulse 86, temperature 98.7 F (37.1 C), temperature source Oral, resp. rate 16, height 5\' 4"  (  1.626 m), weight 215 lb (97.5 kg), SpO2 97 %. Physical Exam Vitals reviewed.  Constitutional:      Appearance: Normal appearance.  HENT:     Head: Normocephalic.     Nose: Nose normal.     Mouth/Throat:     Mouth: Mucous membranes are moist.  Eyes:     Extraocular Movements: Extraocular movements intact.  Cardiovascular:     Rate and Rhythm: Normal rate and regular rhythm.  Pulmonary:     Breath sounds: Normal breath sounds.  Chest:     Breasts:        Right: No inverted nipple, mass, nipple discharge, skin change or tenderness.        Left: Tenderness present. No inverted nipple, mass, nipple discharge or skin change.     Comments: Left side slightly retracted nipple but not completely inverted, fibrocystic breast bilaterally  Abdominal:     General: There is no distension.      Palpations: Abdomen is soft.     Tenderness: There is no abdominal tenderness.  Musculoskeletal:        General: No swelling. Normal range of motion.     Cervical back: Normal range of motion.  Lymphadenopathy:     Upper Body:     Right upper body: No supraclavicular or axillary adenopathy.     Left upper body: No supraclavicular or axillary adenopathy.  Skin:    General: Skin is warm and dry.  Neurological:     General: No focal deficit present.     Mental Status: She is alert and oriented to person, place, and time.  Psychiatric:        Mood and Affect: Mood normal.        Behavior: Behavior normal.        Thought Content: Thought content normal.        Judgment: Judgment normal.     Results: CLINICAL DATA:  Screening recall for a possible left breast mass.  EXAM: DIGITAL DIAGNOSTIC UNILATERAL LEFT MAMMOGRAM WITH TOMO AND CAD; ULTRASOUND LEFT BREAST LIMITED  COMPARISON:  Previous exam(s).  ACR Breast Density Category c: The breast tissue is heterogeneously dense, which may obscure small masses.  FINDINGS: Spot compression tomosynthesis images and full paddle true lateral tomosynthesis images through the upper inner posterior left breast demonstrates no definite persistent masses. However, the patient's breast tissue is very dense in this region, and therefore ultrasound will be performed for further evaluation.  Mammographic images were processed with CAD.  Ultrasound targeted to the left breast at 11:30, 4 cm from the nipple demonstrates a near anechoic bilobed mass measuring 0.7 x 0.4 x 0.5 cm. This likely corresponds with the mass seen on the mammogram.  Ultrasound targeted to the left breast at 1 o'clock, 4 cm from the nipple demonstrates an oval hypoechoic mass with septations measuring is 1.0 x 0.4 x 0.6 cm. This may represent a fibroadenoma or a cluster of complicated cysts.  Ultrasound of the left breast at 1 o'clock, 2 cm from the  nipple demonstrates a few scattered benign-appearing cysts.  Ultrasound of the left breast at 1 o'clock in the retroareolar breast demonstrates a circumscribed oval hypoechoic mass measuring 0.8 x 0.4 x 0.6 cm, favored to represent a fibroadenoma.  IMPRESSION: 1. There are 3 likely benign masses in the left breast, one at 11:30, one at 1 o'clock, 4 cm from the nipple and another 1 o'clock in the retroareolar breast.  RECOMMENDATION: Six-month follow-up left breast mammogram and ultrasound.  I have discussed the findings and recommendations with the patient. If applicable, a reminder letter will be sent to the patient regarding the next appointment.  BI-RADS CATEGORY  3: Probably benign.   Electronically Signed   By: Ammie Ferrier M.D.   On: 06/17/2020 16:09  Assessment & Plan:  Gina Coleman is a 44 y.o. female with new onset of left breast pain after having abnormal imaging in July. She has three areas that were likely benign. She has had no biopsy and had recommendation for follow up in 6 months. I have reached out to breast radiology to discuss the options of repeat mammogram now with the new symptoms versus MRI given density of her breast and questions from prior mammogram.   -Will discuss with radiology and make a determination and order appropriate testing  -Will call patient after results as there is no surgical indication at this time and needs further work up  -PRN follow up pending the repeat imaging   All questions were answered to the satisfaction of the patient.   Gina Coleman 10/21/2020, 10:44 AM

## 2020-10-17 NOTE — Patient Instructions (Signed)
Will talk to the breast radiologist and decide what imaging to repeat.  Office will call to get this scheduled and I will call you with the results so we can talk from there and make decisions.   What are the treatments for breast pain? Use less salt. Wear a supportive bra. Apply local heat to the painful area. Take over-the-counter pain relievers sparingly, as needed. Avoid caffeine. Well-designed studies have not shown that avoiding caffeine can treat breast pain. However, many women report significant improvement in their symptoms when they reduce their intake of tea, coffee, chocolate, and energy drinks. Try Vitamin E. Studies have not consistently shown benefits of vitamin E for treating breast pain, though some women find it helpful. Using vitamin E for a few weeks to see if it will help is unlikely to cause any harm. However, long-term use of vitamin E supplementation is not recommended for breast pain, as there are some studies suggesting this may not be safe. Try evening primrose oil. Similar to vitamin E, studies have not consistently shown evening primrose oil to be helpful in treating breast pain, though it does help some women. Evening primrose oil is found over-the-counter. Side effects might include nausea, diarrhea, and headaches. In the past, there was concern that certain patients might be at increased risk of seizures when taking this supplement, though this is now disputed. Try Omega-3 fatty acid. Though not proven to be effective in rigorous studies, some women find increased intake of fish oils/omega-3 supplements to be helpful. Natural dietary sources include: dark green leafy vegetables, ocean-raised ("wild") cold-water fish, flax, walnuts, and sesame. Omega-3 supplements are also available by prescription and over-the-counter. Give it time. Most commonly, pain goes away on its own after a few months, without the need for any treatment.

## 2020-10-22 ENCOUNTER — Telehealth (INDEPENDENT_AMBULATORY_CARE_PROVIDER_SITE_OTHER): Payer: BC Managed Care – PPO | Admitting: General Surgery

## 2020-10-22 DIAGNOSIS — N644 Mastodynia: Secondary | ICD-10-CM

## 2020-10-22 DIAGNOSIS — R928 Other abnormal and inconclusive findings on diagnostic imaging of breast: Secondary | ICD-10-CM

## 2020-10-22 NOTE — Telephone Encounter (Signed)
[facility]  Discussed options for imaging with breast radiologist Dr. Shelly Bombard. Will plan for repeat diagnostic mammogram now and if still concerned may need to do MRI.  Ordered mammogram of left breast.  Will get office to schedule.   Curlene Labrum, MD [facility] [address] [place], [place] [address] [phone] (office)

## 2020-10-23 ENCOUNTER — Ambulatory Visit: Payer: BC Managed Care – PPO | Admitting: Adult Health

## 2020-10-31 ENCOUNTER — Other Ambulatory Visit: Payer: Self-pay

## 2020-10-31 ENCOUNTER — Emergency Department (HOSPITAL_COMMUNITY): Payer: BC Managed Care – PPO

## 2020-10-31 ENCOUNTER — Encounter (HOSPITAL_COMMUNITY): Payer: Self-pay

## 2020-10-31 ENCOUNTER — Emergency Department (HOSPITAL_COMMUNITY)
Admission: EM | Admit: 2020-10-31 | Discharge: 2020-10-31 | Disposition: A | Discharge status: Home / Self Care | Payer: BC Managed Care – PPO | Attending: Emergency Medicine | Admitting: Emergency Medicine

## 2020-10-31 DIAGNOSIS — I10 Essential (primary) hypertension: Secondary | ICD-10-CM | POA: Insufficient documentation

## 2020-10-31 DIAGNOSIS — Z79899 Other long term (current) drug therapy: Secondary | ICD-10-CM | POA: Diagnosis not present

## 2020-10-31 DIAGNOSIS — R079 Chest pain, unspecified: Secondary | ICD-10-CM | POA: Diagnosis not present

## 2020-10-31 DIAGNOSIS — R0789 Other chest pain: Secondary | ICD-10-CM | POA: Insufficient documentation

## 2020-10-31 DIAGNOSIS — Z87891 Personal history of nicotine dependence: Secondary | ICD-10-CM | POA: Insufficient documentation

## 2020-10-31 LAB — COMPREHENSIVE METABOLIC PANEL
ALT: 15 U/L (ref 0–44)
AST: 14 U/L — ABNORMAL LOW (ref 15–41)
Albumin: 3.8 g/dL (ref 3.5–5.0)
Alkaline Phosphatase: 112 U/L (ref 38–126)
Anion gap: 7 (ref 5–15)
BUN: 10 mg/dL (ref 6–20)
CO2: 26 mmol/L (ref 22–32)
Calcium: 8.9 mg/dL (ref 8.9–10.3)
Chloride: 101 mmol/L (ref 98–111)
Creatinine, Ser: 0.71 mg/dL (ref 0.44–1.00)
GFR, Estimated: >60 mL/min (ref >60)
Glucose, Bld: 121 mg/dL — ABNORMAL HIGH (ref 70–99)
Potassium: 3.6 mmol/L (ref 3.5–5.1)
Sodium: 134 mmol/L — ABNORMAL LOW (ref 135–145)
Total Bilirubin: 0.6 mg/dL (ref 0.3–1.2)
Total Protein: 7.8 g/dL (ref 6.5–8.1)

## 2020-10-31 LAB — CBC
HCT: 41.8 % (ref 36.0–46.0)
Hemoglobin: 13.1 g/dL (ref 12.0–15.0)
MCH: 28.2 pg (ref 26.0–34.0)
MCHC: 31.3 g/dL (ref 30.0–36.0)
MCV: 90.1 fL (ref 80.0–100.0)
Platelets: 271 10*3/uL (ref 150–400)
RBC: 4.64 MIL/uL (ref 3.87–5.11)
RDW: 13.6 % (ref 11.5–15.5)
WBC: 4.9 10*3/uL (ref 4.0–10.5)
nRBC: 0 % (ref 0.0–0.2)

## 2020-10-31 LAB — URINALYSIS, ROUTINE W REFLEX MICROSCOPIC
Bilirubin Urine: NEGATIVE
Glucose, UA: NEGATIVE mg/dL
Hgb urine dipstick: NEGATIVE
Ketones, ur: NEGATIVE mg/dL
Leukocytes,Ua: NEGATIVE
Nitrite: NEGATIVE
Protein, ur: NEGATIVE mg/dL
Specific Gravity, Urine: 1.018 (ref 1.005–1.030)
pH: 6 (ref 5.0–8.0)

## 2020-10-31 LAB — TROPONIN I (HIGH SENSITIVITY)
Troponin I (High Sensitivity): <2 ng/L (ref <18)
Troponin I (High Sensitivity): <2 ng/L (ref <18)

## 2020-10-31 LAB — TSH: TSH: 1.397 u[IU]/mL (ref 0.350–4.500)

## 2020-10-31 LAB — LIPASE, BLOOD: Lipase: 29 U/L (ref 11–51)

## 2020-10-31 LAB — PREGNANCY, URINE: Preg Test, Ur: NEGATIVE

## 2020-10-31 MED ORDER — FAMOTIDINE 20 MG PO TABS: 20.0000 mg | ORAL_TABLET | Freq: Every day | ORAL | 0 refills | Status: DC

## 2020-10-31 MED ORDER — ALUM & MAG HYDROXIDE-SIMETH 200-200-20 MG/5ML PO SUSP
30.0000 mL | Freq: Once | ORAL | Status: AC
  Administered 2020-10-31: 30 mL via ORAL
  Filled 2020-10-31: qty 30

## 2020-10-31 MED ORDER — LIDOCAINE VISCOUS HCL 2 % MT SOLN
15.0000 mL | Freq: Once | OROMUCOSAL | Status: AC
  Administered 2020-10-31: 15 mL via ORAL
  Filled 2020-10-31: qty 15

## 2020-10-31 NOTE — ED Provider Notes (Signed)
[facility] EMERGENCY DEPARTMENT Provider Note   CSN: [id number] Arrival date & time: 10/31/20  0830     History Chief Complaint  Patient presents with  . Chest Pain    Gina Coleman is a 44 y.o. female.  Pt presents to the ED today with CP.  She said it started yesterday around 71.  She has been having intermittent cp and has been here before for the same.  She was last here on 10/10 for cp.  She was given a GI cocktail which did help.  She has had cp again since then and her pcp has referred her to cards mainly because her mom died at age 35 from a MI.  She has an appt next week.  Pt said the pain is sharp in nature and is intermittent.  No sob.  Some nausea. CP is gone now.  She has not been taking any meds for GERD.        Past Medical History:  Diagnosis Date  . Hypertension   . Hyperthyroidism     Patient Active Problem List   Diagnosis Date Noted  . Breast pain, left 10/17/2020  . Fibroids, subserous 09/13/2020  . Dysmenorrhea 09/13/2020  . Menorrhagia with regular cycle 09/13/2020  . Dysmenorrhea, unspecified 08/22/2020  . Peripheral neuropathy 08/22/2020  . Chronic back pain 06/14/2020  . Abnormal mammogram 06/14/2020  . Encounter for Papanicolaou smear of cervix 06/14/2020  . Hypertension 11/18/2019  . Subclinical hyperthyroidism 09/08/2019  . Multinodular goiter 07/12/2019  . Muscle pain 02/23/2018  . Pain of right leg 01/21/2012    Past Surgical History:  Procedure Laterality Date  . foot sx Right   . leg sx Right   . TUBAL LIGATION       OB History    Gravida  3   Para  3   Term  1   Preterm  2   AB      Living  3     SAB      TAB      Ectopic      Multiple      Live Births  3           Family History  Problem Relation Age of Onset  . Hypertension Sister   . Breast cancer Maternal Grandmother   . HIV Sister   . Asthma Son     Social History   Tobacco Use  . Smoking status: Former Smoker    Types:  Cigarettes    Quit date: 07/06/2019    Years since quitting: 1.3  . Smokeless tobacco: Never Used  Vaping Use  . Vaping Use: Never used  Substance Use Topics  . Alcohol use: Yes    Comment: social   . Drug use: Never    Home Medications Prior to Admission medications   Medication Sig Start Date End Date Taking? Authorizing Provider  amLODipine (NORVASC) 5 MG tablet Take 1 tablet (5 mg total) by mouth daily. 02/27/20  Yes Corum, Rex Kras, MD  norethindrone (AYGESTIN) 5 MG tablet Take 1 tablet (5 mg total) by mouth daily. 09/13/20  Yes Estill Dooms, NP  Omega-3 Fatty Acids (FISH OIL PO) Take by mouth.   Yes [provider]  famotidine (PEPCID) 20 MG tablet Take 1 tablet (20 mg total) by mouth daily. 10/31/20   Isla Pence, MD  lisinopril (ZESTRIL) 5 MG tablet Take by mouth. 04/14/19 09/21/19  [provider]    Allergies  Latex and Other  Review of Systems   Review of Systems  Cardiovascular: Positive for chest pain.  All other systems reviewed and are negative.   Physical Exam Updated Vital Signs BP 123/78   Pulse 66   Temp 98.3 F (36.8 C) (Oral)   Resp 17   Ht 5\' 3"  (1.6 m)   Wt 96.6 kg   LMP 10/27/2020   SpO2 99%   BMI 37.73 kg/m   Physical Exam Vitals and nursing note reviewed.  Constitutional:      Appearance: She is well-developed. She is obese.  HENT:     Head: Normocephalic and atraumatic.  Eyes:     Extraocular Movements: Extraocular movements intact.     Pupils: Pupils are equal, round, and reactive to light.  Cardiovascular:     Rate and Rhythm: Normal rate and regular rhythm.     Heart sounds: Normal heart sounds.  Pulmonary:     Effort: Pulmonary effort is normal.     Breath sounds: Normal breath sounds.  Abdominal:     General: Bowel sounds are normal.     Palpations: Abdomen is soft.  Musculoskeletal:        General: Normal range of motion.     Cervical back: Normal range of motion and neck supple.  Skin:     General: Skin is warm.     Capillary Refill: Capillary refill takes less than 2 seconds.  Neurological:     General: No focal deficit present.     Mental Status: She is alert and oriented to person, place, and time.  Psychiatric:        Mood and Affect: Mood normal.        Behavior: Behavior normal.     ED Results / Procedures / Treatments   Labs (all labs ordered are listed, but only abnormal results are displayed) Labs Reviewed  COMPREHENSIVE METABOLIC PANEL - Abnormal; Notable for the following components:      Result Value   Sodium 134 (*)    Glucose, Bld 121 (*)    AST 14 (*)    All other components within normal limits  URINALYSIS, ROUTINE W REFLEX MICROSCOPIC - Abnormal; Notable for the following components:   APPearance HAZY (*)    All other components within normal limits  CBC  LIPASE, BLOOD  PREGNANCY, URINE  TSH  TROPONIN I (HIGH SENSITIVITY)  TROPONIN I (HIGH SENSITIVITY)    EKG EKG Interpretation  Date/Time:  Thursday October 31 2020 08:43:17 EST Ventricular Rate:  86 PR Interval:    QRS Duration: 78 QT Interval:  356 QTC Calculation: 426 R Axis:   44 Text Interpretation: Sinus rhythm Borderline prolonged PR interval No significant change since last tracing Confirmed by Isla Pence [phone]) on 10/31/2020 8:49:39 AM   Radiology DG Chest 2 View  Result Date: 10/31/2020 CLINICAL DATA:  Intermittent mid chest pain beginning yesterday. EXAM: CHEST - 2 VIEW COMPARISON:  09/08/2020 FINDINGS: The heart size and mediastinal contours are within normal limits. Both lungs are clear. No pleural effusion or pneumothorax. The visualized skeletal structures are unremarkable. IMPRESSION: No active cardiopulmonary disease. Electronically Signed   By: Lajean Manes M.D.   On: 10/31/2020 09:34    Procedures Procedures (including critical care time)  Medications Ordered in ED Medications  alum & mag hydroxide-simeth (MAALOX/MYLANTA) 200-200-20 MG/5ML suspension 30  mL (30 mLs Oral Given 10/31/20 0932)    And  lidocaine (XYLOCAINE) 2 % viscous mouth solution 15 mL (15 mLs Oral Given  10/31/20 0932)    ED Course  I have reviewed the triage vital signs and the nursing notes.  Pertinent labs & imaging results that were available during my care of the patient were reviewed by me and considered in my medical decision making (see chart for details).    MDM Rules/Calculators/A&P                          The GI cocktail did help her pain.  Heart score of 2 with negative troponins.  Pt can f/u as scheduled as an outpatient.  Pt is stable for d/c with pepcid. Return if worse. Final Clinical Impression(s) / ED Diagnoses Final diagnoses:  Atypical chest pain    Rx / DC Orders ED Discharge Orders         Ordered    famotidine (PEPCID) 20 MG tablet  Daily        10/31/20 1140           Isla Pence, MD 10/31/20 1142

## 2020-10-31 NOTE — ED Triage Notes (Signed)
Pt presents to ED with complaints of left sided chest pain started yesterday about 1200. Pt states she was at work when it started. Pt states she had the same pain a couple of months ago and came to ED, received a GI cocktail which helped.

## 2020-11-04 NOTE — Telephone Encounter (Signed)
Pt already schedule for mammogram from Mckinley Jewel, MD for 12/19/2020.

## 2020-11-07 ENCOUNTER — Encounter: Payer: Self-pay | Admitting: Cardiology

## 2020-11-07 NOTE — Progress Notes (Signed)
Cardiology Office Note  Date: 11/08/2020   ID: Gina Coleman, DOB 1976-02-07, MRN [id number]  PCP:  Lindell Spar, MD  Cardiologist:  Rozann Lesches, MD Electrophysiologist:  None   Chief Complaint  Patient presents with  . Chest Pain    History of Present Illness: Gina Coleman is a 44 y.o. female referred for cardiology consultation by Dr. Posey Pronto for the evaluation of chest pain.  She states that she has been experiencing recurring episodes of chest discomfort.  Describes these as a burning sensation sometimes in her upper chest, other times as a tightness.  She cannot identify a specific trigger, not necessarily with activity.  No associated palpitations or breathlessness.  She has had no syncope.  She states that her mother died with a heart attack at age 61, and that her father also passed away in his 8s with a dilated heart.  She has a personal history of hypertension for which she takes Norvasc.  Records indicate ER visit with chest pain on December 2.  Chest x-ray and ECG did not show any acute findings.  High-sensitivity troponin I levels were normal.  She was prescribed Pepcid.  She has only taken this for about 4 days.  She has not undergone any prior ischemic testing.  She is a Quarry manager, works in home care.  Past Medical History:  Diagnosis Date  . Hypertension   . Hyperthyroidism     Past Surgical History:  Procedure Laterality Date  . FOOT SURGERY Right   . LEG SURGERY Right   . TUBAL LIGATION      Current Outpatient Medications  Medication Sig Dispense Refill  . amLODipine (NORVASC) 5 MG tablet Take 1 tablet (5 mg total) by mouth daily. 90 tablet 1  . famotidine (PEPCID) 20 MG tablet Take 1 tablet (20 mg total) by mouth daily. 30 tablet 0  . methimazole (TAPAZOLE) 5 MG tablet Take 5 mg by mouth daily.    . Omega-3 Fatty Acids (FISH OIL PO) Take by mouth.     No current facility-administered medications for this visit.   Allergies:  Latex and  Other   Social History: The patient  reports that she quit smoking about 16 months ago. Her smoking use included cigarettes. She has never used smokeless tobacco. She reports current alcohol use. She reports that she does not use drugs.   Family History: The patient's family history includes Asthma in her son; Breast cancer in her maternal grandmother; CAD in her mother; HIV in her sister; Hypertension in her sister.   ROS: No palpitations or syncope.  Physical Exam: VS:  BP 136/82   Pulse 76   Resp 16   Ht 5\' 4"  (1.626 m)   Wt 213 lb 9.6 oz (96.9 kg)   LMP 10/27/2020   SpO2 98%   BMI 36.66 kg/m , BMI Body mass index is 36.66 kg/m.  Wt Readings from Last 3 Encounters:  11/08/20 213 lb 9.6 oz (96.9 kg)  10/31/20 213 lb (96.6 kg)  10/17/20 215 lb (97.5 kg)    General: Patient appears comfortable at rest. HEENT: Conjunctiva and lids normal, wearing a mask. Neck: Supple, no elevated JVP or carotid bruits, no thyromegaly. Lungs: Clear to auscultation, nonlabored breathing at rest. Cardiac: Regular rate and rhythm, no S3 or significant systolic murmur, no pericardial rub. Abdomen: Soft, nontender, bowel sounds present. Extremities: No pitting edema. Skin: Warm and dry. Musculoskeletal: No kyphosis. Neuropsychiatric: Alert and oriented x3, affect grossly appropriate.  ECG:  An  ECG dated 10/31/2020 was personally reviewed today and demonstrated:  Normal sinus rhythm, borderline prolonged PR interval.  Recent Labwork: 10/31/2020: ALT 15; AST 14; BUN 10; Creatinine, Ser 0.71; Hemoglobin 13.1; Platelets 271; Potassium 3.6; Sodium 134; TSH 1.397     Component Value Date/Time   CHOL 198 10/30/2019 1104   TRIG 52 10/30/2019 1104   HDL 58 10/30/2019 1104   CHOLHDL 3.4 10/30/2019 1104   LDLCALC 126 (H) 10/30/2019 1104    Other Studies Reviewed Today:  Chest x-ray 10/31/2020: FINDINGS: The heart size and mediastinal contours are within normal limits. Both lungs are clear. No pleural  effusion or pneumothorax. The visualized skeletal structures are unremarkable.  IMPRESSION: No active cardiopulmonary disease.  Assessment and Plan:  1.  Recurrent chest pain as outlined above in a 44 year old woman with hypertension on Norvasc, family history of premature CAD in her mother.  Recent ER visit did not demonstrate any acute findings in terms of ECG or chest x-ray, cardiac markers were also normal.  She has not undergone any prior ischemic testing and we will proceed with an exercise Myoview.  2.  Essential hypertension, currently on Norvasc.  3.  Hyperthyroidism, on methimazole.  Medication Adjustments/Labs and Tests Ordered: Current medicines are reviewed at length with the patient today.  Concerns regarding medicines are outlined above.   Tests Ordered: Orders Placed This Encounter  Procedures  . NM Myocar Multi W/Spect W/Wall Motion / EF    Medication Changes: No orders of the defined types were placed in this encounter.   Disposition:  Follow up test results.  Signed, Satira Sark, MD, [facility] 11/08/2020 11:16 AM    [facility] [place], [place], [place] [address] Phone: [phone]; Fax: [phone]

## 2020-11-08 ENCOUNTER — Encounter: Payer: Self-pay | Admitting: *Deleted

## 2020-11-08 ENCOUNTER — Other Ambulatory Visit: Payer: Self-pay

## 2020-11-08 ENCOUNTER — Telehealth: Payer: Self-pay | Admitting: Cardiology

## 2020-11-08 ENCOUNTER — Encounter: Payer: Self-pay | Admitting: Cardiology

## 2020-11-08 ENCOUNTER — Ambulatory Visit (INDEPENDENT_AMBULATORY_CARE_PROVIDER_SITE_OTHER): Payer: BC Managed Care – PPO | Admitting: Cardiology

## 2020-11-08 VITALS — BP 136/82 | HR 76 | Resp 16 | Ht 64.0 in | Wt 213.6 lb

## 2020-11-08 DIAGNOSIS — I10 Essential (primary) hypertension: Secondary | ICD-10-CM

## 2020-11-08 DIAGNOSIS — Z8249 Family history of ischemic heart disease and other diseases of the circulatory system: Secondary | ICD-10-CM

## 2020-11-08 DIAGNOSIS — R079 Chest pain, unspecified: Secondary | ICD-10-CM

## 2020-11-08 NOTE — Patient Instructions (Addendum)
Medication Instructions:   Your physician recommends that you continue on your current medications as directed. Please refer to the Current Medication list given to you today.  Labwork:  None  Testing/Procedures: Your physician has requested that you have en exercise stress myoview. For further information please visit [email or web address]. Please follow instruction sheet, as given.  Follow-Up:  Your physician recommends that you schedule a follow-up appointment in: pending.  Any Other Special Instructions Will Be Listed Below (If Applicable).  If you need a refill on your cardiac medications before your next appointment, please call your pharmacy.

## 2020-11-08 NOTE — Telephone Encounter (Signed)
Pre-cert Verification for the following procedure    EXERCISE STRESS TEST   DATE: 11/18/2020  [place]

## 2020-11-15 ENCOUNTER — Other Ambulatory Visit: Payer: Self-pay

## 2020-11-15 ENCOUNTER — Other Ambulatory Visit (HOSPITAL_COMMUNITY)
Admission: RE | Admit: 2020-11-15 | Discharge: 2020-11-15 | Disposition: A | Discharge status: Home / Self Care | Payer: BC Managed Care – PPO | Source: Ambulatory Visit | Attending: Internal Medicine | Admitting: Internal Medicine

## 2020-11-15 DIAGNOSIS — Z20822 Contact with and (suspected) exposure to covid-19: Secondary | ICD-10-CM | POA: Insufficient documentation

## 2020-11-15 DIAGNOSIS — Z01812 Encounter for preprocedural laboratory examination: Secondary | ICD-10-CM | POA: Insufficient documentation

## 2020-11-16 LAB — SARS CORONAVIRUS 2 (TAT 6-24 HRS): SARS Coronavirus 2: NEGATIVE

## 2020-11-18 ENCOUNTER — Other Ambulatory Visit: Payer: Self-pay

## 2020-11-18 ENCOUNTER — Encounter (HOSPITAL_COMMUNITY)
Admission: RE | Admit: 2020-11-18 | Discharge: 2020-11-18 | Disposition: A | Discharge status: Home / Self Care | Payer: BC Managed Care – PPO | Source: Ambulatory Visit | Attending: Cardiology | Admitting: Cardiology

## 2020-11-18 ENCOUNTER — Ambulatory Visit (HOSPITAL_COMMUNITY)
Admission: RE | Admit: 2020-11-18 | Discharge: 2020-11-18 | Disposition: A | Discharge status: Home / Self Care | Payer: BC Managed Care – PPO | Source: Ambulatory Visit | Attending: Cardiology | Admitting: Cardiology

## 2020-11-18 ENCOUNTER — Telehealth: Payer: Self-pay | Admitting: Cardiology

## 2020-11-18 ENCOUNTER — Telehealth: Payer: Self-pay | Admitting: *Deleted

## 2020-11-18 DIAGNOSIS — R079 Chest pain, unspecified: Secondary | ICD-10-CM | POA: Insufficient documentation

## 2020-11-18 DIAGNOSIS — I10 Essential (primary) hypertension: Secondary | ICD-10-CM

## 2020-11-18 LAB — NM MYOCAR MULTI W/SPECT W/WALL MOTION / EF
Estimated workload: 10.1 METS
Exercise duration (min): 7 min
Exercise duration (sec): 29 s
LV dias vol: 86 mL (ref 46–106)
LV sys vol: 45 mL
MPHR: 175 {beats}/min
Peak HR: 162 {beats}/min
Percent HR: 92 %
RATE: 0.34
RPE: 15
Rest HR: 73 {beats}/min
SDS: 7
SRS: 0
SSS: 7
TID: 1.14

## 2020-11-18 MED ORDER — TECHNETIUM TC 99M TETROFOSMIN IV KIT
10.0000 | PACK | Freq: Once | INTRAVENOUS | Status: AC | PRN
  Administered 2020-11-18: 11 via INTRAVENOUS

## 2020-11-18 MED ORDER — SODIUM CHLORIDE FLUSH 0.9 % IV SOLN
INTRAVENOUS | Status: AC
  Administered 2020-11-18: 10 mL via INTRAVENOUS
  Filled 2020-11-18: qty 10

## 2020-11-18 MED ORDER — TECHNETIUM TC 99M TETROFOSMIN IV KIT
30.0000 | PACK | Freq: Once | INTRAVENOUS | Status: AC | PRN
  Administered 2020-11-18: 33 via INTRAVENOUS

## 2020-11-18 MED ORDER — REGADENOSON 0.4 MG/5ML IV SOLN
INTRAVENOUS | Status: AC
  Filled 2020-11-18: qty 5

## 2020-11-18 NOTE — Telephone Encounter (Signed)
Pre-cert Verification for the following procedure    ECHO   DATE:   11/21/2020  LOCATION: [facility]

## 2020-11-18 NOTE — Telephone Encounter (Signed)
-----   Message from Satira Sark, MD sent at 11/18/2020  3:54 PM EST ----- Results reviewed.  Please let her know that the stress test was reassuring overall, no evidence of ischemia to suggest obstructive CAD as cause of her symptoms.  LVEF calculated at 48%, but possibly artifactually low.  Would clarify by getting an echocardiogram.

## 2020-11-18 NOTE — Telephone Encounter (Signed)
Patient informed. Copy sent to PCP °

## 2020-11-21 ENCOUNTER — Ambulatory Visit (HOSPITAL_COMMUNITY)
Admission: RE | Admit: 2020-11-21 | Discharge: 2020-11-21 | Disposition: A | Discharge status: Home / Self Care | Payer: BC Managed Care – PPO | Source: Ambulatory Visit | Attending: Cardiology | Admitting: Cardiology

## 2020-11-21 ENCOUNTER — Ambulatory Visit: Payer: BC Managed Care – PPO | Admitting: Cardiology

## 2020-11-21 ENCOUNTER — Other Ambulatory Visit: Payer: Self-pay

## 2020-11-21 DIAGNOSIS — R079 Chest pain, unspecified: Secondary | ICD-10-CM | POA: Diagnosis not present

## 2020-11-21 DIAGNOSIS — I10 Essential (primary) hypertension: Secondary | ICD-10-CM | POA: Diagnosis not present

## 2020-11-21 LAB — ECHOCARDIOGRAM COMPLETE
Area-P 1/2: 3.36 cm2
S' Lateral: 3 cm

## 2020-11-21 NOTE — Progress Notes (Signed)
*  PRELIMINARY RESULTS* Echocardiogram 2D Echocardiogram has been performed.  Samuel Germany 11/21/2020, 2:20 PM

## 2020-12-05 ENCOUNTER — Telehealth: Payer: Self-pay

## 2020-12-05 ENCOUNTER — Ambulatory Visit: Payer: BC Managed Care – PPO | Admitting: Adult Health

## 2020-12-05 NOTE — Telephone Encounter (Signed)
Patient is requesting a referral to have a colonoscopy no issues just age related p# [phone]

## 2020-12-05 NOTE — Telephone Encounter (Signed)
Pt has an appt next with Allena Katz will discuss at this visit

## 2020-12-09 ENCOUNTER — Ambulatory Visit (INDEPENDENT_AMBULATORY_CARE_PROVIDER_SITE_OTHER): Payer: BC Managed Care – PPO | Admitting: Internal Medicine

## 2020-12-09 ENCOUNTER — Encounter: Payer: Self-pay | Admitting: Internal Medicine

## 2020-12-09 ENCOUNTER — Other Ambulatory Visit: Payer: Self-pay

## 2020-12-09 VITALS — BP 119/81 | HR 73 | Resp 16 | Ht 64.0 in | Wt 210.1 lb

## 2020-12-09 DIAGNOSIS — E042 Nontoxic multinodular goiter: Secondary | ICD-10-CM

## 2020-12-09 DIAGNOSIS — E0590 Thyrotoxicosis, unspecified without thyrotoxic crisis or storm: Secondary | ICD-10-CM | POA: Diagnosis not present

## 2020-12-09 DIAGNOSIS — M542 Cervicalgia: Secondary | ICD-10-CM | POA: Diagnosis not present

## 2020-12-09 NOTE — Assessment & Plan Note (Signed)
Left > right, thyromegaly Check thyroid US as patient has left sided neck pain and occasional dysphagia

## 2020-12-09 NOTE — Patient Instructions (Signed)
Please get Ultrasound of the thyroid done as scheduled.  Okay to take Tylenol up to 3 times in a day for neck pain. Okay to apply heating pad locally for pain/swelling.  Please go to ER immediately if you have difficulty breathing.

## 2020-12-09 NOTE — Assessment & Plan Note (Signed)
Left sides neck pain, dull Has thyromegaly, would check thyroid US to r/o thyroid related pain

## 2020-12-09 NOTE — Progress Notes (Signed)
Established Patient Office Visit  Subjective:  Patient ID: Gina Coleman, female    DOB: [birth year]  Age: 45 y.o. MRN: [id number]  CC:  Chief Complaint  Patient presents with  . Neck Pain    Started Thursday, no trauma, has tried Ibuprofen but it hasn't help.     HPI Gina Coleman a 45 year old female with PMH of hypertension and subclinical hyperthyroidism who presents for evaluation for neck pain.  She c/o left sided neck pain, which is intermittent, dull.Of note, she has h/o multinodular goiter and subclinical hyperthyroidism.  She has been taking methimazole regularly and follows up with endocrinologist.  She also complains of occasional dysphagia, but denies dysphonia or dyspnea.  She has been applying heating pads and taking ibuprofen to help treat the neck pain.  Past Medical History:  Diagnosis Date  . Hypertension   . Hyperthyroidism     Past Surgical History:  Procedure Laterality Date  . FOOT SURGERY Right   . LEG SURGERY Right   . TUBAL LIGATION      Family History  Problem Relation Age of Onset  . CAD Mother        Premature disease  . Hypertension Sister   . Breast cancer Maternal Grandmother   . HIV Sister   . Asthma Son     Social History   Socioeconomic History  . Marital status: Married    Spouse name: Not on file  . Number of children: Not on file  . Years of education: Not on file  . Highest education level: Not on file  Occupational History  . Not on file  Tobacco Use  . Smoking status: Former Smoker    Types: Cigarettes    Quit date: 07/06/2019    Years since quitting: 1.4  . Smokeless tobacco: Never Used  Vaping Use  . Vaping Use: Never used  Substance and Sexual Activity  . Alcohol use: Yes    Comment: social   . Drug use: Never  . Sexual activity: Yes    Birth control/protection: Surgical    Comment: tubal  Other Topics Concern  . Not on file  Social History Narrative  . Not on file   Social Determinants of Health    Financial Resource Strain: Low Risk   . Difficulty of Paying Living Expenses: Not hard at all  Food Insecurity: No Food Insecurity  . Worried About Charity fundraiser in the Last Year: Never true  . Ran Out of Food in the Last Year: Never true  Transportation Needs: No Transportation Needs  . Lack of Transportation (Medical): No  . Lack of Transportation (Non-Medical): No  Physical Activity: Insufficiently Active  . Days of Exercise per Week: 3 days  . Minutes of Exercise per Session: 30 min  Stress: Stress Concern Present  . Feeling of Stress : To some extent  Social Connections: Moderately Integrated  . Frequency of Communication with Friends and Family: More than three times a week  . Frequency of Social Gatherings with Friends and Family: More than three times a week  . Attends Religious Services: 1 to 4 times per year  . Active Member of Clubs or Organizations: No  . Attends Archivist Meetings: Never  . Marital Status: Married  Human resources officer Violence: Not At Risk  . Fear of Current or Ex-Partner: No  . Emotionally Abused: No  . Physically Abused: No  . Sexually Abused: No    Outpatient Medications Prior to Visit  Medication Sig  Dispense Refill  . amLODipine (NORVASC) 5 MG tablet Take 1 tablet (5 mg total) by mouth daily. 90 tablet 1  . famotidine (PEPCID) 20 MG tablet Take 1 tablet (20 mg total) by mouth daily. 30 tablet 0  . methimazole (TAPAZOLE) 5 MG tablet Take 5 mg by mouth 2 (two) times daily.    . Omega-3 Fatty Acids (FISH OIL PO) Take by mouth.     No facility-administered medications prior to visit.    Allergies  Allergen Reactions  . Latex   . Other     Other reaction(s): Other GLOVE POWDER = RASH GLOVE POWDER = RASH GLOVE POWDER = RASH     ROS Review of Systems  Constitutional: Negative for chills and fever.  HENT: Negative for congestion, sinus pressure, sinus pain and sore throat.   Eyes: Negative for pain and discharge.   Respiratory: Negative for cough and shortness of breath.   Cardiovascular: Negative for chest pain and palpitations.  Gastrointestinal: Negative for abdominal pain, constipation, diarrhea, nausea and vomiting.  Endocrine: Negative for polydipsia and polyuria.  Genitourinary: Negative for dysuria and hematuria.  Musculoskeletal: Positive for neck pain. Negative for neck stiffness.  Skin: Negative for rash.  Neurological: Negative for dizziness and weakness.  Psychiatric/Behavioral: Negative for agitation and behavioral problems.      Objective:    Physical Exam Vitals reviewed.  Constitutional:      General: She is not in acute distress.    Appearance: She is obese. She is not diaphoretic.  HENT:     Head: Normocephalic and atraumatic.     Nose: Nose normal. No congestion.     Mouth/Throat:     Mouth: Mucous membranes are moist.     Pharynx: No posterior oropharyngeal erythema.  Eyes:     General: No scleral icterus.    Extraocular Movements: Extraocular movements intact.     Pupils: Pupils are equal, round, and reactive to light.  Neck:     Thyroid: Thyromegaly present. No thyroid tenderness.     Comments: Neck mass, Right > Left, likely thyroid nodule Cardiovascular:     Rate and Rhythm: Normal rate and regular rhythm.     Heart sounds: No murmur heard. No friction rub. No gallop.   Pulmonary:     Breath sounds: Normal breath sounds. No wheezing or rales.  Abdominal:     Palpations: Abdomen is soft.     Tenderness: There is no abdominal tenderness.  Musculoskeletal:     Cervical back: Normal range of motion. No rigidity or tenderness.     Right lower leg: No edema.     Left lower leg: No edema.  Skin:    General: Skin is warm.     Findings: No rash.  Neurological:     General: No focal deficit present.     Mental Status: She is alert and oriented to person, place, and time.     Sensory: No sensory deficit.     Motor: No weakness.  Psychiatric:        Mood and  Affect: Mood normal.        Behavior: Behavior normal.     BP 119/81   Pulse 73   Resp 16   Ht 5\' 4"  (1.626 m)   Wt 210 lb 1.9 oz (95.3 kg)   LMP 11/17/2020 (Exact Date)   SpO2 99%   BMI 36.07 kg/m  Wt Readings from Last 3 Encounters:  12/09/20 210 lb 1.9 oz (95.3 kg)  11/08/20 213  lb 9.6 oz (96.9 kg)  10/31/20 213 lb (96.6 kg)     Health Maintenance Due  Topic Date Due  . Hepatitis C Screening  Never done  . HIV Screening  Never done  . TETANUS/TDAP  Never done  . COLONOSCOPY (Pts 45-33yrs Insurance coverage will need to be confirmed)  Never done    There are no preventive care reminders to display for this patient.  Lab Results  Component Value Date   TSH 1.397 10/31/2020   Lab Results  Component Value Date   WBC 4.9 10/31/2020   HGB 13.1 10/31/2020   HCT 41.8 10/31/2020   MCV 90.1 10/31/2020   PLT 271 10/31/2020   Lab Results  Component Value Date   NA 134 (L) 10/31/2020   K 3.6 10/31/2020   CO2 26 10/31/2020   GLUCOSE 121 (H) 10/31/2020   BUN 10 10/31/2020   CREATININE 0.71 10/31/2020   BILITOT 0.6 10/31/2020   ALKPHOS 112 10/31/2020   AST 14 (L) 10/31/2020   ALT 15 10/31/2020   PROT 7.8 10/31/2020   ALBUMIN 3.8 10/31/2020   CALCIUM 8.9 10/31/2020   ANIONGAP 7 10/31/2020   GFR 69.56 05/13/2020   Lab Results  Component Value Date   CHOL 198 10/30/2019   Lab Results  Component Value Date   HDL 58 10/30/2019   Lab Results  Component Value Date   LDLCALC 126 (H) 10/30/2019   Lab Results  Component Value Date   TRIG 52 10/30/2019   Lab Results  Component Value Date   CHOLHDL 3.4 10/30/2019   No results found for: HGBA1C    Assessment & Plan:   Problem List Items Addressed This Visit      Endocrine   Multinodular goiter - Primary    Left > right, thyromegaly Check thyroid US as patient has left sided neck pain and occasional dysphagia      Relevant Orders   US THYROID   Subclinical hyperthyroidism    On Methimazole 5 mg  BID Follows Endocrinology Last TSH and T4 wnl        Other   Neck pain    Left sides neck pain, dull Has thyromegaly, would check thyroid US to r/o thyroid related pain         No orders of the defined types were placed in this encounter.   Follow-up: Return if symptoms worsen or fail to improve.    Lindell Spar, MD

## 2020-12-09 NOTE — Assessment & Plan Note (Signed)
On Methimazole 5 mg BID Follows Endocrinology Last TSH and T4 wnl 

## 2020-12-11 ENCOUNTER — Ambulatory Visit: Payer: BC Managed Care – PPO | Admitting: Internal Medicine

## 2020-12-11 ENCOUNTER — Other Ambulatory Visit: Payer: Self-pay

## 2020-12-11 ENCOUNTER — Encounter: Payer: Self-pay | Admitting: Internal Medicine

## 2020-12-11 VITALS — BP 118/78 | HR 82 | Ht 64.0 in | Wt 211.5 lb

## 2020-12-11 DIAGNOSIS — E042 Nontoxic multinodular goiter: Secondary | ICD-10-CM | POA: Diagnosis not present

## 2020-12-11 DIAGNOSIS — E0590 Thyrotoxicosis, unspecified without thyrotoxic crisis or storm: Secondary | ICD-10-CM | POA: Diagnosis not present

## 2020-12-11 NOTE — Progress Notes (Signed)
Name: Gina Coleman  MRN/ DOB: [id number], [birth year]    Age/ Sex: 45 y.o., female     PCP: Lindell Spar, MD   Reason for Endocrinology Evaluation: [place]     Initial Endocrinology Clinic Visit: 07/12/2019    PATIENT IDENTIFIER: Gina Coleman is a 45 y.o., female with a past medical history of HTN. She has followed with [facility] since 07/12/2019 for consultative assistance with management of her 07/12/2019.   HISTORICAL SUMMARY: The patient was first  noted to have a low TSH at 0.399 uIU/mL during an evaluation for an enlarged cervical lymph node in 05/2019. She was started on Abx at the time.  Repeat TFT's were normal in 07/2019 . Ultrasound showed MNG . She is S/P benign FNA of the RUP nodule 08/2019   TRAb was undetectable  Methimazole started 03/2020 due to symptoms of hyperthyroidism    No FH of thyroid disorder  SUBJECTIVE:    Today (12/11/2020):  [name] is here for a follow up on subclinical hyperthyroidism. S/P FNA of the RUP nodule with benign cytology in 08/2019  She feels that she has a frog in her throat, no congestion, but has heartburn. She is on Pepcid which helps.   Denies fever but has occasional abdominal pain, has a fibroid- followed by GYN   Anxiety is worse    She is on Methimazole 5 mg, once daily     HISTORY:  Past Medical History:  Past Medical History:  Diagnosis Date   Hypertension    Hyperthyroidism    Past Surgical History:  Past Surgical History:  Procedure Laterality Date   FOOT SURGERY Right    LEG SURGERY Right    TUBAL LIGATION     Social History:  reports that she quit smoking about 17 months ago. Her smoking use included cigarettes. She has never used smokeless tobacco. She reports current alcohol use. She reports that she does not use drugs. Family History:  Family History  Problem Relation Age of Onset   CAD Mother        Premature disease   Hypertension Sister    Breast cancer  Maternal Grandmother    HIV Sister    Asthma Son      HOME MEDICATIONS: Allergies as of 12/11/2020      Reactions   Latex    Other    Other reaction(s): Other GLOVE POWDER = RASH GLOVE POWDER = RASH GLOVE POWDER = RASH      Medication List       Accurate as of December 11, 2020 10:06 AM. If you have any questions, ask your nurse or doctor.        amLODipine 5 MG tablet Commonly known as: NORVASC Take 1 tablet (5 mg total) by mouth daily.   famotidine 20 MG tablet Commonly known as: PEPCID Take 1 tablet (20 mg total) by mouth daily.   FISH OIL PO Take by mouth.   methimazole 5 MG tablet Commonly known as: TAPAZOLE Take 5 mg by mouth 2 (two) times daily.         OBJECTIVE:   PHYSICAL EXAM: VS: BP 118/78    Pulse 82    Ht 5\' 4"  (1.626 m)    Wt 211 lb 8 oz (95.9 kg)    LMP 11/17/2020 (Exact Date)    SpO2 98%    BMI 36.30 kg/m    EXAM: General: Pt appears well and is in NAD  Neck: General: Supple without adenopathy.  Thyroid: Right thyroid  nodule appreciated.   Lungs: Clear with good BS bilat with no rales, rhonchi, or wheezes  Heart: Auscultation: RRR.  Abdomen: Normoactive bowel sounds, soft, nontender, without masses or organomegaly palpable  Extremities:  BL LE: No pretibial edema normal ROM and strength.  Mental Status: Judgment, insight: Intact Orientation: Oriented to time, place, and person Mood and affect: No depression, anxiety, or agitation     DATA REVIEWED:  Results for TIEARA, FLITTON (MRN [id number]) as of 12/12/2020 11:43  Ref. Range 10/31/2020 09:00  TSH Latest Ref Range: 0.350 - 4.500 uIU/mL 1.397   Thyroid Ultrasound 08/07/2020  Estimated total number of nodules >/= 1 cm: 2  Number of spongiform nodules >/=  2 cm not described below (TR1): 0  Number of mixed cystic and solid nodules >/= 1.5 cm not described below (TR2): 0  _________________________________________________________  There is an approximately 0.9 cm  partially cystic though predominantly solid hypoechoic nodule within the thyroid isthmus (labeled 1) which is unchanged compared to the 05/2019 examination, and again does not meet criteria to recommend percutaneous sampling or continued dedicated follow-up.  _________________________________________________________  Nodule # 2:  Prior biopsy: No  Location: Isthmus; Inferior  Maximum size: 1.7 cm; Other 2 dimensions: 1.3 x 1.3 cm, previously, 1.9 x 1.7 x 1.1 cm  Composition: solid/almost completely solid (2)  Echogenicity: isoechoic (1)  Shape: not taller-than-wide (0)  Margins: ill-defined (0)  Echogenic foci: none (0)  ACR TI-RADS total points: 3.  ACR TI-RADS risk category:  TR3 (3 points).  Significant change in size (>/= 20% in two dimensions and minimal increase of 2 mm): No  Change in features: No  Change in ACR TI-RADS risk category: No  ACR TI-RADS recommendations:  *Given size (>/= 1.5 - 2.4 cm) and appearance, a follow-up ultrasound in 1 year should be considered based on TI-RADS criteria.  _________________________________________________________  The previously biopsied approximately 3.9 x 2.8 x 1.4 cm hypoechoic nodule within the posterosuperior aspect the right lobe of the thyroid (labeled 3), is unchanged compared to the 05/2019 examination, previously, 3.9 x 2.8 x 1.4 cm. Correlation with previous biopsy results is advised.  _________________________________________________________  There is a punctate (approximately 0.9 cm) hypoechoic nodule within the inferior pole the right lobe of the thyroid (labeled 4), which is unchanged in hind site compared to the 05/2019 examination and again does not meet criteria to recommend percutaneous sampling or continued dedicated follow-up.  The approximately 0.9 cm hypoechoic nodule within the inferior pole the right lobe of the thyroid (labeled 5), is unchanged compared to the  05/2019 examination and again does not meet criteria to recommend percutaneous sampling or continued dedicated follow-up.  _________________________________________________________  There is an approximately 0.7 cm isoechoic nodule/pseudonodule within the left lobe of the thyroid (labeled 6), not seen on the 05/2019 examination though does not meet criteria to recommend percutaneous sampling or continued dedicated follow-up.  IMPRESSION: 1. Findings suggestive of multinodular goiter. No worrisome new or enlarging thyroid nodules. 2. Previously biopsied dominant right-sided thyroid nodule (labeled #3), is unchanged compared to the 05/2019 examination. Correlation with previous biopsy results is advised. Assuming a benign pathologic diagnosis, repeat sampling and/or continued dedicated follow-up is not recommended. 3. Nodule #2 is unchanged compared to the 05/2019 examination though again meets imaging criteria to recommend annual/biannual surveillance. This examination documents 1 year stability.    FNA (08/02/2019)   THYROID, FINE NEEDLE ASPIRATION, RUP (SPECIMEN 1 OF 1, COLLECTED 08/02/19): CONSISTENT WITH BENIGN FOLLICULAR NODULE (BETHESDA CATEGORY II).  ASSESSMENT / PLAN / RECOMMENDATIONS:   Multinodular Goiter :   - No local neck symptoms  - S/P Benign FNA of the Right upper nodule 08/2019 - A repeat thyroid ultrasound shows stability     2. Hyperthyroidism :   -Pt is clinically and biochemically euthyroid  - She was treated because she was symptomatic  - Most likely secondary to autonomous nodule  -  No changes today    Medication: Methimazole 5 mg 1 tablet daily     3. Anxiety:   - I have strongly encouraged her to seek treatment for this, which she is in agreement of. She will discuss with her PCP   F/U in 6 months     Signed electronically by: Mack Guise, MD  [facility]  [facility] [address]  [place], [place] [address] Phone: [phone] FAX: [phone]      CC: Lindell Spar, [place] [address] Phone: [phone]  Fax: [phone]   Return to Endocrinology clinic as below: Future Appointments  Date Time Provider [place]  12/18/2020  3:00 PM Estill Dooms, NP [facility]  12/19/2020  2:40 PM [facility] DIAG [place] 1 [facility]  12/19/2020  2:50 PM [facility] [place] 1 [facility]  02/11/2021  1:20 PM Lindell Spar, MD [facility] R[facility]

## 2020-12-11 NOTE — Patient Instructions (Signed)
-   Continue methimazole 5 mg, 1 tablet daily

## 2020-12-17 ENCOUNTER — Ambulatory Visit (HOSPITAL_COMMUNITY): Admission: RE | Admit: 2020-12-17 | Payer: BC Managed Care – PPO | Source: Ambulatory Visit

## 2020-12-18 ENCOUNTER — Ambulatory Visit: Payer: BC Managed Care – PPO | Admitting: Adult Health

## 2020-12-19 ENCOUNTER — Other Ambulatory Visit: Payer: BC Managed Care – PPO

## 2020-12-24 ENCOUNTER — Ambulatory Visit (HOSPITAL_COMMUNITY)
Admission: RE | Admit: 2020-12-24 | Discharge: 2020-12-24 | Disposition: A | Discharge status: Home / Self Care | Payer: BC Managed Care – PPO | Source: Ambulatory Visit | Attending: Internal Medicine | Admitting: Internal Medicine

## 2020-12-24 ENCOUNTER — Other Ambulatory Visit: Payer: Self-pay

## 2020-12-24 DIAGNOSIS — E042 Nontoxic multinodular goiter: Secondary | ICD-10-CM | POA: Diagnosis not present

## 2020-12-24 DIAGNOSIS — E041 Nontoxic single thyroid nodule: Secondary | ICD-10-CM | POA: Diagnosis not present

## 2021-01-01 ENCOUNTER — Encounter: Payer: Self-pay | Admitting: Adult Health

## 2021-01-01 ENCOUNTER — Other Ambulatory Visit: Payer: Self-pay

## 2021-01-01 ENCOUNTER — Ambulatory Visit: Payer: BC Managed Care – PPO | Admitting: Adult Health

## 2021-01-01 VITALS — BP 120/81 | HR 85 | Ht 64.0 in | Wt 213.5 lb

## 2021-01-01 DIAGNOSIS — N920 Excessive and frequent menstruation with regular cycle: Secondary | ICD-10-CM

## 2021-01-01 DIAGNOSIS — N6002 Solitary cyst of left breast: Secondary | ICD-10-CM | POA: Diagnosis not present

## 2021-01-01 DIAGNOSIS — N946 Dysmenorrhea, unspecified: Secondary | ICD-10-CM | POA: Diagnosis not present

## 2021-01-01 DIAGNOSIS — D252 Subserosal leiomyoma of uterus: Secondary | ICD-10-CM | POA: Diagnosis not present

## 2021-01-01 MED ORDER — MEDROXYPROGESTERONE ACETATE 2.5 MG PO TABS
ORAL_TABLET | ORAL | 3 refills | Status: DC
Start: 2021-01-01 — End: 2021-06-05

## 2021-01-01 NOTE — Progress Notes (Signed)
  Subjective:     Patient ID: Gina Coleman, female   DOB: Apr 03, 1976, 45 y.o.   MRN: [id number]  HPI Gina Coleman is a 45 year old black female, married [id number] back in complaining of heavy periods, and painful periods, has small fibroid, she stopped aygestin due to nausea but it did help. She needs follow up Diagnostic mammogram and [place] on left breast and wants to go to [facility], she does not drive on highway she says.  PCP is Dr Posey Pronto.  Review of Systems Aygestin helped with bleeding but she had nausea with it,so she stopped taking Has heavy periods and painful periods  Reviewed past medical,surgical, social and family history. Reviewed medications and allergies.     Objective:   Physical Exam BP 120/81 (BP Location: Left Arm, Patient Position: Sitting, Cuff Size: Normal)   Pulse 85   Ht 5\' 4"  (1.626 m)   Wt 213 lb 8 oz (96.8 kg)   LMP 12/14/2020 (Approximate)   BMI 36.65 kg/m  Skin warm and dry. Lungs: clear to ausculation bilaterally. Cardiovascular: regular rate and rhythm.   Fall risk is low  Upstream - 01/01/21 1455      Pregnancy Intention Screening   Does the patient want to become pregnant in the next year? No    Does the patient's partner want to become pregnant in the next year? No    Would the patient like to discuss contraceptive options today? No      Contraception Wrap Up   Current Method Female Sterilization    End Method Female Sterilization    Contraception Counseling Provided No          Assessment:     1. Menorrhagia with regular cycle Will try provera daily, if does not work then [employer], she is not ready for surgery yet but declines IUD. Meds ordered this encounter  Medications  . medroxyPROGESTERone (PROVERA) 2.5 MG tablet    Sig: Take 1 daily at bedtime    Dispense:  30 tablet    Refill:  3    Order Specific Question:   Supervising Provider    Answer:   Elonda Husky, LUTHER H [[id number]]   2. Dysmenorrhea  3. Fibroids, subserous   4. Breast cyst,  left Will get diagnotic left mammogram and [place] at [facility] 01/21/21 at 3 pm    Plan:     Follow up in 8 weeks

## 2021-01-10 IMAGING — US US BREAST*L* LIMITED INC AXILLA
1 series · 13 of 20 positions shown · non-contrast
Comparison: Previous exam(s).

CLINICAL DATA: Screening recall for a possible left breast mass.

EXAM:
DIGITAL DIAGNOSTIC UNILATERAL LEFT MAMMOGRAM WITH TOMO AND CAD;
ULTRASOUND LEFT BREAST LIMITED

[Series 1: us breast*left* limited inc axilla · 0.06mm/px · 13 of 20 slices shown]
[im 1/20]
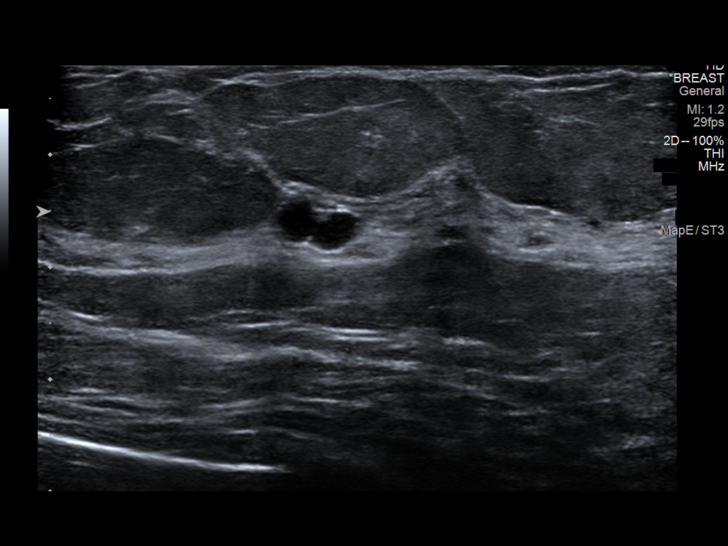
[im 3/20]
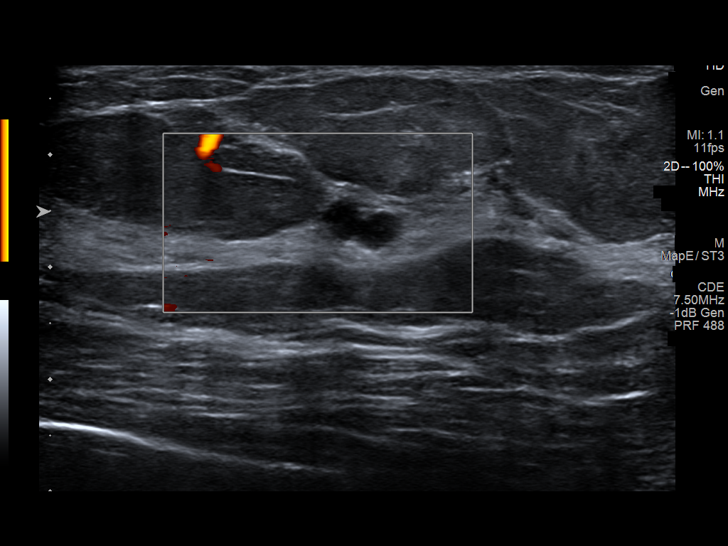
[im 4/20]
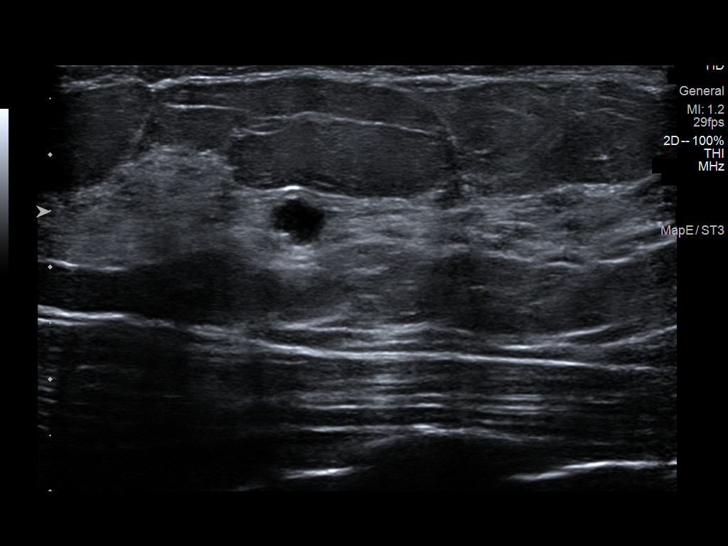
[im 6/20]
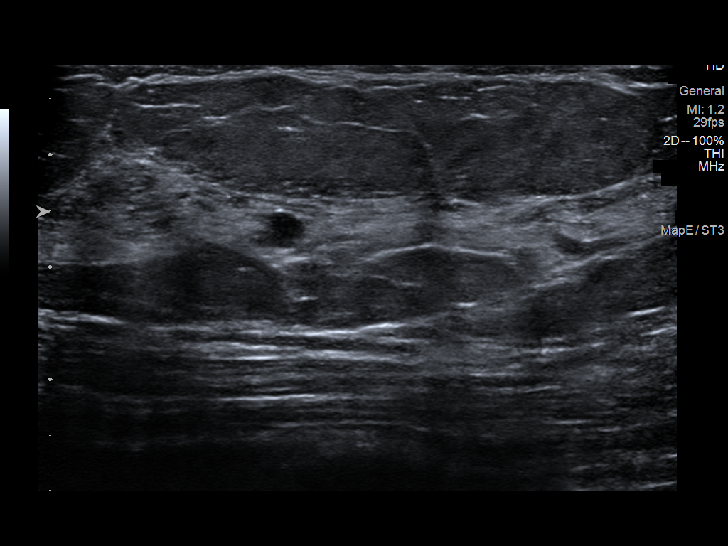
[im 7/20]
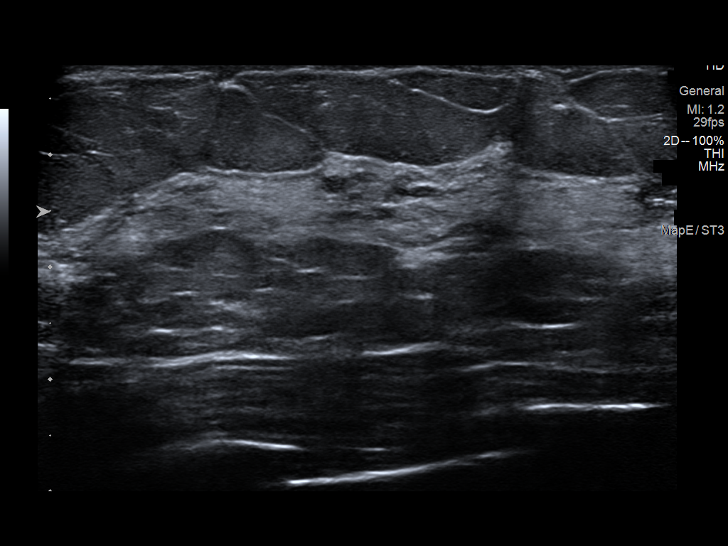
[im 9/20]
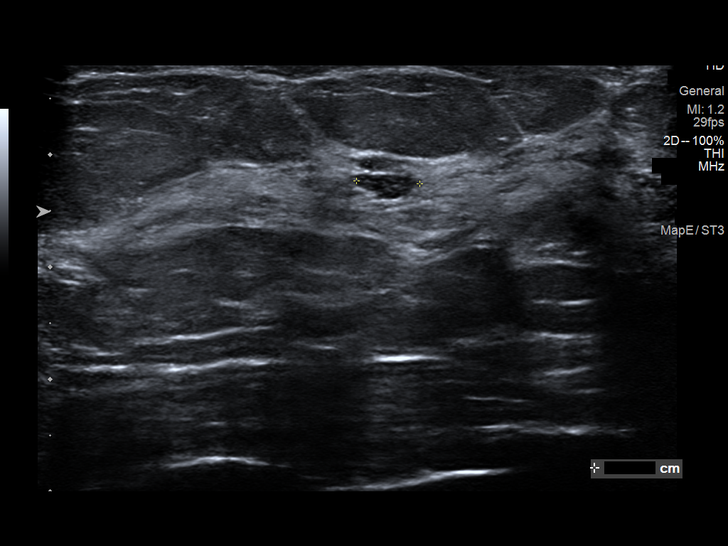
[im 11/20]
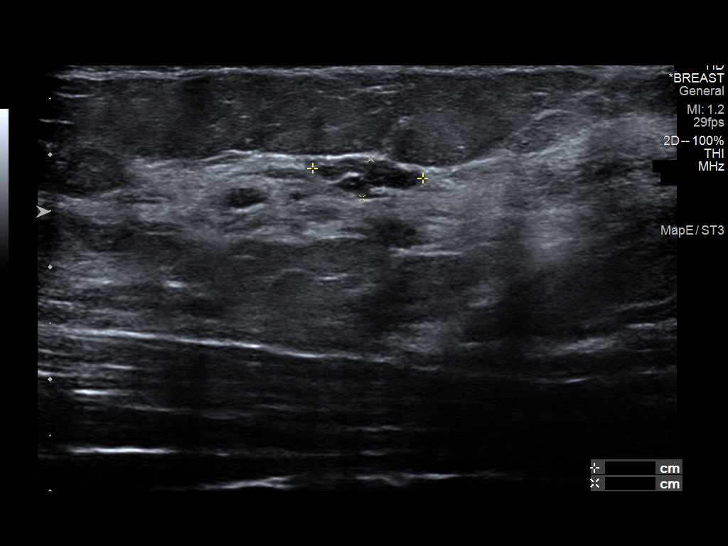
[im 12/20]
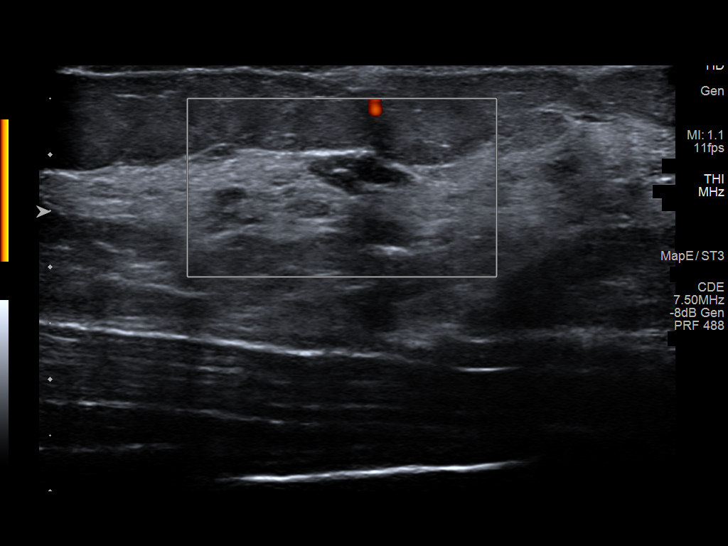
[im 14/20]
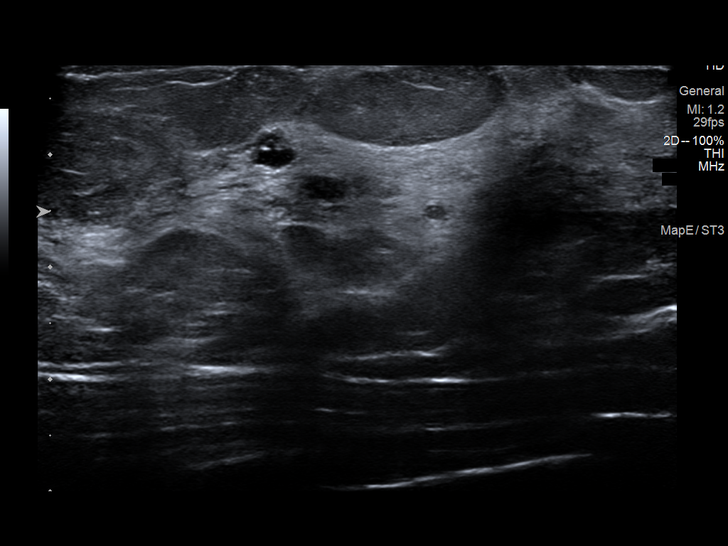
[im 15/20]
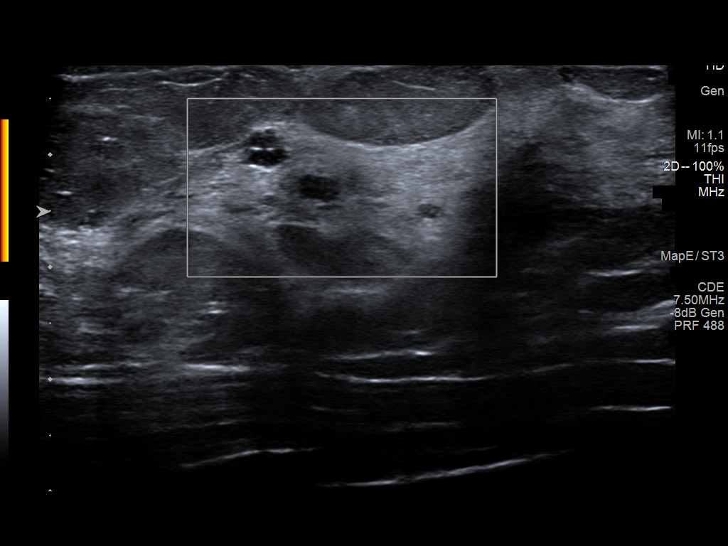
[im 17/20]
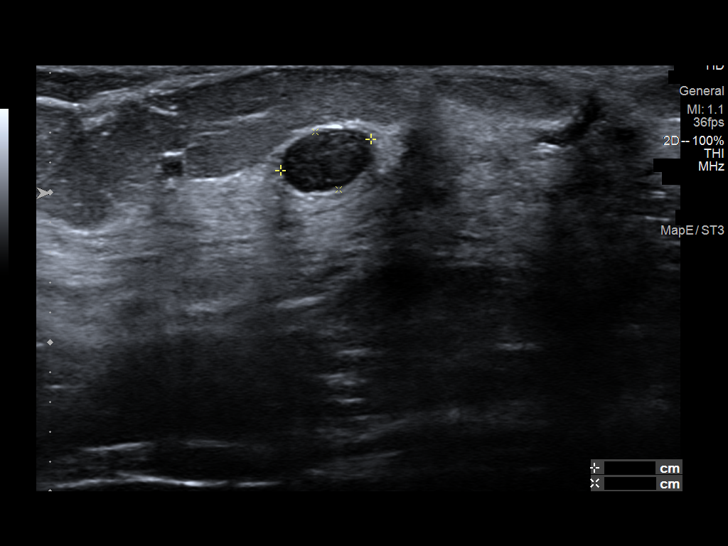
[im 18/20]
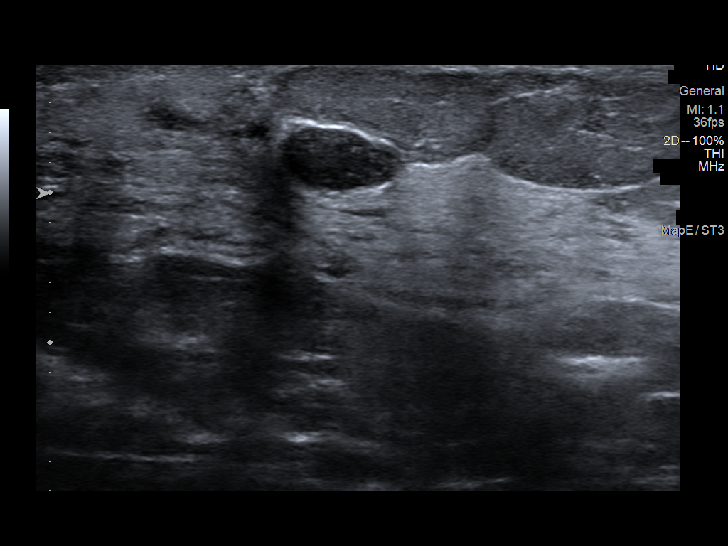
[im 20/20]
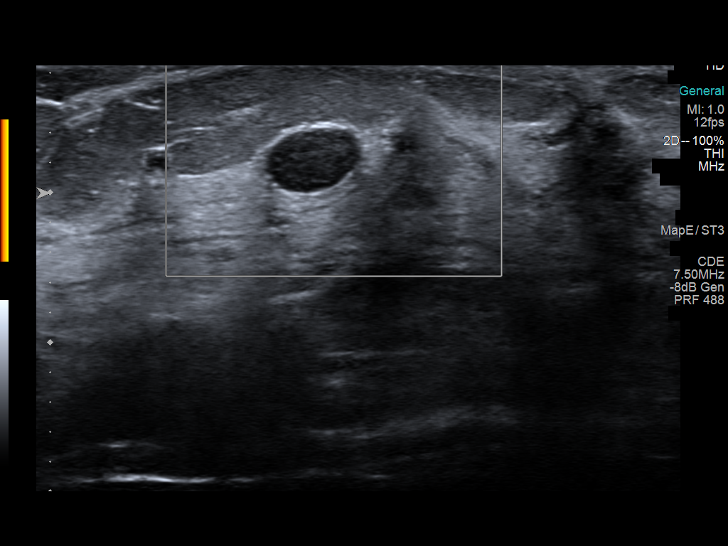

[13 of 20 positions shown; findings below may reference images not displayed]

ACR Breast Density Category c: The breast tissue is heterogeneously
dense, which may obscure small masses.
FINDINGS: Spot compression tomosynthesis images and full paddle true lateral
tomosynthesis images through the upper inner posterior left breast
demonstrates no definite persistent masses. However, the patient's
breast tissue is very dense in this region, and therefore ultrasound
will be performed for further evaluation.

Mammographic images were processed with CAD.

Ultrasound targeted to the left breast at [DATE], 4 cm from the
nipple demonstrates a near anechoic bilobed mass measuring 0.7 x
x 0.5 cm. This likely corresponds with the mass seen on the
mammogram.

Ultrasound targeted to the left breast at 1 o'clock, 4 cm from the
nipple demonstrates an oval hypoechoic mass with septations
measuring is 1.0 x 0.4 x 0.6 cm. This may represent a fibroadenoma
or a cluster of complicated cysts.

Ultrasound of the left breast at 1 o'clock, 2 cm from the nipple
demonstrates a few scattered benign-appearing cysts.

Ultrasound of the left breast at 1 o'clock in the retroareolar
breast demonstrates a circumscribed oval hypoechoic mass measuring
0.8 x 0.4 x 0.6 cm, favored to represent a fibroadenoma.
IMPRESSION: 1. There are 3 likely benign masses in the left breast, one at
[DATE], one at 1 o'clock, 4 cm from the nipple and another 1 o'clock
in the retroareolar breast.

RECOMMENDATION:
Six-month follow-up left breast mammogram and ultrasound.

I have discussed the findings and recommendations with the patient.
If applicable, a reminder letter will be sent to the patient
regarding the next appointment.

BI-RADS CATEGORY  3: Probably benign.

## 2021-01-10 IMAGING — MG MM DIGITAL DIAGNOSTIC UNILAT*L* W/ TOMO W/ CAD
4 series · 4 of 12 positions shown · non-contrast
Comparison: Previous exam(s).

CLINICAL DATA: Screening recall for a possible left breast mass.

EXAM:
DIGITAL DIAGNOSTIC UNILATERAL LEFT MAMMOGRAM WITH TOMO AND CAD;
ULTRASOUND LEFT BREAST LIMITED

[L MLO synth-2D]
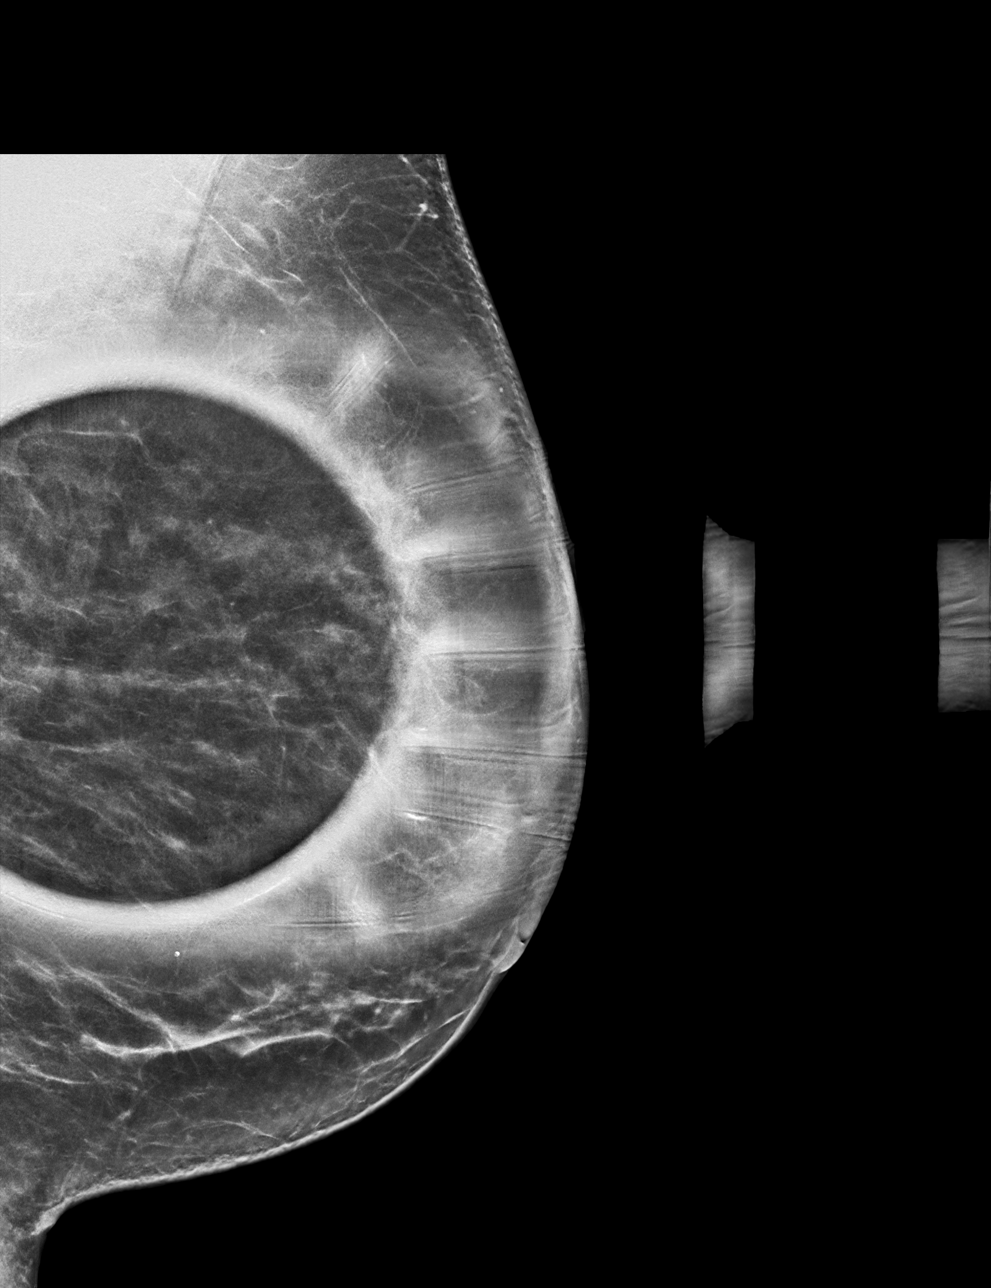

[L ML synth-2D]
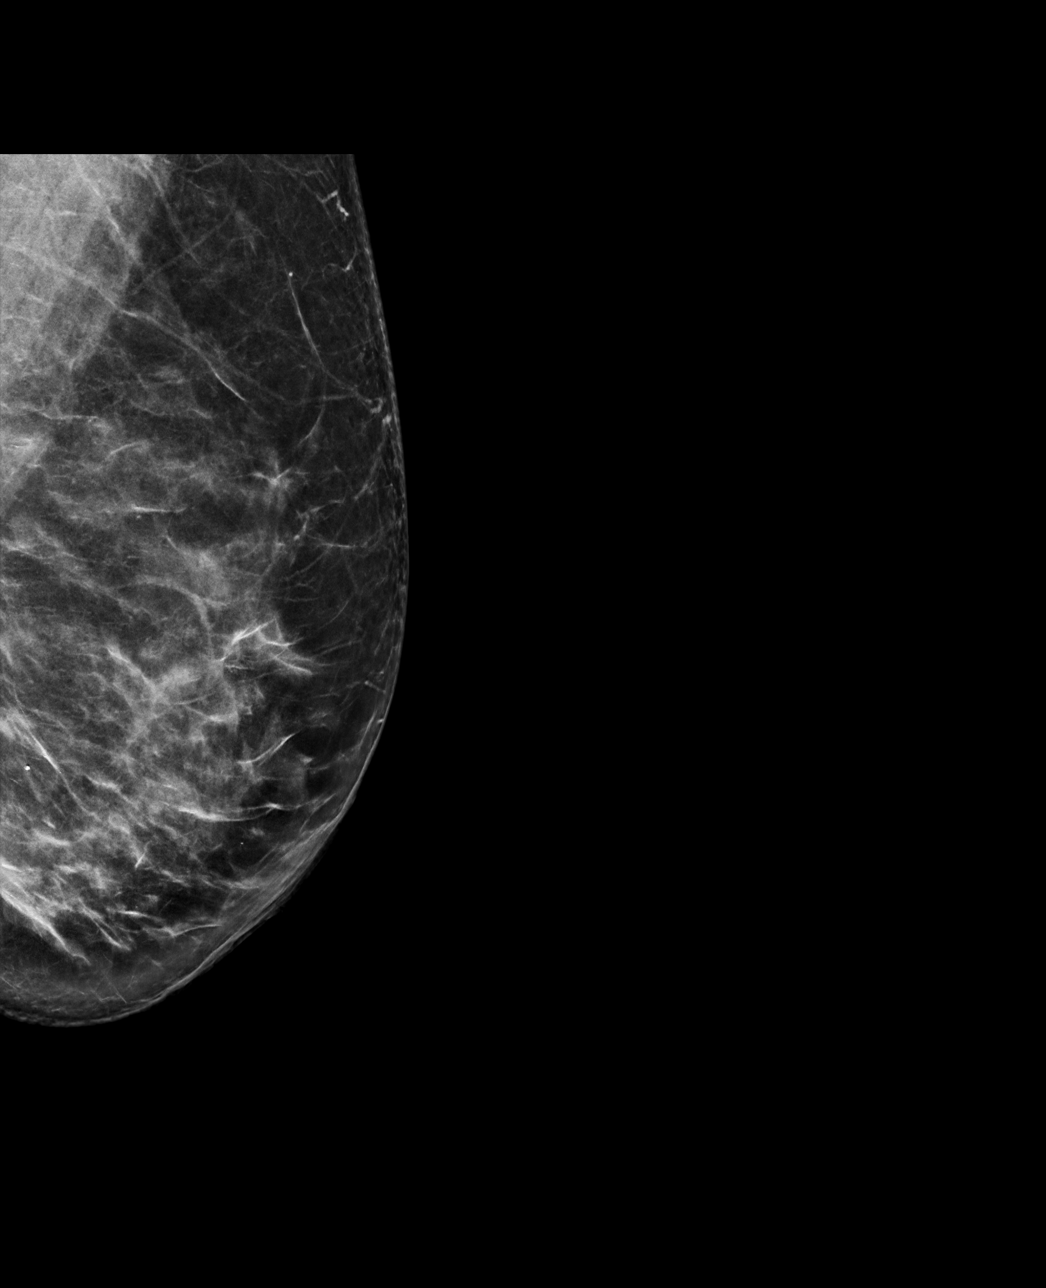

[L MLO tomo · tomo slice 35/68.0]
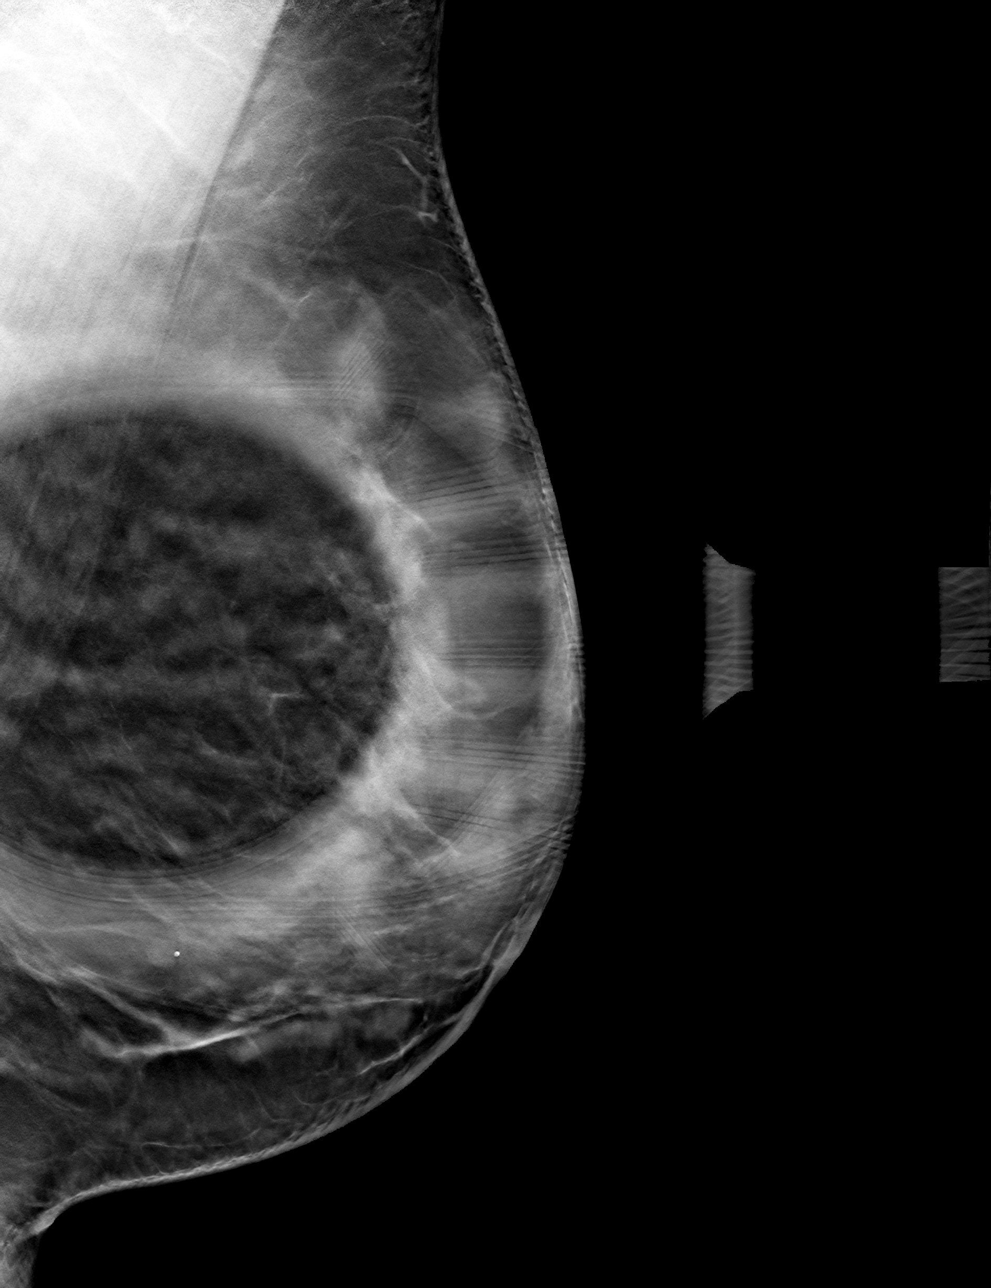

[L ML tomo · tomo slice 43/85.0]
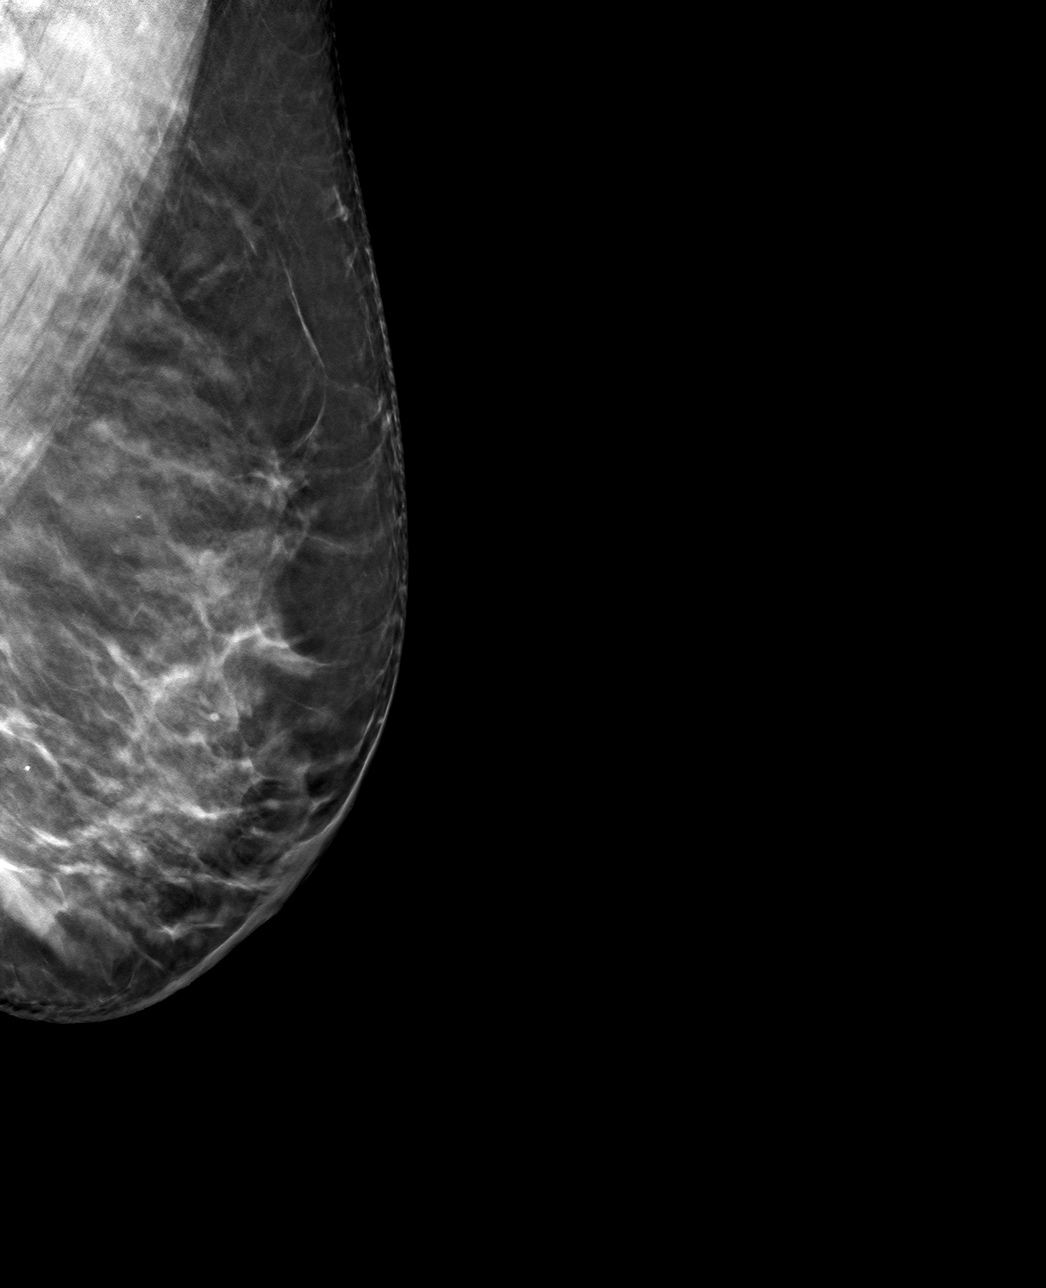

[4 of 12 positions shown; findings below may reference images not displayed]

ACR Breast Density Category c: The breast tissue is heterogeneously
dense, which may obscure small masses.
FINDINGS: Spot compression tomosynthesis images and full paddle true lateral
tomosynthesis images through the upper inner posterior left breast
demonstrates no definite persistent masses. However, the patient's
breast tissue is very dense in this region, and therefore ultrasound
will be performed for further evaluation.

Mammographic images were processed with CAD.

Ultrasound targeted to the left breast at [DATE], 4 cm from the
nipple demonstrates a near anechoic bilobed mass measuring 0.7 x
x 0.5 cm. This likely corresponds with the mass seen on the
mammogram.

Ultrasound targeted to the left breast at 1 o'clock, 4 cm from the
nipple demonstrates an oval hypoechoic mass with septations
measuring is 1.0 x 0.4 x 0.6 cm. This may represent a fibroadenoma
or a cluster of complicated cysts.

Ultrasound of the left breast at 1 o'clock, 2 cm from the nipple
demonstrates a few scattered benign-appearing cysts.

Ultrasound of the left breast at 1 o'clock in the retroareolar
breast demonstrates a circumscribed oval hypoechoic mass measuring
0.8 x 0.4 x 0.6 cm, favored to represent a fibroadenoma.
IMPRESSION: 1. There are 3 likely benign masses in the left breast, one at
[DATE], one at 1 o'clock, 4 cm from the nipple and another 1 o'clock
in the retroareolar breast.

RECOMMENDATION:
Six-month follow-up left breast mammogram and ultrasound.

I have discussed the findings and recommendations with the patient.
If applicable, a reminder letter will be sent to the patient
regarding the next appointment.

BI-RADS CATEGORY  3: Probably benign.

## 2021-01-21 ENCOUNTER — Other Ambulatory Visit: Payer: Self-pay

## 2021-01-21 ENCOUNTER — Ambulatory Visit (HOSPITAL_COMMUNITY)
Admission: RE | Admit: 2021-01-21 | Discharge: 2021-01-21 | Disposition: A | Discharge status: Home / Self Care | Payer: BC Managed Care – PPO | Source: Ambulatory Visit | Attending: Adult Health | Admitting: Adult Health

## 2021-01-21 DIAGNOSIS — R922 Inconclusive mammogram: Secondary | ICD-10-CM | POA: Diagnosis not present

## 2021-01-21 DIAGNOSIS — N6002 Solitary cyst of left breast: Secondary | ICD-10-CM | POA: Diagnosis not present

## 2021-01-21 DIAGNOSIS — N6321 Unspecified lump in the left breast, upper outer quadrant: Secondary | ICD-10-CM | POA: Diagnosis not present

## 2021-01-23 ENCOUNTER — Ambulatory Visit: Payer: BC Managed Care – PPO | Admitting: Internal Medicine

## 2021-01-31 ENCOUNTER — Other Ambulatory Visit: Payer: BC Managed Care – PPO

## 2021-02-09 ENCOUNTER — Other Ambulatory Visit: Payer: Self-pay | Admitting: Family Medicine

## 2021-02-09 DIAGNOSIS — I10 Essential (primary) hypertension: Secondary | ICD-10-CM

## 2021-02-11 ENCOUNTER — Other Ambulatory Visit: Payer: Self-pay

## 2021-02-11 ENCOUNTER — Ambulatory Visit: Payer: BC Managed Care – PPO | Admitting: Internal Medicine

## 2021-02-11 ENCOUNTER — Encounter: Payer: Self-pay | Admitting: Internal Medicine

## 2021-02-11 VITALS — BP 138/88 | HR 80 | Resp 18 | Ht 64.0 in | Wt 213.8 lb

## 2021-02-11 DIAGNOSIS — I10 Essential (primary) hypertension: Secondary | ICD-10-CM | POA: Diagnosis not present

## 2021-02-11 DIAGNOSIS — R928 Other abnormal and inconclusive findings on diagnostic imaging of breast: Secondary | ICD-10-CM

## 2021-02-11 DIAGNOSIS — K219 Gastro-esophageal reflux disease without esophagitis: Secondary | ICD-10-CM | POA: Diagnosis not present

## 2021-02-11 DIAGNOSIS — M542 Cervicalgia: Secondary | ICD-10-CM

## 2021-02-11 DIAGNOSIS — E042 Nontoxic multinodular goiter: Secondary | ICD-10-CM | POA: Diagnosis not present

## 2021-02-11 DIAGNOSIS — R079 Chest pain, unspecified: Secondary | ICD-10-CM

## 2021-02-11 MED ORDER — FAMOTIDINE 40 MG PO TABS
40.0000 mg | ORAL_TABLET | Freq: Every day | ORAL | 0 refills | Status: DC
Start: 2021-02-11 — End: 2021-05-12

## 2021-02-11 NOTE — Patient Instructions (Signed)
Start taking Pepcid 40 mg once daily. Avoid hot and spicy food.  Please schedule an appointment with Dentist for routine check up.  Continue to take other medications as prescribed.

## 2021-02-11 NOTE — Assessment & Plan Note (Signed)
Well-controlled On Amlodipine 5 mg QD Advised to follow DASH diet and perform moderate exercise as tolerated 

## 2021-02-11 NOTE — Assessment & Plan Note (Signed)
Needs surveillance US breast around 06/2021

## 2021-02-11 NOTE — Assessment & Plan Note (Signed)
On Methimazole 5 mg BID Follows Endocrinology Last TSH and T4 wnl 

## 2021-02-11 NOTE — Progress Notes (Signed)
Established Patient Office Visit  Subjective:  Patient ID: Gina Coleman, female    DOB: 1976-05-03  Age: 45 y.o. MRN: [id number]  CC:  Chief Complaint  Patient presents with  . Follow-up    Follow up still having chest pains and still hurts under neck     HPI Gina Coleman is a 45 year old female with PMH of hypertension and subclinical hyperthyroidism who presents for follow up of her chronic medical conditions.  HTN: BP is well-controlled. Takes medications regularly. Patient denies headache, dizziness, chest pain, dyspnea or palpitations.  Hyperthyroidism: On Methimazole. Continues to have left sided neck pain/fullness. US thyroid showed stable thyroid nodules.  Chest pain: Still continues to have intermittent chest discomfort. Had negative stress test. Denies any dyspnea or palpitations.  Past Medical History:  Diagnosis Date  . Hypertension   . Hyperthyroidism     Past Surgical History:  Procedure Laterality Date  . FOOT SURGERY Right   . LEG SURGERY Right   . TUBAL LIGATION      Family History  Problem Relation Age of Onset  . CAD Mother        Premature disease  . Hypertension Sister   . Breast cancer Maternal Grandmother   . HIV Sister   . Asthma Son     Social History   Socioeconomic History  . Marital status: Married    Spouse name: Not on file  . Number of children: Not on file  . Years of education: Not on file  . Highest education level: Not on file  Occupational History  . Not on file  Tobacco Use  . Smoking status: Former Smoker    Types: Cigarettes    Quit date: 07/06/2019    Years since quitting: 1.6  . Smokeless tobacco: Never Used  Vaping Use  . Vaping Use: Never used  Substance and Sexual Activity  . Alcohol use: Yes    Comment: social   . Drug use: Never  . Sexual activity: Yes    Birth control/protection: Surgical    Comment: tubal  Other Topics Concern  . Not on file  Social History Narrative  . Not on file    Social Determinants of Health   Financial Resource Strain: Low Risk   . Difficulty of Paying Living Expenses: Not hard at all  Food Insecurity: No Food Insecurity  . Worried About Charity fundraiser in the Last Year: Never true  . Ran Out of Food in the Last Year: Never true  Transportation Needs: No Transportation Needs  . Lack of Transportation (Medical): No  . Lack of Transportation (Non-Medical): No  Physical Activity: Insufficiently Active  . Days of Exercise per Week: 3 days  . Minutes of Exercise per Session: 30 min  Stress: Stress Concern Present  . Feeling of Stress : To some extent  Social Connections: Moderately Integrated  . Frequency of Communication with Friends and Family: More than three times a week  . Frequency of Social Gatherings with Friends and Family: More than three times a week  . Attends Religious Services: 1 to 4 times per year  . Active Member of Clubs or Organizations: No  . Attends Archivist Meetings: Never  . Marital Status: Married  Human resources officer Violence: Not At Risk  . Fear of Current or Ex-Partner: No  . Emotionally Abused: No  . Physically Abused: No  . Sexually Abused: No    Outpatient Medications Prior to Visit  Medication Sig Dispense Refill  .  amLODipine (NORVASC) 5 MG tablet Take 1 tablet (5 mg total) by mouth daily. 90 tablet 1  . medroxyPROGESTERone (PROVERA) 2.5 MG tablet Take 1 daily at bedtime 30 tablet 3  . methimazole (TAPAZOLE) 5 MG tablet Take 5 mg by mouth 2 (two) times daily.    . Omega-3 Fatty Acids (FISH OIL PO) Take by mouth.    . famotidine (PEPCID) 20 MG tablet Take 1 tablet (20 mg total) by mouth daily. 30 tablet 0   No facility-administered medications prior to visit.    Allergies  Allergen Reactions  . Latex   . Other     Other reaction(s): Other GLOVE POWDER = RASH GLOVE POWDER = RASH GLOVE POWDER = RASH     ROS Review of Systems  Constitutional: Negative for chills and fever.  HENT:  Negative for congestion, sinus pressure, sinus pain and sore throat.   Eyes: Negative for pain and discharge.  Respiratory: Negative for cough and shortness of breath.   Cardiovascular: Positive for chest pain. Negative for palpitations.  Gastrointestinal: Negative for abdominal pain, constipation, diarrhea, nausea and vomiting.  Endocrine: Negative for polydipsia and polyuria.  Genitourinary: Negative for dysuria and hematuria.  Musculoskeletal: Positive for neck pain. Negative for neck stiffness.  Skin: Negative for rash.  Neurological: Negative for dizziness and weakness.  Psychiatric/Behavioral: Negative for agitation and behavioral problems.      Objective:    Physical Exam Vitals reviewed.  Constitutional:      General: She is not in acute distress.    Appearance: She is obese. She is not diaphoretic.  HENT:     Head: Normocephalic and atraumatic.     Nose: Nose normal. No congestion.     Mouth/Throat:     Mouth: Mucous membranes are moist.     Pharynx: No posterior oropharyngeal erythema.  Eyes:     General: No scleral icterus.    Extraocular Movements: Extraocular movements intact.     Pupils: Pupils are equal, round, and reactive to light.  Neck:     Thyroid: Thyromegaly present. No thyroid tenderness.  Cardiovascular:     Rate and Rhythm: Normal rate and regular rhythm.     Pulses: Normal pulses.     Heart sounds: Normal heart sounds. No murmur heard. No friction rub. No gallop.   Pulmonary:     Breath sounds: Normal breath sounds. No wheezing or rales.  Abdominal:     Palpations: Abdomen is soft.     Tenderness: There is no abdominal tenderness.  Musculoskeletal:     Cervical back: Normal range of motion. Tenderness (Left submandibular area, no apparent LAD) present. No rigidity.     Right lower leg: No edema.     Left lower leg: No edema.  Skin:    General: Skin is warm.     Findings: No rash.  Neurological:     General: No focal deficit present.      Mental Status: She is alert and oriented to person, place, and time.     Sensory: No sensory deficit.     Motor: No weakness.  Psychiatric:        Mood and Affect: Mood normal.        Behavior: Behavior normal.     BP 138/88 (BP Location: Right Arm, Patient Position: Sitting, Cuff Size: Normal)   Pulse 80   Resp 18   Ht 5\' 4"  (1.626 m)   Wt 213 lb 12.8 oz (97 kg)   SpO2 99%   BMI  36.70 kg/m  Wt Readings from Last 3 Encounters:  02/11/21 213 lb 12.8 oz (97 kg)  01/01/21 213 lb 8 oz (96.8 kg)  12/11/20 211 lb 8 oz (95.9 kg)     Health Maintenance Due  Topic Date Due  . Hepatitis C Screening  Never done  . HIV Screening  Never done  . TETANUS/TDAP  Never done  . COLONOSCOPY (Pts 45-43yrs Insurance coverage will need to be confirmed)  Never done  . COVID-19 Vaccine (3 - Moderna risk 4-dose series) 08/05/2020    There are no preventive care reminders to display for this patient.  Lab Results  Component Value Date   TSH 1.397 10/31/2020   Lab Results  Component Value Date   WBC 4.9 10/31/2020   HGB 13.1 10/31/2020   HCT 41.8 10/31/2020   MCV 90.1 10/31/2020   PLT 271 10/31/2020   Lab Results  Component Value Date   NA 134 (L) 10/31/2020   K 3.6 10/31/2020   CO2 26 10/31/2020   GLUCOSE 121 (H) 10/31/2020   BUN 10 10/31/2020   CREATININE 0.71 10/31/2020   BILITOT 0.6 10/31/2020   ALKPHOS 112 10/31/2020   AST 14 (L) 10/31/2020   ALT 15 10/31/2020   PROT 7.8 10/31/2020   ALBUMIN 3.8 10/31/2020   CALCIUM 8.9 10/31/2020   ANIONGAP 7 10/31/2020   GFR 69.56 05/13/2020   Lab Results  Component Value Date   CHOL 198 10/30/2019   Lab Results  Component Value Date   HDL 58 10/30/2019   Lab Results  Component Value Date   LDLCALC 126 (H) 10/30/2019   Lab Results  Component Value Date   TRIG 52 10/30/2019   Lab Results  Component Value Date   CHOLHDL 3.4 10/30/2019   No results found for: HGBA1C    Assessment & Plan:   Problem List Items Addressed  This Visit      Cardiovascular and Mediastinum   Hypertension - Primary    Well-controlled On Amlodipine 5 mg QD Advised to follow DASH diet and perform moderate exercise as tolerated        Digestive   GERD (gastroesophageal reflux disease)    Increased dose of Pepcid to 40 mg QD as patient continues to have chest discomfort with negative cardiac workup      Relevant Medications   famotidine (PEPCID) 40 MG tablet     Endocrine   Multinodular goiter    On Methimazole 5 mg BID Follows Endocrinology Last TSH and T4 wnl        Other   Chest pain    Negative stress test Unlikely to be cardiac etiology Could be pectoralis muscle related vs GERD      Abnormal mammogram    Needs surveillance US breast around 06/2021      Neck pain    She points to submandibular area US neck showed stable left sided submandibular lymph node Advised to get routine dental check up, she has h/o dental cavities If persistent pain, will proceed for CT soft tissue neck and/or ENT evaluation         Meds ordered this encounter  Medications  . famotidine (PEPCID) 40 MG tablet    Sig: Take 1 tablet (40 mg total) by mouth daily.    Dispense:  90 tablet    Refill:  0    Follow-up: Return in about 6 months (around 08/14/2021) for Annual physical.    Lindell Spar, MD

## 2021-02-11 NOTE — Assessment & Plan Note (Signed)
She points to submandibular area US neck showed stable left sided submandibular lymph node Advised to get routine dental check up, she has h/o dental cavities If persistent pain, will proceed for CT soft tissue neck and/or ENT evaluation

## 2021-02-11 NOTE — Assessment & Plan Note (Signed)
Negative stress test Unlikely to be cardiac etiology Could be pectoralis muscle related vs GERD

## 2021-02-11 NOTE — Assessment & Plan Note (Signed)
Increased dose of Pepcid to 40 mg QD as patient continues to have chest discomfort with negative cardiac workup

## 2021-02-12 ENCOUNTER — Ambulatory Visit: Payer: BC Managed Care – PPO

## 2021-02-12 VITALS — Ht 64.0 in | Wt 213.0 lb

## 2021-02-12 DIAGNOSIS — Z135 Encounter for screening for eye and ear disorders: Secondary | ICD-10-CM | POA: Diagnosis not present

## 2021-02-12 DIAGNOSIS — Z0100 Encounter for examination of eyes and vision without abnormal findings: Secondary | ICD-10-CM | POA: Diagnosis not present

## 2021-02-12 DIAGNOSIS — Z1211 Encounter for screening for malignant neoplasm of colon: Secondary | ICD-10-CM | POA: Diagnosis not present

## 2021-02-12 DIAGNOSIS — Z0000 Encounter for general adult medical examination without abnormal findings: Secondary | ICD-10-CM | POA: Diagnosis not present

## 2021-02-12 NOTE — Progress Notes (Signed)
Subjective:   Fawn Desrocher is a 45 y.o. female who presents for Medicare Annual (Subsequent) preventive examination.  Review of Systems       Objective:    There were no vitals filed for this visit. There is no height or weight on file to calculate BMI.  Advanced Directives 10/31/2020 09/08/2020 12/15/2019 12/15/2019 09/27/2019 07/05/2019  Does Patient Have a Medical Advance Directive? No No No No No No  Would patient like information on creating a medical advance directive? - - - - No - Patient declined No - Patient declined    Current Medications (verified) Outpatient Encounter Medications as of 02/12/2021  Medication Sig  . amLODipine (NORVASC) 5 MG tablet Take 1 tablet (5 mg total) by mouth daily.  . famotidine (PEPCID) 40 MG tablet Take 1 tablet (40 mg total) by mouth daily.  . medroxyPROGESTERone (PROVERA) 2.5 MG tablet Take 1 daily at bedtime  . methimazole (TAPAZOLE) 5 MG tablet Take 5 mg by mouth 2 (two) times daily.  . Omega-3 Fatty Acids (FISH OIL PO) Take by mouth.  . [DISCONTINUED] lisinopril (ZESTRIL) 5 MG tablet Take by mouth.   No facility-administered encounter medications on file as of 02/12/2021.    Allergies (verified) Latex and Other   History: Past Medical History:  Diagnosis Date  . Hypertension   . Hyperthyroidism    Past Surgical History:  Procedure Laterality Date  . FOOT SURGERY Right   . LEG SURGERY Right   . TUBAL LIGATION     Family History  Problem Relation Age of Onset  . CAD Mother        Premature disease  . Hypertension Sister   . Breast cancer Maternal Grandmother   . HIV Sister   . Asthma Son    Social History   Socioeconomic History  . Marital status: Married    Spouse name: Not on file  . Number of children: Not on file  . Years of education: Not on file  . Highest education level: Not on file  Occupational History  . Not on file  Tobacco Use  . Smoking status: Former Smoker    Types: Cigarettes    Quit  date: 07/06/2019    Years since quitting: 1.6  . Smokeless tobacco: Never Used  Vaping Use  . Vaping Use: Never used  Substance and Sexual Activity  . Alcohol use: Yes    Comment: social   . Drug use: Never  . Sexual activity: Yes    Birth control/protection: Surgical    Comment: tubal  Other Topics Concern  . Not on file  Social History Narrative  . Not on file   Social Determinants of Health   Financial Resource Strain: Low Risk   . Difficulty of Paying Living Expenses: Not hard at all  Food Insecurity: No Food Insecurity  . Worried About Charity fundraiser in the Last Year: Never true  . Ran Out of Food in the Last Year: Never true  Transportation Needs: No Transportation Needs  . Lack of Transportation (Medical): No  . Lack of Transportation (Non-Medical): No  Physical Activity: Insufficiently Active  . Days of Exercise per Week: 3 days  . Minutes of Exercise per Session: 30 min  Stress: Stress Concern Present  . Feeling of Stress : To some extent  Social Connections: Moderately Integrated  . Frequency of Communication with Friends and Family: More than three times a week  . Frequency of Social Gatherings with Friends and Family: More  than three times a week  . Attends Religious Services: 1 to 4 times per year  . Active Member of Clubs or Organizations: No  . Attends Archivist Meetings: Never  . Marital Status: Married    Tobacco Counseling Counseling given: Not Answered   Clinical Intake:                 Diabetic? no         Activities of Daily Living In your present state of health, do you have any difficulty performing the following activities: 09/12/2020  Hearing? N  Vision? N  Difficulty concentrating or making decisions? N  Walking or climbing stairs? N  Dressing or bathing? N  Doing errands, shopping? N  Some recent data might be hidden    Patient Care Team: Lindell Spar, MD as PCP - General (Internal  Medicine) Satira Sark, MD as PCP - Cardiology (Cardiology)  Indicate any recent Medical Services you may have received from other than [facility] providers in the past year (date may be approximate).     Assessment:   This is a routine wellness examination for [place].  Hearing/Vision screen No exam data present  Dietary issues and exercise activities discussed:    Goals   None    Depression Screen PHQ 2/9 Scores 02/11/2021 12/09/2020 09/13/2020 09/12/2020 06/14/2020 05/17/2020 02/27/2020  PHQ - 2 Score 0 0 0 0 0 0 0  PHQ- 9 Score - - 1 - - - -    Fall Risk Fall Risk  02/11/2021 01/01/2021 12/09/2020 10/17/2020 09/13/2020  Falls in the past year? 0 0 0 0 0  Number falls in past yr: 0 - - - -  Injury with Fall? 0 - - - -  Risk for fall due to : No Fall Risks - - - -  Follow up Falls evaluation completed - - Falls evaluation completed -    FALL RISK PREVENTION PERTAINING TO THE HOME:  Any stairs in or around the home? Yes  If so, are there any without handrails? n/a Home free of loose throw rugs in walkways, pet beds, electrical cords, etc? Yes  Adequate lighting in your home to reduce risk of falls? Yes   ASSISTIVE DEVICES UTILIZED TO PREVENT FALLS:  Life alert? No  Use of a cane, walker or w/c? No  Grab bars in the bathroom? No  Shower chair or bench in shower? No  Elevated toilet seat or a handicapped toilet? No   TIMED UP AND GO:  Was the test performed? No .  Length of time to ambulate n/a     Cognitive Function:        Immunizations Immunization History  Administered Date(s) Administered  . Moderna Sars-Covid-2 Vaccination 06/10/2020, 07/08/2020    TDAP status: Due, Education has been provided regarding the importance of this vaccine. Advised may receive this vaccine at local pharmacy or Health Dept. Aware to provide a copy of the vaccination record if obtained from local pharmacy or Health Dept. Verbalized acceptance and understanding.  Flu Vaccine  status: Up to date  Pneumococcal vaccine status: Declined,  Education has been provided regarding the importance of this vaccine but patient still declined. Advised may receive this vaccine at local pharmacy or Health Dept. Aware to provide a copy of the vaccination record if obtained from local pharmacy or Health Dept. Verbalized acceptance and understanding.   Covid-19 vaccine status: Completed vaccines  Qualifies for Shingles Vaccine? Yes   Zostavax completed No  Shingrix Completed?: No.    Education has been provided regarding the importance of this vaccine. Patient has been advised to call insurance company to determine out of pocket expense if they have not yet received this vaccine. Advised may also receive vaccine at local pharmacy or Health Dept. Verbalized acceptance and understanding.  Screening Tests Health Maintenance  Topic Date Due  . Hepatitis C Screening  Never done  . HIV Screening  Never done  . TETANUS/TDAP  Never done  . COLONOSCOPY (Pts 45-23yrs Insurance coverage will need to be confirmed)  Never done  . COVID-19 Vaccine (3 - Moderna risk 4-dose series) 08/05/2020  . INFLUENZA VACCINE  02/27/2021 (Originally 06/30/2020)  . PAP SMEAR-Modifier  06/15/2023  . HPV VACCINES  Aged Out    Health Maintenance  Health Maintenance Due  Topic Date Due  . Hepatitis C Screening  Never done  . HIV Screening  Never done  . TETANUS/TDAP  Never done  . COLONOSCOPY (Pts 45-41yrs Insurance coverage will need to be confirmed)  Never done  . COVID-19 Vaccine (3 - Moderna risk 4-dose series) 08/05/2020    Colorectal cancer screening: Referral to GI placed 02/12/21. Pt aware the office will call re: appt.  Mammogram status: Completed 01/21/21. Repeat every year   Lung Cancer Screening: (Low Dose CT Chest recommended if Age 79-80 years, 30 pack-year currently smoking OR have quit w/in 15years.) does not qualify.   Lung Cancer Screening Referral: n/a  Additional  Screening:  Hepatitis C Screening: does not qualify; Completed  Vision Screening: Recommended annual ophthalmology exams for early detection of glaucoma and other disorders of the eye. Is the patient up to date with their annual eye exam?  No  Who is the provider or what is the name of the office in which the patient attends annual eye exams? n/a If pt is not established with a provider, would they like to be referred to a provider to establish care? Yes .   Dental Screening: Recommended annual dental exams for proper oral hygiene  Community Resource Referral / Chronic Care Management: CRR required this visit?  No   CCM required this visit?  No      Plan:     I have personally reviewed and noted the following in the patient's chart:   . Medical and social history . Use of alcohol, tobacco or illicit drugs  . Current medications and supplements . Functional ability and status . Nutritional status . Physical activity . Advanced directives . List of other physicians . Hospitalizations, surgeries, and ER visits in previous 12 months . Vitals . Screenings to include cognitive, depression, and falls . Referrals and appointments  In addition, I have reviewed and discussed with patient certain preventive protocols, quality metrics, and best practice recommendations. A written personalized care plan for preventive services as well as general preventive health recommendations were provided to patient.     Laretta Bolster, [place]   4/40/1027   Nurse Notes: AWV conducted by nurse in office by phone. Patient gave consent to telehealth visit via audio. Patient at home at time of this visit. Provider in the office at the time of this visit. Visit took 30 minutes to complete.

## 2021-02-12 NOTE — Patient Instructions (Addendum)
[name] , Thank you for taking time to come for your Medicare Wellness Visit. I appreciate your ongoing commitment to your health goals. Please review the following plan we discussed and let me know if I can assist you in the future.   Screening recommendations/referrals: Colonoscopy: Referral sent today. Mammogram: 12/2020 Bone Density: Age 45 Recommended yearly ophthalmology/optometry visit for glaucoma screening and checkup Recommended yearly dental visit for hygiene and checkup  Vaccinations: Influenza vaccine: Fall 2022 Pneumococcal vaccine: Declined Tdap vaccine: Declined Shingles vaccine: Declined  Advanced directives: No  Conditions/risks identified: None  Next appointment: 08/14/21 @ 1 pm  Preventive Care 40-64 Years, Female Preventive care refers to lifestyle choices and visits with your health care provider that can promote health and wellness. What does preventive care include?  A yearly physical exam. This is also called an annual well check.  Dental exams once or twice a year.  Routine eye exams. Ask your health care provider how often you should have your eyes checked.  Personal lifestyle choices, including:  Daily care of your teeth and gums.  Regular physical activity.  Eating a healthy diet.  Avoiding tobacco and drug use.  Limiting alcohol use.  Practicing safe sex.  Taking low-dose aspirin daily starting at age 33.  Taking vitamin and mineral supplements as recommended by your health care provider. What happens during an annual well check? The services and screenings done by your health care provider during your annual well check will depend on your age, overall health, lifestyle risk factors, and family history of disease. Counseling  Your health care provider may ask you questions about your:  Alcohol use.  Tobacco use.  Drug use.  Emotional well-being.  Home and relationship well-being.  Sexual activity.  Eating  habits.  Work and work Statistician.  Method of birth control.  Menstrual cycle.  Pregnancy history. Screening  You may have the following tests or measurements:  Height, weight, and BMI.  Blood pressure.  Lipid and cholesterol levels. These may be checked every 5 years, or more frequently if you are over 26 years old.  Skin check.  Lung cancer screening. You may have this screening every year starting at age 44 if you have a 30-pack-year history of smoking and currently smoke or have quit within the past 15 years.  Fecal occult blood test (FOBT) of the stool. You may have this test every year starting at age 57.  Flexible sigmoidoscopy or colonoscopy. You may have a sigmoidoscopy every 5 years or a colonoscopy every 10 years starting at age 61.  Hepatitis C blood test.  Hepatitis B blood test.  Sexually transmitted disease (STD) testing.  Diabetes screening. This is done by checking your blood sugar (glucose) after you have not eaten for a while (fasting). You may have this done every 1-3 years.  Mammogram. This may be done every 1-2 years. Talk to your health care provider about when you should start having regular mammograms. This may depend on whether you have a family history of breast cancer.  BRCA-related cancer screening. This may be done if you have a family history of breast, ovarian, tubal, or peritoneal cancers.  Pelvic exam and Pap test. This may be done every 3 years starting at age 33. Starting at age 88, this may be done every 5 years if you have a Pap test in combination with an HPV test.  Bone density scan. This is done to screen for osteoporosis. You may have this scan if you are  at high risk for osteoporosis. Discuss your test results, treatment options, and if necessary, the need for more tests with your health care provider. Vaccines  Your health care provider may recommend certain vaccines, such as:  Influenza vaccine. This is recommended every  year.  Tetanus, diphtheria, and acellular pertussis (Tdap, Td) vaccine. You may need a Td booster every 10 years.  Zoster vaccine. You may need this after age 58.  Pneumococcal 13-valent conjugate (PCV13) vaccine. You may need this if you have certain conditions and were not previously vaccinated.  Pneumococcal polysaccharide (PPSV23) vaccine. You may need one or two doses if you smoke cigarettes or if you have certain conditions. Talk to your health care provider about which screenings and vaccines you need and how often you need them. This information is not intended to replace advice given to you by your health care provider. Make sure you discuss any questions you have with your health care provider. Document Released: 12/13/2015 Document Revised: 08/05/2016 Document Reviewed: 09/17/2015 Elsevier Interactive Patient Education  2017 [place] Prevention in the Home Falls can cause injuries. They can happen to people of all ages. There are many things you can do to make your home safe and to help prevent falls. What can I do on the outside of my home?  Regularly fix the edges of walkways and driveways and fix any cracks.  Remove anything that might make you trip as you walk through a door, such as a raised step or threshold.  Trim any bushes or trees on the path to your home.  Use bright outdoor lighting.  Clear any walking paths of anything that might make someone trip, such as rocks or tools.  Regularly check to see if handrails are loose or broken. Make sure that both sides of any steps have handrails.  Any raised decks and porches should have guardrails on the edges.  Have any leaves, snow, or ice cleared regularly.  Use sand or salt on walking paths during winter.  Clean up any spills in your garage right away. This includes oil or grease spills. What can I do in the bathroom?  Use night lights.  Install grab bars by the toilet and in the tub and shower.  Do not use towel bars as grab bars.  Use non-skid mats or decals in the tub or shower.  If you need to sit down in the shower, use a plastic, non-slip stool.  Keep the floor dry. Clean up any water that spills on the floor as soon as it happens.  Remove soap buildup in the tub or shower regularly.  Attach bath mats securely with double-sided non-slip rug tape.  Do not have throw rugs and other things on the floor that can make you trip. What can I do in the bedroom?  Use night lights.  Make sure that you have a light by your bed that is easy to reach.  Do not use any sheets or blankets that are too big for your bed. They should not hang down onto the floor.  Have a firm chair that has side arms. You can use this for support while you get dressed.  Do not have throw rugs and other things on the floor that can make you trip. What can I do in the kitchen?  Clean up any spills right away.  Avoid walking on wet floors.  Keep items that you use a lot in easy-to-reach places.  If you need to  reach something above you, use a strong step stool that has a grab bar.  Keep electrical cords out of the way.  Do not use floor polish or wax that makes floors slippery. If you must use wax, use non-skid floor wax.  Do not have throw rugs and other things on the floor that can make you trip. What can I do with my stairs?  Do not leave any items on the stairs.  Make sure that there are handrails on both sides of the stairs and use them. Fix handrails that are broken or loose. Make sure that handrails are as long as the stairways.  Check any carpeting to make sure that it is firmly attached to the stairs. Fix any carpet that is loose or worn.  Avoid having throw rugs at the top or bottom of the stairs. If you do have throw rugs, attach them to the floor with carpet tape.  Make sure that you have a light switch at the top of the stairs and the bottom of the stairs. If you do not have them,  ask someone to add them for you. What else can I do to help prevent falls?  Wear shoes that:  Do not have high heels.  Have rubber bottoms.  Are comfortable and fit you well.  Are closed at the toe. Do not wear sandals.  If you use a stepladder:  Make sure that it is fully opened. Do not climb a closed stepladder.  Make sure that both sides of the stepladder are locked into place.  Ask someone to hold it for you, if possible.  Clearly mark and make sure that you can see:  Any grab bars or handrails.  First and last steps.  Where the edge of each step is.  Use tools that help you move around (mobility aids) if they are needed. These include:  Canes.  Walkers.  Scooters.  Crutches.  Turn on the lights when you go into a dark area. Replace any light bulbs as soon as they burn out.  Set up your furniture so you have a clear path. Avoid moving your furniture around.  If any of your floors are uneven, fix them.  If there are any pets around you, be aware of where they are.  Review your medicines with your doctor. Some medicines can make you feel dizzy. This can increase your chance of falling. Ask your doctor what other things that you can do to help prevent falls. This information is not intended to replace advice given to you by your health care provider. Make sure you discuss any questions you have with your health care provider. Document Released: 09/12/2009 Document Revised: 04/23/2016 Document Reviewed: 12/21/2014 Elsevier Interactive Patient Education  2017 [employer].

## 2021-02-13 ENCOUNTER — Encounter (INDEPENDENT_AMBULATORY_CARE_PROVIDER_SITE_OTHER): Payer: Self-pay | Admitting: *Deleted

## 2021-02-26 ENCOUNTER — Ambulatory Visit: Payer: BC Managed Care – PPO | Admitting: Adult Health

## 2021-04-15 ENCOUNTER — Other Ambulatory Visit (INDEPENDENT_AMBULATORY_CARE_PROVIDER_SITE_OTHER): Payer: Self-pay

## 2021-04-15 ENCOUNTER — Other Ambulatory Visit (INDEPENDENT_AMBULATORY_CARE_PROVIDER_SITE_OTHER): Payer: Self-pay | Admitting: *Deleted

## 2021-04-15 DIAGNOSIS — Z1211 Encounter for screening for malignant neoplasm of colon: Secondary | ICD-10-CM

## 2021-04-29 ENCOUNTER — Ambulatory Visit: Payer: BC Managed Care – PPO | Admitting: Adult Health

## 2021-05-01 ENCOUNTER — Other Ambulatory Visit: Payer: Self-pay

## 2021-05-01 ENCOUNTER — Encounter (HOSPITAL_COMMUNITY): Payer: Self-pay | Admitting: Emergency Medicine

## 2021-05-01 ENCOUNTER — Emergency Department (HOSPITAL_COMMUNITY): Payer: BC Managed Care – PPO

## 2021-05-01 ENCOUNTER — Emergency Department (HOSPITAL_COMMUNITY)
Admission: EM | Admit: 2021-05-01 | Discharge: 2021-05-01 | Disposition: A | Discharge status: Home / Self Care | Payer: BC Managed Care – PPO | Attending: Emergency Medicine | Admitting: Emergency Medicine

## 2021-05-01 DIAGNOSIS — I10 Essential (primary) hypertension: Secondary | ICD-10-CM | POA: Diagnosis not present

## 2021-05-01 DIAGNOSIS — Z87891 Personal history of nicotine dependence: Secondary | ICD-10-CM | POA: Insufficient documentation

## 2021-05-01 DIAGNOSIS — R0789 Other chest pain: Secondary | ICD-10-CM | POA: Diagnosis not present

## 2021-05-01 DIAGNOSIS — R079 Chest pain, unspecified: Secondary | ICD-10-CM

## 2021-05-01 LAB — CBC
HCT: 41.7 % (ref 36.0–46.0)
Hemoglobin: 13.1 g/dL (ref 12.0–15.0)
MCH: 28.2 pg (ref 26.0–34.0)
MCHC: 31.4 g/dL (ref 30.0–36.0)
MCV: 89.7 fL (ref 80.0–100.0)
Platelets: 280 10*3/uL (ref 150–400)
RBC: 4.65 MIL/uL (ref 3.87–5.11)
RDW: 13.1 % (ref 11.5–15.5)
WBC: 7.2 10*3/uL (ref 4.0–10.5)
nRBC: 0 % (ref 0.0–0.2)

## 2021-05-01 LAB — COMPREHENSIVE METABOLIC PANEL
ALT: 18 U/L (ref 0–44)
AST: 16 U/L (ref 15–41)
Albumin: 3.9 g/dL (ref 3.5–5.0)
Alkaline Phosphatase: 123 U/L (ref 38–126)
Anion gap: 6 (ref 5–15)
BUN: 13 mg/dL (ref 6–20)
CO2: 26 mmol/L (ref 22–32)
Calcium: 8.9 mg/dL (ref 8.9–10.3)
Chloride: 103 mmol/L (ref 98–111)
Creatinine, Ser: 0.71 mg/dL (ref 0.44–1.00)
GFR, Estimated: >60 mL/min (ref >60)
Glucose, Bld: 103 mg/dL — ABNORMAL HIGH (ref 70–99)
Potassium: 3.9 mmol/L (ref 3.5–5.1)
Sodium: 135 mmol/L (ref 135–145)
Total Bilirubin: 0.4 mg/dL (ref 0.3–1.2)
Total Protein: 8.1 g/dL (ref 6.5–8.1)

## 2021-05-01 LAB — LIPASE, BLOOD: Lipase: 36 U/L (ref 11–51)

## 2021-05-01 LAB — HCG, SERUM, QUALITATIVE: Preg, Serum: NEGATIVE

## 2021-05-01 LAB — TROPONIN I (HIGH SENSITIVITY)
Troponin I (High Sensitivity): <2 ng/L (ref <18)
Troponin I (High Sensitivity): <2 ng/L (ref <18)

## 2021-05-01 MED ORDER — FAMOTIDINE IN NACL 20-0.9 MG/50ML-% IV SOLN
20.0000 mg | Freq: Once | INTRAVENOUS | Status: AC
  Administered 2021-05-01: 20 mg via INTRAVENOUS
  Filled 2021-05-01: qty 50

## 2021-05-01 MED ORDER — ALUM & MAG HYDROXIDE-SIMETH 200-200-20 MG/5ML PO SUSP
30.0000 mL | Freq: Once | ORAL | Status: AC
  Administered 2021-05-01: 30 mL via ORAL
  Filled 2021-05-01: qty 30

## 2021-05-01 MED ORDER — ONDANSETRON HCL 4 MG/2ML IJ SOLN
4.0000 mg | Freq: Once | INTRAMUSCULAR | Status: AC
  Administered 2021-05-01: 4 mg via INTRAVENOUS
  Filled 2021-05-01: qty 2

## 2021-05-01 MED ORDER — OMEPRAZOLE 20 MG PO CPDR: 20.0000 mg | DELAYED_RELEASE_CAPSULE | Freq: Every day | ORAL | 1 refills | Status: DC

## 2021-05-01 MED ORDER — SUCRALFATE 1 G PO TABS: 1.0000 g | ORAL_TABLET | Freq: Four times a day (QID) | ORAL | 0 refills | Status: DC | PRN

## 2021-05-01 NOTE — Discharge Instructions (Addendum)
You were evaluated in the Emergency Department and after careful evaluation, we did not find any emergent condition requiring admission or further testing in the hospital.  Your exam/testing today was overall reassuring.  Symptoms seem to be due to acid reflux.  Please take the omeprazole and Carafate medications as we discussed and follow-up with your primary care doctor.  Please return to the Emergency Department if you experience any worsening of your condition.  Thank you for allowing [place] to be a part of your care.

## 2021-05-01 NOTE — ED Triage Notes (Signed)
Pt c/o left chest pressure that is sharp intermittently since 2200.

## 2021-05-01 NOTE — ED Provider Notes (Signed)
[facility] Emergency Department Provider Note MRN:  [id number]  Arrival date & time: 05/01/21     Chief Complaint   Chest Pain   History of Present Illness   Gina Coleman is a 45 y.o. year-old female with a history of hypertension presenting to the ED with chief complaint of chest pain.  Location: Left chest Duration: 5 hours Onset: Sudden Timing: Intermittent Description: Sharp, pressure Severity: Severe Exacerbating/Alleviating Factors: None Associated Symptoms: Nausea Pertinent Negatives: Denies dizziness or diaphoresis, no vomiting, no shortness of breath, no abdominal pain   Review of Systems  A complete 10 system review of systems was obtained and all systems are negative except as noted in the HPI and PMH.   Patient's Health History    Past Medical History:  Diagnosis Date  . Hypertension   . Hyperthyroidism     Past Surgical History:  Procedure Laterality Date  . FOOT SURGERY Right   . LEG SURGERY Right   . TUBAL LIGATION      Family History  Problem Relation Age of Onset  . CAD Mother        Premature disease  . Hypertension Sister   . Breast cancer Maternal Grandmother   . HIV Sister   . Asthma Son     Social History   Socioeconomic History  . Marital status: Married    Spouse name: Not on file  . Number of children: Not on file  . Years of education: Not on file  . Highest education level: Not on file  Occupational History  . Not on file  Tobacco Use  . Smoking status: Former Smoker    Types: Cigarettes    Quit date: 07/06/2019    Years since quitting: 1.8  . Smokeless tobacco: Never Used  Vaping Use  . Vaping Use: Never used  Substance and Sexual Activity  . Alcohol use: Yes    Comment: social   . Drug use: Never  . Sexual activity: Yes    Birth control/protection: Surgical    Comment: tubal  Other Topics Concern  . Not on file  Social History Narrative  . Not on file   Social Determinants of  Health   Financial Resource Strain: Low Risk   . Difficulty of Paying Living Expenses: Not hard at all  Food Insecurity: No Food Insecurity  . Worried About Charity fundraiser in the Last Year: Never true  . Ran Out of Food in the Last Year: Never true  Transportation Needs: No Transportation Needs  . Lack of Transportation (Medical): No  . Lack of Transportation (Non-Medical): No  Physical Activity: Inactive  . Days of Exercise per Week: 0 days  . Minutes of Exercise per Session: 0 min  Stress: No Stress Concern Present  . Feeling of Stress : Only a little  Social Connections: Moderately Isolated  . Frequency of Communication with Friends and Family: More than three times a week  . Frequency of Social Gatherings with Friends and Family: Once a week  . Attends Religious Services: Never  . Active Member of Clubs or Organizations: No  . Attends Archivist Meetings: Never  . Marital Status: Married  Human resources officer Violence: Not At Risk  . Fear of Current or Ex-Partner: No  . Emotionally Abused: No  . Physically Abused: No  . Sexually Abused: No     Physical Exam   Vitals:   05/01/21 0500 05/01/21 0530  BP: 120/81 124/83  Pulse: 77 73  Resp: 16 17  Temp:    SpO2: 98% 96%    CONSTITUTIONAL: Well-appearing, NAD NEURO:  Alert and oriented x 3, no focal deficits EYES:  eyes equal and reactive ENT/NECK:  no LAD, no JVD CARDIO: Regular rate, well-perfused, normal S1 and S2 PULM:  CTAB no wheezing or rhonchi GI/GU:  normal bowel sounds, non-distended, non-tender MSK/SPINE:  No gross deformities, no edema SKIN:  no rash, atraumatic PSYCH:  Appropriate speech and behavior  *Additional and/or pertinent findings included in MDM below  Diagnostic and Interventional Summary    EKG Interpretation  Date/Time:  Thursday May 01 2021 02:47:29 EDT Ventricular Rate:  89 PR Interval:  205 QRS Duration: 83 QT Interval:  373 QTC Calculation: 454 R Axis:   49 Text  Interpretation: Sinus rhythm Borderline prolonged PR interval Baseline wander in lead(s) I II aVR aVF V1 V2 V3 V4 V5 Confirmed by Gerlene Fee [phone]) on 05/01/2021 3:28:43 AM      Labs Reviewed  COMPREHENSIVE METABOLIC PANEL - Abnormal; Notable for the following components:      Result Value   Glucose, Bld 103 (*)    All other components within normal limits  CBC  LIPASE, BLOOD  HCG, SERUM, QUALITATIVE  TROPONIN I (HIGH SENSITIVITY)  TROPONIN I (HIGH SENSITIVITY)    DG Chest Port 1 View  Final Result      Medications  famotidine (PEPCID) IVPB 20 mg premix (0 mg Intravenous Stopped 05/01/21 0359)  ondansetron (ZOFRAN) injection 4 mg (4 mg Intravenous Given 05/01/21 0326)  alum & mag hydroxide-simeth (MAALOX/MYLANTA) 200-200-20 MG/5ML suspension 30 mL (30 mLs Oral Given 05/01/21 0325)     Procedures  /  Critical Care Procedures  ED Course and Medical Decision Making  I have reviewed the triage vital signs, the nursing notes, and pertinent available records from the EMR.  Listed above are laboratory and imaging tests that I personally ordered, reviewed, and interpreted and then considered in my medical decision making (see below for details).  Patient explains that the last time she had this pain she was diagnosed with GERD.  EKG is overall reassuring, still considering ACS and will follow-up troponin x2.  No evidence of DVT, no hypoxia, no shortness of breath, doubt PE.     Troponin is negative x2.  Feeling a bit better, appropriate for discharge.  Barth Kirks. Sedonia Small, [place] [email or web address]  Final Clinical Impressions(s) / ED Diagnoses     ICD-10-CM   1. Chest pain, unspecified type  R07.9     ED Discharge Orders         Ordered    sucralfate (CARAFATE) 1 g tablet  4 times daily PRN        05/01/21 0550    omeprazole (PRILOSEC) 20 MG capsule  Daily        05/01/21 0550           Discharge Instructions Discussed  with and Provided to Patient:     Discharge Instructions     You were evaluated in the Emergency Department and after careful evaluation, we did not find any emergent condition requiring admission or further testing in the hospital.  Your exam/testing today was overall reassuring.  Symptoms seem to be due to acid reflux.  Please take the omeprazole and Carafate medications as we discussed and follow-up with your primary care doctor.  Please return to the Emergency Department if you experience any worsening of your condition.  Thank you for  allowing [place] to be a part of your care.        Maudie Flakes, MD 05/01/21 [phone]

## 2021-05-02 ENCOUNTER — Encounter (INDEPENDENT_AMBULATORY_CARE_PROVIDER_SITE_OTHER): Payer: Self-pay | Admitting: *Deleted

## 2021-05-02 ENCOUNTER — Telehealth (INDEPENDENT_AMBULATORY_CARE_PROVIDER_SITE_OTHER): Payer: Self-pay | Admitting: *Deleted

## 2021-05-02 MED ORDER — PEG 3350-KCL-NA BICARB-NACL 420 G PO SOLR: 4000.0000 mL | Freq: Once | ORAL | 0 refills | Status: AC

## 2021-05-02 NOTE — Telephone Encounter (Signed)
Done

## 2021-05-02 NOTE — Telephone Encounter (Signed)
Patient needs trilyte 

## 2021-05-07 ENCOUNTER — Telehealth (INDEPENDENT_AMBULATORY_CARE_PROVIDER_SITE_OTHER): Payer: Self-pay | Admitting: *Deleted

## 2021-05-07 ENCOUNTER — Ambulatory Visit: Payer: BC Managed Care – PPO | Admitting: Nurse Practitioner

## 2021-05-07 NOTE — Telephone Encounter (Signed)
Referring MD/PCP: patel  Procedure: tcs  Reason/Indication:  screening  Has patient had this procedure before?  no  If so, when, by whom and where?    Is there a family history of colon cancer?  no  Who?  What age when diagnosed?    Is patient diabetic? If yes, Type 1 or Type 2   no      Does patient have prosthetic heart valve or mechanical valve?  no  Do you have a pacemaker/defibrillator?  no  Has patient ever had endocarditis/atrial fibrillation? no  Does patient use oxygen? no  Has patient had joint replacement within last 12 months?  no  Is patient constipated or do they take laxatives? no  Does patient have a history of alcohol/drug use?  no  Have you had a stroke/heart attack last 6 mths? no  Do you take medicine for weight loss?  no  For female patients,: do you still have your menstrual cycle?   Is patient on blood thinner such as Coumadin, Plavix and/or Aspirin? no  Medications: amlodipine 5 mg daily, provera 2.5 mg nightly, methimazole 5 mg bid, omega 3 daily, famotidine 20 mg daily  Allergies: latex  Medication Adjustment per Dr Rehman/Dr Jenetta Downer   Procedure date & time: 06/05/21

## 2021-05-12 ENCOUNTER — Other Ambulatory Visit: Payer: Self-pay | Admitting: Internal Medicine

## 2021-05-12 DIAGNOSIS — K219 Gastro-esophageal reflux disease without esophagitis: Secondary | ICD-10-CM

## 2021-05-13 ENCOUNTER — Other Ambulatory Visit (INDEPENDENT_AMBULATORY_CARE_PROVIDER_SITE_OTHER): Payer: Self-pay

## 2021-05-13 ENCOUNTER — Encounter: Payer: Self-pay | Admitting: Internal Medicine

## 2021-05-13 ENCOUNTER — Ambulatory Visit: Payer: BC Managed Care – PPO | Admitting: Internal Medicine

## 2021-05-13 ENCOUNTER — Other Ambulatory Visit: Payer: Self-pay

## 2021-05-13 VITALS — BP 134/82 | HR 85 | Temp 98.8°F | Resp 16 | Ht 64.0 in | Wt 216.0 lb

## 2021-05-13 DIAGNOSIS — K219 Gastro-esophageal reflux disease without esophagitis: Secondary | ICD-10-CM | POA: Diagnosis not present

## 2021-05-13 DIAGNOSIS — Z09 Encounter for follow-up examination after completed treatment for conditions other than malignant neoplasm: Secondary | ICD-10-CM | POA: Diagnosis not present

## 2021-05-13 DIAGNOSIS — R0789 Other chest pain: Secondary | ICD-10-CM

## 2021-05-13 MED ORDER — SUCRALFATE 1 G PO TABS: 1.0000 g | ORAL_TABLET | Freq: Four times a day (QID) | ORAL | 0 refills | Status: DC | PRN

## 2021-05-13 MED ORDER — OMEPRAZOLE 40 MG PO CPDR: 40.0000 mg | DELAYED_RELEASE_CAPSULE | Freq: Every day | ORAL | 3 refills | Status: DC

## 2021-05-13 NOTE — Assessment & Plan Note (Signed)
Persistent chest discomfort with negative cardiac workup Recent ER visit - On Omeprazole and Sucralfate Increased dose of Omeprazole to 40 mg QD Will discuss with GI for possible evaluation with EGD

## 2021-05-13 NOTE — Progress Notes (Signed)
Acute Office Visit  Subjective:    Patient ID: Gina Coleman, female    DOB: [birth year], 45 y.o.   MRN: [id number]  Chief Complaint  Patient presents with   Gastroesophageal Reflux    Pt went to ER 05-01-21 at [facility] chest pain told her it was indigestion/acid reflux was given maylox omeprazole and sucralfate she is not better she still feels the burning sensation and and nauseas like something is stuck in her throat     HPI Patient is in today for evaluation after recent ER visit for chest discomfort. She had EKG and negative cardiac markers. Of note, she has had negative cardiac workup for her chest discomfort in the past. She has been taking Omeprazole and Sucralfate and continues to have acid reflux/burning sensation. Denies any dyspnea or palpitations.  Past Medical History:  Diagnosis Date   Hypertension    Hyperthyroidism     Past Surgical History:  Procedure Laterality Date   FOOT SURGERY Right    LEG SURGERY Right    TUBAL LIGATION      Family History  Problem Relation Age of Onset   CAD Mother        Premature disease   Hypertension Sister    Breast cancer Maternal Grandmother    HIV Sister    Asthma Son     Social History   Socioeconomic History   Marital status: Married    Spouse name: Not on file   Number of children: Not on file   Years of education: Not on file   Highest education level: Not on file  Occupational History   Not on file  Tobacco Use   Smoking status: Former    Pack years: 0.00    Types: Cigarettes    Quit date: 07/06/2019    Years since quitting: 1.8   Smokeless tobacco: Never  Vaping Use   Vaping Use: Never used  Substance and Sexual Activity   Alcohol use: Yes    Comment: social    Drug use: Never   Sexual activity: Yes    Birth control/protection: Surgical    Comment: tubal  Other Topics Concern   Not on file  Social History Narrative   Not on file   Social Determinants of Health   Financial Resource Strain:  Low Risk    Difficulty of Paying Living Expenses: Not hard at all  Food Insecurity: No Food Insecurity   Worried About Charity fundraiser in the Last Year: Never true   [place] in the Last Year: Never true  Transportation Needs: No Transportation Needs   Lack of Transportation (Medical): No   Lack of Transportation (Non-Medical): No  Physical Activity: Inactive   Days of Exercise per Week: 0 days   Minutes of Exercise per Session: 0 min  Stress: No Stress Concern Present   Feeling of Stress : Only a little  Social Connections: Moderately Isolated   Frequency of Communication with Friends and Family: More than three times a week   Frequency of Social Gatherings with Friends and Family: Once a week   Attends Religious Services: Never   Marine scientist or Organizations: No   Attends Music therapist: Never   Marital Status: Married  Human resources officer Violence: Not At Risk   Fear of Current or Ex-Partner: No   Emotionally Abused: No   Physically Abused: No   Sexually Abused: No    Outpatient Medications Prior to Visit  Medication  Sig Dispense Refill   amLODipine (NORVASC) 5 MG tablet Take 1 tablet (5 mg total) by mouth daily. 90 tablet 1   famotidine (PEPCID) 40 MG tablet TAKE 1 TABLET(40 MG) BY MOUTH DAILY 90 tablet 0   medroxyPROGESTERone (PROVERA) 2.5 MG tablet Take 1 daily at bedtime 30 tablet 3   methimazole (TAPAZOLE) 5 MG tablet Take 5 mg by mouth 2 (two) times daily.     Omega-3 Fatty Acids (FISH OIL PO) Take by mouth.     omeprazole (PRILOSEC) 20 MG capsule Take 1 capsule (20 mg total) by mouth daily. 30 capsule 1   sucralfate (CARAFATE) 1 g tablet Take 1 tablet (1 g total) by mouth 4 (four) times daily as needed. 30 tablet 0   No facility-administered medications prior to visit.    Allergies  Allergen Reactions   Latex    Other     Other reaction(s): Other GLOVE POWDER = RASH GLOVE POWDER = RASH GLOVE POWDER = RASH     Review of  Systems  Constitutional:  Negative for chills and fever.  HENT:  Negative for congestion, sinus pressure, sinus pain and sore throat.   Eyes:  Negative for pain and discharge.  Respiratory:  Negative for cough and shortness of breath.   Cardiovascular:  Positive for chest pain. Negative for palpitations.  Gastrointestinal:  Positive for abdominal pain (epigastric). Negative for constipation, diarrhea, nausea and vomiting.  Endocrine: Negative for polydipsia and polyuria.  Genitourinary:  Negative for dysuria and hematuria.  Musculoskeletal:  Positive for neck pain. Negative for neck stiffness.  Skin:  Negative for rash.  Neurological:  Negative for dizziness and weakness.  Psychiatric/Behavioral:  Negative for agitation and behavioral problems.       Objective:    Physical Exam Vitals reviewed.  Constitutional:      General: She is not in acute distress.    Appearance: She is obese. She is not diaphoretic.  HENT:     Head: Normocephalic and atraumatic.     Nose: Nose normal. No congestion.     Mouth/Throat:     Mouth: Mucous membranes are moist.     Pharynx: No posterior oropharyngeal erythema.  Eyes:     General: No scleral icterus.    Extraocular Movements: Extraocular movements intact.     Pupils: Pupils are equal, round, and reactive to light.  Neck:     Thyroid: Thyromegaly present. No thyroid tenderness.  Cardiovascular:     Rate and Rhythm: Normal rate and regular rhythm.     Pulses: Normal pulses.     Heart sounds: Normal heart sounds. No murmur heard.   No friction rub. No gallop.  Pulmonary:     Breath sounds: Normal breath sounds. No wheezing or rales.  Abdominal:     Palpations: Abdomen is soft.     Tenderness: There is no abdominal tenderness.  Musculoskeletal:     Cervical back: Normal range of motion. No rigidity.     Right lower leg: No edema.     Left lower leg: No edema.  Skin:    General: Skin is warm.     Findings: No rash.  Neurological:      General: No focal deficit present.     Mental Status: She is alert and oriented to person, place, and time.     Sensory: No sensory deficit.     Motor: No weakness.  Psychiatric:        Mood and Affect: Mood normal.  Behavior: Behavior normal.    BP 134/82 (BP Location: Left Arm, Patient Position: Sitting, Cuff Size: Normal)   Pulse 85   Temp 98.8 F (37.1 C) (Oral)   Resp 16   Ht 5\' 4"  (1.626 m)   Wt 216 lb (98 kg)   LMP 04/21/2021   SpO2 98%   BMI 37.08 kg/m  Wt Readings from Last 3 Encounters:  05/13/21 216 lb (98 kg)  05/01/21 213 lb (96.6 kg)  02/12/21 213 lb (96.6 kg)    Health Maintenance Due  Topic Date Due   Pneumococcal Vaccine 25-69 Years old (1 - PCV) Never done   HIV Screening  Never done   Hepatitis C Screening  Never done   TETANUS/TDAP  Never done   Zoster Vaccines- Shingrix (1 of 2) Never done   COLONOSCOPY (Pts 45-65yrs Insurance coverage will need to be confirmed)  Never done   COVID-19 Vaccine (3 - Moderna risk series) 08/05/2020    There are no preventive care reminders to display for this patient.   Lab Results  Component Value Date   TSH 1.397 10/31/2020   Lab Results  Component Value Date   WBC 7.2 05/01/2021   HGB 13.1 05/01/2021   HCT 41.7 05/01/2021   MCV 89.7 05/01/2021   PLT 280 05/01/2021   Lab Results  Component Value Date   NA 135 05/01/2021   K 3.9 05/01/2021   CO2 26 05/01/2021   GLUCOSE 103 (H) 05/01/2021   BUN 13 05/01/2021   CREATININE 0.71 05/01/2021   BILITOT 0.4 05/01/2021   ALKPHOS 123 05/01/2021   AST 16 05/01/2021   ALT 18 05/01/2021   PROT 8.1 05/01/2021   ALBUMIN 3.9 05/01/2021   CALCIUM 8.9 05/01/2021   ANIONGAP 6 05/01/2021   GFR 69.56 05/13/2020   Lab Results  Component Value Date   CHOL 198 10/30/2019   Lab Results  Component Value Date   HDL 58 10/30/2019   Lab Results  Component Value Date   LDLCALC 126 (H) 10/30/2019   Lab Results  Component Value Date   TRIG 52 10/30/2019    Lab Results  Component Value Date   CHOLHDL 3.4 10/30/2019   No results found for: HGBA1C     Assessment & Plan:   Problem List Items Addressed This Visit       Digestive   GERD (gastroesophageal reflux disease) - Primary    Persistent chest discomfort with negative cardiac workup Recent ER visit - On Omeprazole and Sucralfate Increased dose of Omeprazole to 40 mg QD Will discuss with GI for possible evaluation with EGD       Relevant Medications   omeprazole (PRILOSEC) 40 MG capsule   sucralfate (CARAFATE) 1 g tablet   Other Visit Diagnoses     Encounter for examination following treatment at hospital   ER chart reviewed including EKG and blood tests Chest discomfort thought to be related to GERD On PPI and Sucralfate          Meds ordered this encounter  Medications   omeprazole (PRILOSEC) 40 MG capsule    Sig: Take 1 capsule (40 mg total) by mouth daily.    Dispense:  30 capsule    Refill:  3   sucralfate (CARAFATE) 1 g tablet    Sig: Take 1 tablet (1 g total) by mouth 4 (four) times daily as needed.    Dispense:  30 tablet    Refill:  0     Jhoana Upham Keith Rake,  MD

## 2021-05-13 NOTE — Patient Instructions (Signed)
Please start taking Omeprazole 40 mg instead of 20 mg. Okay to take Pepcid on as needed basis in between for acid reflux.  Please continue to avoid hot and spicy food.  We will discuss with GI for possible need for endoscopy.

## 2021-05-20 ENCOUNTER — Other Ambulatory Visit: Payer: Self-pay | Admitting: Internal Medicine

## 2021-05-20 DIAGNOSIS — K219 Gastro-esophageal reflux disease without esophagitis: Secondary | ICD-10-CM

## 2021-05-21 ENCOUNTER — Other Ambulatory Visit (HOSPITAL_COMMUNITY): Payer: Self-pay | Admitting: Adult Health

## 2021-05-21 DIAGNOSIS — R928 Other abnormal and inconclusive findings on diagnostic imaging of breast: Secondary | ICD-10-CM

## 2021-05-21 DIAGNOSIS — N63 Unspecified lump in unspecified breast: Secondary | ICD-10-CM

## 2021-05-22 DIAGNOSIS — M5416 Radiculopathy, lumbar region: Secondary | ICD-10-CM | POA: Diagnosis not present

## 2021-05-24 ENCOUNTER — Other Ambulatory Visit: Payer: Self-pay | Admitting: Internal Medicine

## 2021-05-24 DIAGNOSIS — K219 Gastro-esophageal reflux disease without esophagitis: Secondary | ICD-10-CM

## 2021-05-26 ENCOUNTER — Other Ambulatory Visit: Payer: BC Managed Care – PPO | Admitting: Adult Health

## 2021-05-28 DIAGNOSIS — M5416 Radiculopathy, lumbar region: Secondary | ICD-10-CM | POA: Diagnosis not present

## 2021-05-30 ENCOUNTER — Other Ambulatory Visit: Payer: Self-pay | Admitting: Internal Medicine

## 2021-05-30 DIAGNOSIS — K219 Gastro-esophageal reflux disease without esophagitis: Secondary | ICD-10-CM

## 2021-06-05 ENCOUNTER — Encounter (HOSPITAL_COMMUNITY): Payer: Self-pay | Admitting: Internal Medicine

## 2021-06-05 ENCOUNTER — Ambulatory Visit (HOSPITAL_COMMUNITY)
Admission: RE | Admit: 2021-06-05 | Discharge: 2021-06-05 | Disposition: A | Discharge status: Home / Self Care | Payer: BC Managed Care – PPO | Attending: Internal Medicine | Admitting: Internal Medicine

## 2021-06-05 ENCOUNTER — Encounter (HOSPITAL_COMMUNITY)
Admission: RE | Disposition: A | Discharge status: Home / Self Care | Payer: Self-pay | Source: Home / Self Care | Attending: Internal Medicine

## 2021-06-05 ENCOUNTER — Other Ambulatory Visit: Payer: Self-pay

## 2021-06-05 DIAGNOSIS — D120 Benign neoplasm of cecum: Secondary | ICD-10-CM | POA: Insufficient documentation

## 2021-06-05 DIAGNOSIS — K2970 Gastritis, unspecified, without bleeding: Secondary | ICD-10-CM | POA: Diagnosis not present

## 2021-06-05 DIAGNOSIS — Z803 Family history of malignant neoplasm of breast: Secondary | ICD-10-CM | POA: Insufficient documentation

## 2021-06-05 DIAGNOSIS — Z1211 Encounter for screening for malignant neoplasm of colon: Secondary | ICD-10-CM | POA: Diagnosis not present

## 2021-06-05 DIAGNOSIS — Z79899 Other long term (current) drug therapy: Secondary | ICD-10-CM | POA: Insufficient documentation

## 2021-06-05 DIAGNOSIS — K644 Residual hemorrhoidal skin tags: Secondary | ICD-10-CM | POA: Diagnosis not present

## 2021-06-05 DIAGNOSIS — K219 Gastro-esophageal reflux disease without esophagitis: Secondary | ICD-10-CM | POA: Insufficient documentation

## 2021-06-05 DIAGNOSIS — K3189 Other diseases of stomach and duodenum: Secondary | ICD-10-CM | POA: Diagnosis not present

## 2021-06-05 DIAGNOSIS — Z8249 Family history of ischemic heart disease and other diseases of the circulatory system: Secondary | ICD-10-CM | POA: Insufficient documentation

## 2021-06-05 DIAGNOSIS — E0590 Thyrotoxicosis, unspecified without thyrotoxic crisis or storm: Secondary | ICD-10-CM | POA: Insufficient documentation

## 2021-06-05 DIAGNOSIS — R0789 Other chest pain: Secondary | ICD-10-CM | POA: Diagnosis not present

## 2021-06-05 DIAGNOSIS — Z793 Long term (current) use of hormonal contraceptives: Secondary | ICD-10-CM | POA: Diagnosis not present

## 2021-06-05 DIAGNOSIS — K2289 Other specified disease of esophagus: Secondary | ICD-10-CM | POA: Diagnosis not present

## 2021-06-05 DIAGNOSIS — I10 Essential (primary) hypertension: Secondary | ICD-10-CM | POA: Diagnosis not present

## 2021-06-05 DIAGNOSIS — K319 Disease of stomach and duodenum, unspecified: Secondary | ICD-10-CM | POA: Diagnosis not present

## 2021-06-05 DIAGNOSIS — Z87891 Personal history of nicotine dependence: Secondary | ICD-10-CM | POA: Insufficient documentation

## 2021-06-05 HISTORY — PX: ESOPHAGOGASTRODUODENOSCOPY: SHX5428

## 2021-06-05 HISTORY — PX: BIOPSY: SHX5522

## 2021-06-05 HISTORY — PX: COLONOSCOPY: SHX5424

## 2021-06-05 LAB — HM COLONOSCOPY

## 2021-06-05 SURGERY — COLONOSCOPY
Anesthesia: Moderate Sedation

## 2021-06-05 MED ORDER — STERILE WATER FOR IRRIGATION IR SOLN
Status: DC | PRN
  Administered 2021-06-05: 1.5 mL

## 2021-06-05 MED ORDER — MEPERIDINE HCL 50 MG/ML IJ SOLN
INTRAMUSCULAR | Status: AC
  Filled 2021-06-05: qty 1

## 2021-06-05 MED ORDER — MIDAZOLAM HCL 5 MG/5ML IJ SOLN
INTRAMUSCULAR | Status: AC
  Filled 2021-06-05: qty 10

## 2021-06-05 MED ORDER — MEPERIDINE HCL 50 MG/ML IJ SOLN
INTRAMUSCULAR | Status: DC | PRN
  Administered 2021-06-05 (×4): 25 mg via INTRAVENOUS

## 2021-06-05 MED ORDER — MIDAZOLAM HCL 5 MG/5ML IJ SOLN
INTRAMUSCULAR | Status: AC
  Filled 2021-06-05: qty 5

## 2021-06-05 MED ORDER — LIDOCAINE VISCOUS HCL 2 % MT SOLN
OROMUCOSAL | Status: AC
  Filled 2021-06-05: qty 15

## 2021-06-05 MED ORDER — MIDAZOLAM HCL 5 MG/5ML IJ SOLN
INTRAMUSCULAR | Status: DC | PRN
  Administered 2021-06-05: 2 mg via INTRAVENOUS
  Administered 2021-06-05: 3 mg via INTRAVENOUS
  Administered 2021-06-05: 2 mg via INTRAVENOUS
  Administered 2021-06-05: 1 mg via INTRAVENOUS
  Administered 2021-06-05: 2 mg via INTRAVENOUS

## 2021-06-05 MED ORDER — SODIUM CHLORIDE 0.9 % IV SOLN: INTRAVENOUS | Status: DC

## 2021-06-05 MED ORDER — FAMOTIDINE 40 MG PO TABS: 40.0000 mg | ORAL_TABLET | Freq: Every evening | ORAL | 0 refills | Status: DC | PRN

## 2021-06-05 MED ORDER — SODIUM CHLORIDE 0.9 % IV SOLN
INTRAVENOUS | Status: DC
Start: 2021-06-05 — End: 2021-06-05

## 2021-06-05 MED ORDER — LIDOCAINE VISCOUS HCL 2 % MT SOLN
OROMUCOSAL | Status: DC | PRN
  Administered 2021-06-05: 1 via OROMUCOSAL

## 2021-06-05 NOTE — H&P (Signed)
Gina Coleman is an 45 y.o. female.   Chief Complaint: Patient is here for esophagogastroduodenoscopy and colonoscopy. HPI: Patient is 45 year old African-American female who has been experiencing intermittent chest pain for the last 4 months.  She describes as a pressure.  She was seen in emergency room about a month ago and work-up was negative and she was felt to have chest pain due to reflux.  She states she has changed her eating habits and was begun on omeprazole and famotidine.  She has not had any chest pain in the last few weeks.  She denies dysphagia nausea vomiting or melena.  She is also undergoing screening colonoscopy.  No history of change in bowel habits or rectal bleeding. She does not take aspirin or anticoagulants. Family history is negative for CRC.  Past Medical History:  Diagnosis Date   Hypertension    Hyperthyroidism     Past Surgical History:  Procedure Laterality Date   FOOT SURGERY Right    LEG SURGERY Right    TUBAL LIGATION      Family History  Problem Relation Age of Onset   CAD Mother        Premature disease   Hypertension Sister    Breast cancer Maternal Grandmother    HIV Sister    Asthma Son    Social History:  reports that she quit smoking about 23 months ago. Her smoking use included cigarettes. She has never used smokeless tobacco. She reports current alcohol use of about 3.0 standard drinks of alcohol per week. She reports that she does not use drugs.  Allergies:  Allergies  Allergen Reactions   Latex Hives   Other Rash     GLOVE POWDER      Medications Prior to Admission  Medication Sig Dispense Refill   amLODipine (NORVASC) 5 MG tablet Take 1 tablet (5 mg total) by mouth daily. 90 tablet 1   BLACK CURRANT SEED OIL PO Take 5 mLs by mouth daily.     famotidine (PEPCID) 40 MG tablet TAKE 1 TABLET(40 MG) BY MOUTH DAILY (Patient taking differently: Take 40 mg by mouth daily.) 90 tablet 0   methimazole (TAPAZOLE) 5 MG tablet Take 5  mg by mouth 2 (two) times daily.     Multiple Vitamins-Minerals (MULTIVITAMIN WITH MINERALS) tablet Take 1 tablet by mouth daily. One a day woman     Omega-3 Fatty Acids (FISH OIL) 1200 MG CAPS Take 2,400 mg by mouth daily.     omeprazole (PRILOSEC) 40 MG capsule Take 1 capsule (40 mg total) by mouth daily. 30 capsule 3   sucralfate (CARAFATE) 1 g tablet TAKE 1 TABLET(1 GRAM) BY MOUTH FOUR TIMES DAILY AS NEEDED 30 tablet 0   triamcinolone cream (KENALOG) 0.1 % Apply 1 application topically daily as needed (Eczema).     medroxyPROGESTERone (PROVERA) 2.5 MG tablet Take 1 daily at bedtime (Patient not taking: Reported on 05/28/2021) 30 tablet 3    No results found for this or any previous visit (from the past 48 hour(s)). No results found.  Review of Systems  Blood pressure 125/75, temperature 99 F (37.2 C), temperature source Oral, resp. rate 17, height 5\' 4"  (1.626 m), weight 96.6 kg, SpO2 99 %. Physical Exam HENT:     Mouth/Throat:     Mouth: Mucous membranes are moist.     Pharynx: Oropharynx is clear.  Eyes:     General: No scleral icterus.    Conjunctiva/sclera: Conjunctivae normal.  Cardiovascular:     Rate  and Rhythm: Normal rate and regular rhythm.     Heart sounds: Normal heart sounds. No murmur heard. Pulmonary:     Effort: Pulmonary effort is normal.     Breath sounds: Normal breath sounds.  Abdominal:     General: There is no distension.     Palpations: Abdomen is soft. There is no mass.     Tenderness: There is no abdominal tenderness.  Musculoskeletal:        General: No swelling.     Cervical back: Neck supple.  Lymphadenopathy:     Cervical: No cervical adenopathy.  Skin:    General: Skin is warm and dry.     Comments: She has a tattoo over left pectoral region and left forearm.  Neurological:     Mental Status: She is alert.     Assessment/Plan  Atypical chest pain felt to be due to GERD. Diagnostic esophagogastroduodenoscopy and average risk screening  colonoscopy.  Hildred Laser, MD 06/05/2021, 8:29 AM

## 2021-06-05 NOTE — Op Note (Signed)
[facility] Patient Name: Gina Coleman Procedure Date: 06/05/2021 8:10 AM MRN: [id number] Date of Birth: May 08, 1976 Attending MD: Hildred Laser , MD CSN: [id number] Age: 45 Admit Type: Outpatient Procedure:                Upper GI endoscopy Indications:              Chest pain (non cardiac) felt to be due to                            gastroesophageal reflux disease. Providers:                Hildred Laser, MD, Charlsie Quest Theda Sers RN, RN,                            Raphael Gibney, Technician Referring MD:             Ihor Dow, MD Medicines:                Lidocaine spray, Meperidine 75 mg IV, Midazolam 8                            mg IV Complications:            No immediate complications. Estimated Blood Loss:     Estimated blood loss was minimal. Procedure:                Pre-Anesthesia Assessment:                           - Prior to the procedure, a History and Physical                            was performed, and patient medications and                            allergies were reviewed. The patient's tolerance of                            previous anesthesia was also reviewed. The risks                            and benefits of the procedure and the sedation                            options and risks were discussed with the patient.                            All questions were answered, and informed consent                            was obtained. Prior Anticoagulants: The patient has                            taken no previous anticoagulant or antiplatelet  agents. ASA Grade Assessment: II - A patient with                            mild systemic disease. After reviewing the risks                            and benefits, the patient was deemed in                            satisfactory condition to undergo the procedure.                           After obtaining informed consent, the endoscope was                            passed  under direct vision. Throughout the                            procedure, the patient's blood pressure, pulse, and                            oxygen saturations were monitored continuously. The                            [id number]-H190 ([id number]) scope was introduced through the                            mouth, and advanced to the second part of duodenum.                            The upper GI endoscopy was accomplished without                            difficulty. The patient tolerated the procedure                            well. Scope In: 8:44:03 AM Scope Out: 8:51:54 AM Total Procedure Duration: 0 hours 7 minutes 51 seconds  Findings:      The hypopharynx was normal.      The examined esophagus was normal.      The Z-line was irregular and was found 36 cm from the incisors.      Localized mild inflammation characterized by congestion (edema) and       erythema was found in the prepyloric region of the stomach. Biopsies       were taken with a cold forceps for histology. The pathology specimen was       placed into Bottle Number 1.      The exam of the stomach was otherwise normal.      The duodenal bulb and second portion of the duodenum were normal. Impression:               - Normal hypopharynx.                           - Normal esophagus.                           -  Z-line irregular, 36 cm from the incisors.                           - Gastritis. Biopsied.                           - Normal duodenal bulb and second portion of the                            duodenum. Moderate Sedation:      Moderate (conscious) sedation was administered by the endoscopy nurse       and supervised by the endoscopist. The following parameters were       monitored: oxygen saturation, heart rate, blood pressure, CO2       capnography and response to care. Total physician intraservice time was       13 minutes. Recommendation:           - Patient has a contact number available for                             emergencies. The signs and symptoms of potential                            delayed complications were discussed with the                            patient. Return to normal activities tomorrow.                            Written discharge instructions were provided to the                            patient.                           - Resume previous diet today.                           - Discontinue sucralfate.                           - Use famotidine on as-needed basis at bedtime but                            continue other medications as before.                           - Await pathology results. Procedure Code(s):        --- Professional ---                           [phone], Esophagogastroduodenoscopy, flexible,                            transoral; with biopsy, single or multiple  G0500, Moderate sedation services provided by the                            same physician or other qualified health care                            professional performing a gastrointestinal                            endoscopic service that sedation supports,                            requiring the presence of an independent trained                            observer to assist in the monitoring of the                            patient's level of consciousness and physiological                            status; initial 15 minutes of intra-service time;                            patient age 88 years or older (additional time may                            be reported with [phone], as appropriate) Diagnosis Code(s):        --- Professional ---                           K22.8, Other specified diseases of esophagus                           K29.70, Gastritis, unspecified, without bleeding                           R07.89, Other chest pain CPT copyright 2019 American Medical Association. All rights reserved. The codes documented in this report are preliminary and upon  coder review may  be revised to meet current compliance requirements. Hildred Laser, MD Hildred Laser, MD 06/05/2021 9:22:39 AM This report has been signed electronically. Number of Addenda: 0

## 2021-06-05 NOTE — Op Note (Signed)
[facility] Patient Name: Gina Coleman Procedure Date: 06/05/2021 8:54 AM MRN: [id number] Date of Birth: 11/27/1976 Attending MD: Hildred Laser , MD CSN: [id number] Age: 45 Admit Type: Outpatient Procedure:                Colonoscopy Indications:              Screening for colorectal malignant neoplasm Providers:                Hildred Laser, MD, Charlsie Quest. Theda Sers RN, RN,                            Raphael Gibney, Technician Referring MD:             Ihor Dow, MD Medicines:                Meperidine 25 mg IV, Midazolam 2 mg IV Complications:            No immediate complications. Estimated Blood Loss:     Estimated blood loss was minimal. Procedure:                Pre-Anesthesia Assessment:                           - Prior to the procedure, a History and Physical                            was performed, and patient medications and                            allergies were reviewed. The patient's tolerance of                            previous anesthesia was also reviewed. The risks                            and benefits of the procedure and the sedation                            options and risks were discussed with the patient.                            All questions were answered, and informed consent                            was obtained. Prior Anticoagulants: The patient has                            taken no previous anticoagulant or antiplatelet                            agents. ASA Grade Assessment: II - A patient with                            mild systemic disease. After reviewing the risks  and benefits, the patient was deemed in                            satisfactory condition to undergo the procedure.                           After obtaining informed consent, the colonoscope                            was passed under direct vision. Throughout the                            procedure, the patient's blood pressure, pulse,  and                            oxygen saturations were monitored continuously. The                            [id number]-[id number] ([id number]) scope was introduced through                            the anus and advanced to the the terminal ileum,                            with identification of the appendiceal orifice and                            IC valve. The colonoscopy was performed without                            difficulty. The patient tolerated the procedure                            well. The quality of the bowel preparation was                            excellent. The terminal ileum, ileocecal valve,                            appendiceal orifice, and rectum were photographed. Scope In: 8:56:23 AM Scope Out: 9:12:45 AM Scope Withdrawal Time: 0 hours 11 minutes 34 seconds  Total Procedure Duration: 0 hours 16 minutes 22 seconds  Findings:      The perianal and digital rectal examinations were normal.      The terminal ileum appeared normal.      A 4 mm polyp was found in the cecum. Biopsies were taken with a cold       forceps for histology. The pathology specimen was placed into Bottle       Number 2.      The exam was otherwise normal throughout the examined colon.      External hemorrhoids were found during retroflexion. The hemorrhoids       were small. Impression:               - The examined portion of the ileum was normal.                           -  One 4 mm polyp in the cecum. Biopsied.                           - External hemorrhoids. Moderate Sedation:      Moderate (conscious) sedation was administered by the endoscopy nurse       and supervised by the endoscopist. The following parameters were       monitored: oxygen saturation, heart rate, blood pressure, CO2       capnography and response to care. Total physician intraservice time was       21 minutes. Recommendation:           - Patient has a contact number available for                            emergencies. The  signs and symptoms of potential                            delayed complications were discussed with the                            patient. Return to normal activities tomorrow.                            Written discharge instructions were provided to the                            patient.                           - Resume previous diet today.                           - Continue present medications.                           - No aspirin, ibuprofen, naproxen, or other                            non-steroidal anti-inflammatory drugs for 1 day.                           - Await pathology results.                           - Repeat colonoscopy is recommended. The                            colonoscopy date will be determined after pathology                            results from today's exam become available for                            review. Procedure Code(s):        --- Professional ---  45380, Colonoscopy, flexible; with biopsy, single                            or multiple                           G0500, Moderate sedation services provided by the                            same physician or other qualified health care                            professional performing a gastrointestinal                            endoscopic service that sedation supports,                            requiring the presence of an independent trained                            observer to assist in the monitoring of the                            patient's level of consciousness and physiological                            status; initial 15 minutes of intra-service time;                            patient age 42 years or older (additional time may                            be reported with [phone], as appropriate) Diagnosis Code(s):        --- Professional ---                           Z12.11, Encounter for screening for malignant                            neoplasm of  colon                           K64.4, Residual hemorrhoidal skin tags                           K63.5, Polyp of colon CPT copyright 2019 American Medical Association. All rights reserved. The codes documented in this report are preliminary and upon coder review may  be revised to meet current compliance requirements. Hildred Laser, MD Hildred Laser, MD 06/05/2021 9:27:23 AM This report has been signed electronically. Number of Addenda: 0

## 2021-06-05 NOTE — Discharge Instructions (Signed)
Discontinue sucralfate. Take famotidine after supper or at bedtime on as-needed basis but continue omeprazole as before. Resume other medications as before. Resume usual diet. Continue antireflux measures. No driving for 24 hours. Physician will call with biopsy results.

## 2021-06-06 LAB — SURGICAL PATHOLOGY

## 2021-06-09 ENCOUNTER — Ambulatory Visit: Payer: BC Managed Care – PPO | Admitting: Adult Health

## 2021-06-10 ENCOUNTER — Ambulatory Visit (HOSPITAL_COMMUNITY)
Admission: RE | Admit: 2021-06-10 | Discharge: 2021-06-10 | Disposition: A | Discharge status: Home / Self Care | Payer: BC Managed Care – PPO | Source: Ambulatory Visit | Attending: Adult Health | Admitting: Adult Health

## 2021-06-10 ENCOUNTER — Other Ambulatory Visit: Payer: Self-pay

## 2021-06-10 ENCOUNTER — Other Ambulatory Visit (INDEPENDENT_AMBULATORY_CARE_PROVIDER_SITE_OTHER): Payer: Self-pay | Admitting: Internal Medicine

## 2021-06-10 DIAGNOSIS — N63 Unspecified lump in unspecified breast: Secondary | ICD-10-CM | POA: Diagnosis not present

## 2021-06-10 DIAGNOSIS — R928 Other abnormal and inconclusive findings on diagnostic imaging of breast: Secondary | ICD-10-CM | POA: Diagnosis not present

## 2021-06-10 DIAGNOSIS — R922 Inconclusive mammogram: Secondary | ICD-10-CM | POA: Diagnosis not present

## 2021-06-10 MED ORDER — NYSTATIN-TRIAMCINOLONE 100000-0.1 UNIT/GM-% EX CREA: 1.0000 "application " | TOPICAL_CREAM | Freq: Two times a day (BID) | CUTANEOUS | 0 refills | Status: DC

## 2021-06-11 ENCOUNTER — Encounter (INDEPENDENT_AMBULATORY_CARE_PROVIDER_SITE_OTHER): Payer: Self-pay | Admitting: *Deleted

## 2021-06-12 ENCOUNTER — Encounter (HOSPITAL_COMMUNITY): Payer: Self-pay | Admitting: Internal Medicine

## 2021-06-19 ENCOUNTER — Ambulatory Visit: Payer: BC Managed Care – PPO | Admitting: Adult Health

## 2021-06-19 ENCOUNTER — Ambulatory Visit: Payer: BC Managed Care – PPO | Admitting: Internal Medicine

## 2021-06-19 NOTE — Progress Notes (Deleted)
Name: Gina Coleman  MRN/ DOB: [id number], 1975/12/15    Age/ Sex: 45 y.o., female     PCP: Lindell Spar, MD   Reason for Endocrinology Evaluation: [place]     Initial Endocrinology Clinic Visit: 07/12/2019    PATIENT IDENTIFIER: Gina Coleman is a 45 y.o., female with a past medical history of HTN. She has followed with [facility] since 07/12/2019 for consultative assistance with management of her 07/12/2019.   HISTORICAL SUMMARY: The patient was first  noted to have a low TSH at 0.399 uIU/mL during an evaluation for an enlarged cervical lymph node in 05/2019. She was started on Abx at the time.  Repeat TFT's were normal in 07/2019 . Ultrasound showed MNG . She is S/P benign FNA of the RUP nodule 08/2019   TRAb was undetectable  Methimazole started 03/2020 due to symptoms of hyperthyroidism    No FH of thyroid disorder  SUBJECTIVE:    Today (06/19/2021):  Gina Coleman is here for a follow up on subclinical hyperthyroidism. S/P FNA of the RUP nodule with benign cytology in 08/2019  She feels that she has a frog in her throat, no congestion, but has heartburn. She is on Pepcid which helps.   Denies fever but has occasional abdominal pain, has a fibroid- followed by GYN   Anxiety is worse    She is on Methimazole 5 mg, once daily     HISTORY:  Past Medical History:  Past Medical History:  Diagnosis Date   Hypertension    Hyperthyroidism    Past Surgical History:  Past Surgical History:  Procedure Laterality Date   BIOPSY  06/05/2021   Procedure: BIOPSY;  Surgeon: Rogene Houston, MD;  [facility]: [facility];  Service: Endoscopy;;   COLONOSCOPY N/A 06/05/2021   Procedure: COLONOSCOPY;  Surgeon: Rogene Houston, MD;  [facility]: [facility];  Service: Endoscopy;  Laterality: N/A;  830   ESOPHAGOGASTRODUODENOSCOPY N/A 06/05/2021   Procedure: ESOPHAGOGASTRODUODENOSCOPY (EGD);  Surgeon: Rogene Houston, MD;  Location: [facility];  Service:  Endoscopy;  Laterality: N/A;   FOOT SURGERY Right    LEG SURGERY Right    TUBAL LIGATION     Social History:  reports that she quit smoking about 1 years ago. Her smoking use included cigarettes. She has never used smokeless tobacco. She reports current alcohol use of about 3.0 standard drinks of alcohol per week. She reports that she does not use drugs. Family History:  Family History  Problem Relation Age of Onset   CAD Mother        Premature disease   Hypertension Sister    Breast cancer Maternal Grandmother    HIV Sister    Asthma Son      HOME MEDICATIONS: Allergies as of 06/19/2021       Reactions   Latex Hives   Other Rash   GLOVE POWDER         Medication List        Accurate as of June 19, 2021  9:05 AM. If you have any questions, ask your nurse or doctor.          amLODipine 5 MG tablet Commonly known as: NORVASC Take 1 tablet (5 mg total) by mouth daily.   BLACK CURRANT SEED OIL PO Take 5 mLs by mouth daily.   famotidine 40 MG tablet Commonly known as: PEPCID Take 1 tablet (40 mg total) by mouth at bedtime as needed for heartburn or indigestion.  Fish Oil 1200 MG Caps Take 2,400 mg by mouth daily.   methimazole 5 MG tablet Commonly known as: TAPAZOLE Take 5 mg by mouth 2 (two) times daily.   multivitamin with minerals tablet Take 1 tablet by mouth daily. One a day woman   nystatin-triamcinolone cream Commonly known as: MYCOLOG II Apply 1 application topically 2 (two) times daily.   omeprazole 40 MG capsule Commonly known as: PRILOSEC Take 1 capsule (40 mg total) by mouth daily.   triamcinolone cream 0.1 % Commonly known as: KENALOG Apply 1 application topically daily as needed (Eczema).          OBJECTIVE:   PHYSICAL EXAM: VS: There were no vitals taken for this visit.   EXAM: General: Pt appears well and is in NAD  Neck: General: Supple without adenopathy. Thyroid: Right thyroid  nodule appreciated.   Lungs: Clear  with good BS bilat with no rales, rhonchi, or wheezes  Heart: Auscultation: RRR.  Abdomen: Normoactive bowel sounds, soft, nontender, without masses or organomegaly palpable  Extremities:  BL LE: No pretibial edema normal ROM and strength.  Mental Status: Judgment, insight: Intact Orientation: Oriented to time, place, and person Mood and affect: No depression, anxiety, or agitation     DATA REVIEWED:  Results for Gina Coleman, Gina Coleman (MRN [id number]) as of 12/12/2020 11:43  Ref. Range 10/31/2020 09:00  TSH Latest Ref Range: 0.350 - 4.500 uIU/mL 1.397   Thyroid Ultrasound 08/07/2020    Estimated total number of nodules >/= 1 cm: 2   Number of spongiform nodules >/=  2 cm not described below (TR1): 0   Number of mixed cystic and solid nodules >/= 1.5 cm not described below (TR2): 0   _________________________________________________________   There is an approximately 0.9 cm partially cystic though predominantly solid hypoechoic nodule within the thyroid isthmus (labeled 1) which is unchanged compared to the 05/2019 examination, and again does not meet criteria to recommend percutaneous sampling or continued dedicated follow-up.   _________________________________________________________   Nodule # 2:   Prior biopsy: No   Location: Isthmus; Inferior   Maximum size: 1.7 cm; Other 2 dimensions: 1.3 x 1.3 cm, previously, 1.9 x 1.7 x 1.1 cm   Composition: solid/almost completely solid (2)   Echogenicity: isoechoic (1)   Shape: not taller-than-wide (0)   Margins: ill-defined (0)   Echogenic foci: none (0)   ACR TI-RADS total points: 3.   ACR TI-RADS risk category:  TR3 (3 points).   Significant change in size (>/= 20% in two dimensions and minimal increase of 2 mm): No   Change in features: No   Change in ACR TI-RADS risk category: No   ACR TI-RADS recommendations:   *Given size (>/= 1.5 - 2.4 cm) and appearance, a follow-up ultrasound in 1 year should be  considered based on TI-RADS criteria.   _________________________________________________________   The previously biopsied approximately 3.9 x 2.8 x 1.4 cm hypoechoic nodule within the posterosuperior aspect the right lobe of the thyroid (labeled 3), is unchanged compared to the 05/2019 examination, previously, 3.9 x 2.8 x 1.4 cm. Correlation with previous biopsy results is advised.   _________________________________________________________   There is a punctate (approximately 0.9 cm) hypoechoic nodule within the inferior pole the right lobe of the thyroid (labeled 4), which is unchanged in hind site compared to the 05/2019 examination and again does not meet criteria to recommend percutaneous sampling or continued dedicated follow-up.   The approximately 0.9 cm hypoechoic nodule within the inferior pole the right lobe  of the thyroid (labeled 5), is unchanged compared to the 05/2019 examination and again does not meet criteria to recommend percutaneous sampling or continued dedicated follow-up.   _________________________________________________________   There is an approximately 0.7 cm isoechoic nodule/pseudonodule within the left lobe of the thyroid (labeled 6), not seen on the 05/2019 examination though does not meet criteria to recommend percutaneous sampling or continued dedicated follow-up.   IMPRESSION: 1. Findings suggestive of multinodular goiter. No worrisome new or enlarging thyroid nodules. 2. Previously biopsied dominant right-sided thyroid nodule (labeled #3), is unchanged compared to the 05/2019 examination. Correlation with previous biopsy results is advised. Assuming a benign pathologic diagnosis, repeat sampling and/or continued dedicated follow-up is not recommended. 3. Nodule #2 is unchanged compared to the 05/2019 examination though again meets imaging criteria to recommend annual/biannual surveillance. This examination documents 1 year stability.        FNA (08/02/2019)   THYROID, FINE NEEDLE ASPIRATION, RUP (SPECIMEN 1 OF 1, COLLECTED 08/02/19): CONSISTENT WITH BENIGN FOLLICULAR NODULE (BETHESDA CATEGORY II).  ASSESSMENT / PLAN / RECOMMENDATIONS:   Multinodular Goiter :   - No local neck symptoms  - S/P Benign FNA of the Right upper nodule 08/2019 - A repeat thyroid ultrasound shows stability     2. Hyperthyroidism :   -Pt is clinically and biochemically euthyroid  - She was treated because she was symptomatic  - Most likely secondary to autonomous nodule  -  No changes today    Medication: Methimazole 5 mg 1 tablet daily      F/U in 6 months     Signed electronically by: Mack Guise, MD  [facility]  [facility] [address] [place], [place] [address] Phone: [phone] FAX: [phone]      CC: Lindell Spar, [place] [address] Phone: [phone]  Fax: [phone]   Return to Endocrinology clinic as below: Future Appointments  Date Time Provider [place]  06/19/2021  9:50 AM Henri Guedes, Melanie Crazier, MD [facility] None  08/14/2021  1:00 PM Lindell Spar, MD [facility]  12/16/2021  3:00 PM Rehman, Mechele Dawley, MD [facility] None

## 2021-07-08 ENCOUNTER — Other Ambulatory Visit: Payer: Self-pay

## 2021-07-08 ENCOUNTER — Encounter: Payer: Self-pay | Admitting: Internal Medicine

## 2021-07-08 ENCOUNTER — Ambulatory Visit: Payer: BC Managed Care – PPO | Admitting: Internal Medicine

## 2021-07-08 VITALS — BP 139/89 | HR 97 | Temp 98.4°F | Resp 18 | Ht 64.0 in | Wt 208.1 lb

## 2021-07-08 DIAGNOSIS — K219 Gastro-esophageal reflux disease without esophagitis: Secondary | ICD-10-CM | POA: Diagnosis not present

## 2021-07-08 MED ORDER — ESOMEPRAZOLE MAGNESIUM 40 MG PO PACK: 40.0000 mg | PACK | Freq: Every day | ORAL | 5 refills | Status: DC

## 2021-07-08 NOTE — Patient Instructions (Signed)
Please start taking Nexium instead of Omeprazole. Take Nexium in the morning.  Okay to take Pepcid in the evening as needed.

## 2021-07-08 NOTE — Progress Notes (Signed)
Acute Office Visit  Subjective:    Patient ID: Gina Coleman, female    DOB: 28-Oct-1976, 45 y.o.   MRN: [id number]  Chief Complaint  Patient presents with   Gastroesophageal Reflux    Pt still having acid reflux this is no better had endoscopy done showed nothing nida took her off some meds she is still having chest pain     HPI Patient is in today for evaluation of acid reflux symptoms. She recently had EGD, which showed gastritis. She has been taking Omeprazole QPM and Pepcid QAM. She denies any dysphagia or odynophagia. Denies any nausea, vomiting or diarrhea. She admits that she skips meals sometimes. She was doing well before EGD when she was taking Carafate.  Past Medical History:  Diagnosis Date   Hypertension    Hyperthyroidism     Past Surgical History:  Procedure Laterality Date   BIOPSY  06/05/2021   Procedure: BIOPSY;  Surgeon: Rogene Houston, MD;  [facility]: [facility];  Service: Endoscopy;;   COLONOSCOPY N/A 06/05/2021   Procedure: COLONOSCOPY;  Surgeon: Rogene Houston, MD;  [facility]: [facility];  Service: Endoscopy;  Laterality: N/A;  830   ESOPHAGOGASTRODUODENOSCOPY N/A 06/05/2021   Procedure: ESOPHAGOGASTRODUODENOSCOPY (EGD);  Surgeon: Rogene Houston, MD;  Location: [facility];  Service: Endoscopy;  Laterality: N/A;   FOOT SURGERY Right    LEG SURGERY Right    TUBAL LIGATION      Family History  Problem Relation Age of Onset   CAD Mother        Premature disease   Hypertension Sister    Breast cancer Maternal Grandmother    HIV Sister    Asthma Son     Social History   Socioeconomic History   Marital status: Married    Spouse name: Not on file   Number of children: Not on file   Years of education: Not on file   Highest education level: Not on file  Occupational History   Not on file  Tobacco Use   Smoking status: Former    Types: Cigarettes    Quit date: 07/06/2019    Years since quitting: 2.0   Smokeless tobacco: Never  Vaping  Use   Vaping Use: Never used  Substance and Sexual Activity   Alcohol use: Yes    Alcohol/week: 3.0 standard drinks    Types: 3 Glasses of wine per week    Comment: social    Drug use: Never   Sexual activity: Yes    Birth control/protection: Surgical    Comment: tubal  Other Topics Concern   Not on file  Social History Narrative   Not on file   Social Determinants of Health   Financial Resource Strain: Low Risk    Difficulty of Paying Living Expenses: Not hard at all  Food Insecurity: No Food Insecurity   Worried About Charity fundraiser in the Last Year: Never true   [place] in the Last Year: Never true  Transportation Needs: No Transportation Needs   Lack of Transportation (Medical): No   Lack of Transportation (Non-Medical): No  Physical Activity: Inactive   Days of Exercise per Week: 0 days   Minutes of Exercise per Session: 0 min  Stress: No Stress Concern Present   Feeling of Stress : Only a little  Social Connections: Moderately Isolated   Frequency of Communication with Friends and Family: More than three times a week   Frequency of Social Gatherings  with Friends and Family: Once a week   Attends Religious Services: Never   Marine scientist or Organizations: No   Attends Music therapist: Never   Marital Status: Married  Human resources officer Violence: Not At Risk   Fear of Current or Ex-Partner: No   Emotionally Abused: No   Physically Abused: No   Sexually Abused: No    Outpatient Medications Prior to Visit  Medication Sig Dispense Refill   amLODipine (NORVASC) 5 MG tablet Take 1 tablet (5 mg total) by mouth daily. 90 tablet 1   BLACK CURRANT SEED OIL PO Take 5 mLs by mouth daily.     famotidine (PEPCID) 40 MG tablet Take 1 tablet (40 mg total) by mouth at bedtime as needed for heartburn or indigestion. 90 tablet 0   methimazole (TAPAZOLE) 5 MG tablet Take 5 mg by mouth 2 (two) times daily.     Multiple Vitamins-Minerals  (MULTIVITAMIN WITH MINERALS) tablet Take 1 tablet by mouth daily. One a day woman     nystatin-triamcinolone (MYCOLOG II) cream Apply 1 application topically 2 (two) times daily. 30 g 0   Omega-3 Fatty Acids (FISH OIL) 1200 MG CAPS Take 2,400 mg by mouth daily.     triamcinolone cream (KENALOG) 0.1 % Apply 1 application topically daily as needed (Eczema).     omeprazole (PRILOSEC) 40 MG capsule Take 1 capsule (40 mg total) by mouth daily. 30 capsule 3   No facility-administered medications prior to visit.    Allergies  Allergen Reactions   Latex Hives   Other Rash     GLOVE POWDER      Review of Systems  Constitutional:  Negative for chills and fever.  HENT:  Negative for congestion, sinus pressure, sinus pain and sore throat.   Eyes:  Negative for pain and discharge.  Respiratory:  Negative for cough and shortness of breath.   Cardiovascular:  Negative for chest pain and palpitations.  Gastrointestinal:  Positive for abdominal pain (epigastric). Negative for constipation, diarrhea, nausea and vomiting.  Endocrine: Negative for polydipsia and polyuria.  Genitourinary:  Negative for dysuria and hematuria.  Musculoskeletal:  Positive for neck pain. Negative for neck stiffness.  Skin:  Negative for rash.  Neurological:  Negative for dizziness and weakness.  Psychiatric/Behavioral:  Negative for agitation and behavioral problems.       Objective:    Physical Exam Vitals reviewed.  Constitutional:      General: She is not in acute distress.    Appearance: She is obese. She is not diaphoretic.  HENT:     Head: Normocephalic and atraumatic.     Nose: Nose normal. No congestion.     Mouth/Throat:     Mouth: Mucous membranes are moist.     Pharynx: No posterior oropharyngeal erythema.  Eyes:     General: No scleral icterus.    Extraocular Movements: Extraocular movements intact.     Pupils: Pupils are equal, round, and reactive to light.  Neck:     Thyroid: Thyromegaly  present. No thyroid tenderness.  Cardiovascular:     Rate and Rhythm: Normal rate and regular rhythm.     Pulses: Normal pulses.     Heart sounds: Normal heart sounds. No murmur heard.   No friction rub. No gallop.  Pulmonary:     Breath sounds: Normal breath sounds. No wheezing or rales.  Abdominal:     Palpations: Abdomen is soft.     Tenderness: There is no abdominal tenderness.  Musculoskeletal:     Cervical back: Normal range of motion. No rigidity.     Right lower leg: No edema.     Left lower leg: No edema.  Skin:    General: Skin is warm.     Findings: No rash.  Neurological:     General: No focal deficit present.     Mental Status: She is alert and oriented to person, place, and time.     Sensory: No sensory deficit.     Motor: No weakness.  Psychiatric:        Mood and Affect: Mood normal.        Behavior: Behavior normal.    BP 139/89 (BP Location: Left Arm, Patient Position: Sitting, Cuff Size: Normal)   Pulse 97   Temp 98.4 F (36.9 C) (Oral)   Resp 18   Ht '5\' 4"'$  (1.626 m)   Wt 208 lb 1.9 oz (94.4 kg)   SpO2 97%   BMI 35.72 kg/m  Wt Readings from Last 3 Encounters:  07/08/21 208 lb 1.9 oz (94.4 kg)  06/05/21 213 lb (96.6 kg)  05/13/21 216 lb (98 kg)    Health Maintenance Due  Topic Date Due   Pneumococcal Vaccine 36-3 Years old (1 - PCV) Never done   HIV Screening  Never done   Hepatitis C Screening  Never done   TETANUS/TDAP  Never done   COVID-19 Vaccine (3 - Moderna risk series) 08/05/2020   INFLUENZA VACCINE  06/30/2021    There are no preventive care reminders to display for this patient.   Lab Results  Component Value Date   TSH 1.397 10/31/2020   Lab Results  Component Value Date   WBC 7.2 05/01/2021   HGB 13.1 05/01/2021   HCT 41.7 05/01/2021   MCV 89.7 05/01/2021   PLT 280 05/01/2021   Lab Results  Component Value Date   NA 135 05/01/2021   K 3.9 05/01/2021   CO2 26 05/01/2021   GLUCOSE 103 (H) 05/01/2021   BUN 13  05/01/2021   CREATININE 0.71 05/01/2021   BILITOT 0.4 05/01/2021   ALKPHOS 123 05/01/2021   AST 16 05/01/2021   ALT 18 05/01/2021   PROT 8.1 05/01/2021   ALBUMIN 3.9 05/01/2021   CALCIUM 8.9 05/01/2021   ANIONGAP 6 05/01/2021   GFR 69.56 05/13/2020   Lab Results  Component Value Date   CHOL 198 10/30/2019   Lab Results  Component Value Date   HDL 58 10/30/2019   Lab Results  Component Value Date   LDLCALC 126 (H) 10/30/2019   Lab Results  Component Value Date   TRIG 52 10/30/2019   Lab Results  Component Value Date   CHOLHDL 3.4 10/30/2019   No results found for: HGBA1C     Assessment & Plan:   Problem List Items Addressed This Visit       Digestive   GERD (gastroesophageal reflux disease) - Primary    Was on Omeprazole and PRN Pepcid Still has persistent symptoms Changed to Nexium Recent EGD report reviewed - gastritis Follow up with GI Avoid hot and spicy food Needs to eat at regular intervals, last meal at least 2 hours before bedtime       Relevant Medications   esomeprazole (NEXIUM) 40 MG packet     Meds ordered this encounter  Medications   esomeprazole (NEXIUM) 40 MG packet    Sig: Take 40 mg by mouth daily before breakfast.    Dispense:  30 each  Refill:  5     Abran Gavigan Keith Rake, MD

## 2021-07-08 NOTE — Assessment & Plan Note (Signed)
Was on Omeprazole and PRN Pepcid Still has persistent symptoms Changed to Nexium Recent EGD report reviewed - gastritis Follow up with GI Avoid hot and spicy food Needs to eat at regular intervals, last meal at least 2 hours before bedtime

## 2021-07-09 ENCOUNTER — Encounter: Payer: Self-pay | Admitting: Adult Health

## 2021-07-09 ENCOUNTER — Encounter: Payer: Self-pay | Admitting: Emergency Medicine

## 2021-07-09 ENCOUNTER — Ambulatory Visit: Payer: BC Managed Care – PPO | Admitting: Adult Health

## 2021-07-09 ENCOUNTER — Other Ambulatory Visit: Payer: Self-pay

## 2021-07-09 ENCOUNTER — Ambulatory Visit
Admission: EM | Admit: 2021-07-09 | Discharge: 2021-07-09 | Disposition: A | Discharge status: Home / Self Care | Payer: BC Managed Care – PPO | Attending: Family Medicine | Admitting: Family Medicine

## 2021-07-09 VITALS — BP 110/75 | HR 82 | Ht 64.0 in | Wt 210.0 lb

## 2021-07-09 DIAGNOSIS — K219 Gastro-esophageal reflux disease without esophagitis: Secondary | ICD-10-CM

## 2021-07-09 DIAGNOSIS — N9419 Other specified dyspareunia: Secondary | ICD-10-CM | POA: Diagnosis not present

## 2021-07-09 MED ORDER — ALUM & MAG HYDROXIDE-SIMETH 200-200-20 MG/5ML PO SUSP
30.0000 mL | Freq: Once | ORAL | Status: AC
  Administered 2021-07-09: 30 mL via ORAL

## 2021-07-09 MED ORDER — LIDOCAINE VISCOUS HCL 2 % MT SOLN
15.0000 mL | Freq: Once | OROMUCOSAL | Status: AC
  Administered 2021-07-09: 15 mL via ORAL

## 2021-07-09 NOTE — Progress Notes (Signed)
  Subjective:     Patient ID: Gina Coleman, female   DOB: [birth year], 45 y.o.   MRN: [id number]  HPI  Gina Coleman is a 45 year old black female,married, [id number], in complaining of pain after sex for about a week. PCP is D rPatel.  Review of Systems Has pain after sex, not during Reviewed past medical,surgical, social and family history. Reviewed medications and allergies.     Objective:   Physical Exam BP 110/75 (BP Location: Left Arm, Patient Position: Sitting, Cuff Size: Large)   Pulse 82   Ht '5\' 4"'$  (1.626 m)   Wt 210 lb (95.3 kg)   LMP 06/30/2021   BMI 36.05 kg/m     Skin warm and dry.Pelvic: external genitalia is normal in appearance no lesions,no pain at introitus, vagina: pink and moist,urethra has no lesions or masses noted, cervix:smooth and bulbous, uterus: normal size, shape and contour, mildly tender, no masses felt, adnexa: no masses or tenderness noted. Bladder is non tender and no masses felt, after exam felt like it does after sex at times,radiates to butt. Fall risk is low  Upstream - 07/09/21 1531       Pregnancy Intention Screening   Does the patient want to become pregnant in the next year? No    Does the patient's partner want to become pregnant in the next year? No    Would the patient like to discuss contraceptive options today? No      Contraception Wrap Up   Current Method Female Sterilization    End Method Female Sterilization    Contraception Counseling Provided No           Examination chaperoned by Levy Pupa LPN  Assessment:     1. Other specified dyspareunia Has pain after sex, like contractions, Have husband pull out to see if the helps    Plan:     Follow up in 4 weeks

## 2021-07-09 NOTE — ED Triage Notes (Addendum)
Chest pain in center of chest x 4-5 months.  Seen pcp yesterday and was told she has acid reflux.  Has been seen at ED for same problem, reports given GI cocktail with relief.

## 2021-07-09 NOTE — ED Provider Notes (Signed)
[name]   [id number] 07/09/21 Arrival Time: 1010  ASSESSMENT & PLAN:  1. Gastroesophageal reflux disease without esophagitis    Difficult to manage. Has scheduled GI f/u. Has had endoscopy. Will call her GI specialist to see if they can see her sooner. No change in medication management; taking as directed.  Meds ordered this encounter  Medications   AND Linked Order Group    alum & mag hydroxide-simeth (MAALOX/MYLANTA) 200-200-20 MG/5ML suspension 30 mL    lidocaine (XYLOCAINE) 2 % viscous mouth solution 15 mL     Follow-up Information     Schedule an appointment as soon as possible for a visit  with Lindell Spar, MD.   Specialty: Internal Medicine Contact information: [address] [place] [address] [phone]                  Reviewed expectations re: course of current medical issues. Questions answered. Outlined signs and symptoms indicating need for more acute intervention. Patient verbalized understanding. After Visit Summary given.   SUBJECTIVE: History from: patient. Gina Coleman is a 45 y.o. female who presents with dily acid reflux pain. Followed by her PCP and GI. Recently changed to Nexium '40mg'$  + Pepcid '40mg'$  BID. Describes heartburn bloating belching. Worse after meals. Trying to change diet. No SOB. Fever: absent. Appetite: normal. PO intake: decreased. Ambulatory without assistance. Normal bowel/bladder habits.  Past Surgical History:  Procedure Laterality Date   BIOPSY  06/05/2021   Procedure: BIOPSY;  Surgeon: Rogene Houston, MD;  [facility]: [facility];  Service: Endoscopy;;   COLONOSCOPY N/A 06/05/2021   Procedure: COLONOSCOPY;  Surgeon: Rogene Houston, MD;  [facility];  Service: Endoscopy;  Laterality: N/A;  830   ESOPHAGOGASTRODUODENOSCOPY N/A 06/05/2021   Procedure: ESOPHAGOGASTRODUODENOSCOPY (EGD);  Surgeon: Rogene Houston, MD;  [facility]: [facility];  Service: Endoscopy;  Laterality: N/A;    FOOT SURGERY Right    LEG SURGERY Right    TUBAL LIGATION       OBJECTIVE:  Vitals:   07/09/21 1024  BP: 121/78  Pulse: 66  Resp: 18  Temp: 98.5 F (36.9 C)  TempSrc: Oral  SpO2: 98%    General appearance: alert, oriented, no acute distress HEENT: [place]; AT; oropharynx moist Lungs: unlabored respirations Abdomen: soft; without distention; no specific tenderness to palpation;  Skin: warm and dry Neurologic: normal gait Psychological: alert and cooperative; normal mood and affect  Labs: Results for orders placed or performed in visit on 06/11/21  [facility] COLONOSCOPY  Result Value Ref Range   HM Colonoscopy See Report (in chart) See Report (in chart), Patient Reported     Allergies  Allergen Reactions   Latex Hives   Other Rash     GLOVE POWDER                                                 Past Medical History:  Diagnosis Date   Hypertension    Hyperthyroidism     Social History   Socioeconomic History   Marital status: Married    Spouse name: Not on file   Number of children: Not on file   Years of education: Not on file   Highest education level: Not on file  Occupational History   Not on file  Tobacco Use   Smoking status: Former  Types: Cigarettes    Quit date: 07/06/2019    Years since quitting: 2.0   Smokeless tobacco: Never  Vaping Use   Vaping Use: Never used  Substance and Sexual Activity   Alcohol use: Yes    Alcohol/week: 3.0 standard drinks    Types: 3 Glasses of wine per week    Comment: social    Drug use: Never   Sexual activity: Yes    Birth control/protection: Surgical    Comment: tubal  Other Topics Concern   Not on file  Social History Narrative   Not on file   Social Determinants of Health   Financial Resource Strain: Low Risk    Difficulty of Paying Living Expenses: Not hard at all  Food Insecurity: No Food Insecurity   Worried About Charity fundraiser in the Last Year: Never true   [place] in the Last Year:  Never true  Transportation Needs: No Transportation Needs   Lack of Transportation (Medical): No   Lack of Transportation (Non-Medical): No  Physical Activity: Inactive   Days of Exercise per Week: 0 days   Minutes of Exercise per Session: 0 min  Stress: No Stress Concern Present   Feeling of Stress : Only a little  Social Connections: Moderately Isolated   Frequency of Communication with Friends and Family: More than three times a week   Frequency of Social Gatherings with Friends and Family: Once a week   Attends Religious Services: Never   Marine scientist or Organizations: No   Attends Archivist Meetings: Never   Marital Status: Married  Human resources officer Violence: Not At Risk   Fear of Current or Ex-Partner: No   Emotionally Abused: No   Physically Abused: No   Sexually Abused: No    Family History  Problem Relation Age of Onset   CAD Mother        Premature disease   Hypertension Sister    Breast cancer Maternal Grandmother    HIV Sister    Asthma Son      Vanessa Kick, MD 07/09/21 1102

## 2021-07-14 ENCOUNTER — Ambulatory Visit (INDEPENDENT_AMBULATORY_CARE_PROVIDER_SITE_OTHER): Payer: BC Managed Care – PPO | Admitting: Gastroenterology

## 2021-07-14 ENCOUNTER — Other Ambulatory Visit: Payer: Self-pay

## 2021-07-14 ENCOUNTER — Encounter (INDEPENDENT_AMBULATORY_CARE_PROVIDER_SITE_OTHER): Payer: Self-pay | Admitting: Gastroenterology

## 2021-07-14 VITALS — BP 128/81 | HR 91 | Temp 99.0°F | Ht 64.0 in | Wt 210.0 lb

## 2021-07-14 DIAGNOSIS — K219 Gastro-esophageal reflux disease without esophagitis: Secondary | ICD-10-CM

## 2021-07-14 NOTE — Progress Notes (Signed)
Referring Provider: Lindell Spar, MD Primary Care Physician:  Lindell Spar, MD Primary GI Physician: Jenetta Downer  Chief Complaint  Patient presents with   Gastroesophageal Reflux    Acid reflux for 6 days. Went to pcp last week and was given nexium. Has taken for 3 days. Has been taking famotidine at night. Having nausea, dark stools, gas, bloating, heartburn, has one stool a day, eats 3 small meals a day    HPI:   Gina Coleman is a 45 y.o. female with pmh of GERD, HTN and hyperthyroidism. Patient here for 6 month follow up of GERD, recent EGD and Colonoscopy in July 2022, findings as listed below.   GERD: states she has been having very bad acid reflux, acid regurgitation and heaviness/burning in her chest after eating as well as a thick lump in her throat when swallowing. She reports having symptoms prior to EGD in July, and even going to the ER in June because of concerns pain was cardiac related. She feels as though symptoms have worsened even since EGD in July. States she saw her PCP las week who gave her nexium '40mg'$  to take once daily, has been taking this for 5 days and symptoms have not improved. She is also taking famotidine nightly without relief. She reports certain foods make symptoms worse so is trying to avoid anything that exacerbates her symptoms, to include smoking, however, pain is always there. Reports precipitating event a few months back when she drank some red wine, has had these symptoms since.   Hemorrhoids: States that she had a hemorrhoid was protruding and causing pain and irritation shortly after her colonoscopy in July, she was given mycolog II cream which she states resolved symptoms. Feels that Hemorrhoid has now retracted, no longer causing any issues.    Last Colonoscopy:(06/05/21)The examined portion of the ileum was normal. - One 4 mm polyp in the cecum. (Tubular adenoma) - External hemorrhoids.  Last Endoscopy:(7/722)- Normal hypopharynx. -  Normal esophagus. - Z-line irregular, 36 cm from the incisors. - Gastritis. Biopsied (reactive gastropathy) - Normal duodenal bulb and second portion of the duodenum.  Recommendations:  Repeat surveillance Colonoscopy 5 years  Past Medical History:  Diagnosis Date   Acid reflux    Hypertension    Hyperthyroidism     Past Surgical History:  Procedure Laterality Date   BIOPSY  06/05/2021   Procedure: BIOPSY;  Surgeon: Rogene Houston, MD;  Location: [facility];  Service: Endoscopy;;   COLONOSCOPY N/A 06/05/2021   rehman:The examined portion of the ileum was normal. one 16m polyp in cecum (tubular adenoma). external hemorrhoids   ESOPHAGOGASTRODUODENOSCOPY N/A 06/05/2021   Rehman:Normal hypopharynx.normal esophagus, z line irregular 36 cm from incisors, gastritics with reactive gastropathy, normal duodenal bulb and second portion of duodenum   FOOT SURGERY Right    LEG SURGERY Right    TUBAL LIGATION      Current Outpatient Medications  Medication Sig Dispense Refill   amLODipine (NORVASC) 5 MG tablet Take 1 tablet (5 mg total) by mouth daily. 90 tablet 1   esomeprazole (NEXIUM) 40 MG packet Take 40 mg by mouth daily before breakfast. 30 each 5   famotidine (PEPCID) 40 MG tablet Take 1 tablet (40 mg total) by mouth at bedtime as needed for heartburn or indigestion. 90 tablet 0   methimazole (TAPAZOLE) 5 MG tablet Take 5 mg by mouth 2 (two) times daily.     Multiple Vitamins-Minerals (MULTIVITAMIN WITH MINERALS) tablet Take 1 tablet  by mouth daily. One a day woman     Omega-3 Fatty Acids (FISH OIL) 1200 MG CAPS Take 2,400 mg by mouth daily.     triamcinolone cream (KENALOG) 0.1 % Apply 1 application topically daily as needed (Eczema).     No current facility-administered medications for this visit.    Allergies as of 07/14/2021 - Review Complete 07/14/2021  Allergen Reaction Noted   Latex Hives 07/05/2019   Other Rash 09/27/2011    Family History  Problem Relation Age  of Onset   CAD Mother        Premature disease   Hypertension Sister    Breast cancer Maternal Grandmother    HIV Sister    Asthma Son     Social History   Socioeconomic History   Marital status: Married    Spouse name: Not on file   Number of children: Not on file   Years of education: Not on file   Highest education level: Not on file  Occupational History   Not on file  Tobacco Use   Smoking status: Former    Types: Cigarettes    Quit date: 07/09/2021    Years since quitting: 0.0   Smokeless tobacco: Never  Vaping Use   Vaping Use: Never used  Substance and Sexual Activity   Alcohol use: Yes    Alcohol/week: 3.0 standard drinks    Types: 3 Glasses of wine per week    Comment: social    Drug use: Never   Sexual activity: Yes    Birth control/protection: Surgical    Comment: tubal  Other Topics Concern   Not on file  Social History Narrative   Not on file   Social Determinants of Health   Financial Resource Strain: Low Risk    Difficulty of Paying Living Expenses: Not hard at all  Food Insecurity: No Food Insecurity   Worried About Charity fundraiser in the Last Year: Never true   Adrian in the Last Year: Never true  Transportation Needs: No Transportation Needs   Lack of Transportation (Medical): No   Lack of Transportation (Non-Medical): No  Physical Activity: Inactive   Days of Exercise per Week: 0 days   Minutes of Exercise per Session: 0 min  Stress: No Stress Concern Present   Feeling of Stress : Only a little  Social Connections: Moderately Isolated   Frequency of Communication with Friends and Family: More than three times a week   Frequency of Social Gatherings with Friends and Family: Once a week   Attends Religious Services: Never   Marine scientist or Organizations: No   Attends Music therapist: Never   Marital Status: Married   Review of Systems: Gen: Denies fever, chills, anorexia. Denies fatigue, weakness,  weight loss.  CV: Denies chest pain, palpitations, syncope, peripheral edema, and claudication. Resp: Denies dyspnea at rest, cough, wheezing, coughing up blood, and pleurisy. GI: Denies vomiting blood, jaundice, and fecal incontinence. Denies dysphagia or odynophagia. Endorses burning and heaviness in chest, endorses acid regurgitation, endorses thick lump in throat when swallowing.  Derm: Denies rash, itching, dry skin Psych: Denies depression, anxiety, memory loss, confusion. No homicidal or suicidal ideation.  Heme: Denies bruising, bleeding, and enlarged lymph nodes.  Physical Exam: BP 128/81 (BP Location: Right Arm, Patient Position: Sitting, Cuff Size: Large)   Pulse 91   Temp 99 F (37.2 C) (Oral)   Ht '5\' 4"'$  (1.626 m)   Wt 210 lb (  95.3 kg)   LMP 06/30/2021   BMI 36.05 kg/m  General:   Alert and oriented. No distress noted. Pleasant and cooperative.  Head:  Normocephalic and atraumatic. Eyes:  Conjuctiva clear without scleral icterus. Mouth:  Oral mucosa pink and moist. Good dentition. No lesions. Heart: Normal rate and rhythm, s1 and s2 heart sounds present.  Lungs: Clear lung sounds in all lobes. Respirations equal and unlabored. Abdomen:  +BS, soft, non-tender and non-distended. No rebound or guarding. No HSM or masses noted. Derm: No palmar erythema or jaundice Msk:  Symmetrical without gross deformities. Normal posture. Extremities:  Without edema. Neurologic:  Alert and  oriented x4 Psych:  Alert and cooperative. Normal mood and affect.  ASSESSMENT: Gina Coleman is a 45 y.o. female presenting today for ongoing symptoms of reflux. Most recent colonoscopy and EGD done in July 2022.  Patient complains of ongoing burning/heaviness in her chest as well as a thick lump in her throat when swallowing that have been present for the past few months. States she even went to the ER a few months back, prior having EGD because she was concerned symptoms were cardiac related. She  endorses acid regurgitation. Symptoms worse after certain foods so she has been trying to avoid things that exacerbate her symptoms. Saw PCP last week who prescribed nexium '40mg'$  once daily. She reports no improvement in symptoms. Currently on day 5 of medication and is also taking pepcid nightly without relief. She states that symptoms are affecting her life because she is so uncomfortable. We will increase nexium to twice daily, continue pepcid at night. Discussed avoiding smoking, alcohol, caffeine, spicy, greasy, fried foods, sitting upright 1-2 hours after eating. Patient to call with an update on her symptoms in 3-4 weeks. We will see her back in 6 weeks, if symptoms not improved we will further discuss pH impendence testing.   We discussed in depth that her symptoms are consistent with acid reflux and recent cardiac work up in ED in June was reassuring however, due to ongoing chest heaviness, patient advised of symptoms that would indicate need for evaluation in ED to include worsening chest pain, shortness of breath, L arm or neck pain, chest pain radiating to back or syncope. Patient verbalized understanding.   PLAN:  Increase nexium '40mg'$  to BID 2. Continue pepcid nightly 3.continue to avoid foods/drinks that exacerbate symptoms 4. Call with update on symptoms in 3-4 weeks 5. Stay sitting upright 1-2 hours after eating 6. Possible pH impedence testing (off PPI) if symptoms not improved with twice daily dosing   Follow Up: 6 weeks  Case discussed with Dr. Jenetta Downer who is in agreement with plan of care, as outlined above.   Yovanni Frenette L. Alver Sorrow, MSN, APRN, AGNP-C Adult-Gerontology Nurse Practitioner [facility]

## 2021-07-14 NOTE — Patient Instructions (Addendum)
Increase your nexium to twice a day, take morning dose 30-45 minutes prior to eating and evening dose prior to dinner. You can continue the pepcid at night. Give [place] a call in 3-44 weeks to let us know how your symptoms are.   We will consider pH impedence testing if symptoms have not improved. Continue to avoid alcohol, caffeine, greasy, fried, spicy foods and smoking as these can worsen your reflux.  It was a pleasure caring for you today!  Follow up in 6 weeks

## 2021-07-16 ENCOUNTER — Emergency Department (HOSPITAL_COMMUNITY)
Admission: EM | Admit: 2021-07-16 | Discharge: 2021-07-16 | Disposition: A | Discharge status: Home / Self Care | Payer: BC Managed Care – PPO | Attending: Emergency Medicine | Admitting: Emergency Medicine

## 2021-07-16 ENCOUNTER — Encounter (HOSPITAL_COMMUNITY): Payer: Self-pay | Admitting: Emergency Medicine

## 2021-07-16 ENCOUNTER — Other Ambulatory Visit: Payer: Self-pay

## 2021-07-16 DIAGNOSIS — K30 Functional dyspepsia: Secondary | ICD-10-CM | POA: Diagnosis not present

## 2021-07-16 DIAGNOSIS — R072 Precordial pain: Secondary | ICD-10-CM | POA: Diagnosis not present

## 2021-07-16 DIAGNOSIS — R079 Chest pain, unspecified: Secondary | ICD-10-CM

## 2021-07-16 DIAGNOSIS — I10 Essential (primary) hypertension: Secondary | ICD-10-CM | POA: Insufficient documentation

## 2021-07-16 DIAGNOSIS — K219 Gastro-esophageal reflux disease without esophagitis: Secondary | ICD-10-CM | POA: Insufficient documentation

## 2021-07-16 DIAGNOSIS — R12 Heartburn: Secondary | ICD-10-CM | POA: Diagnosis not present

## 2021-07-16 DIAGNOSIS — Z91040 Latex allergy status: Secondary | ICD-10-CM | POA: Insufficient documentation

## 2021-07-16 DIAGNOSIS — Z87891 Personal history of nicotine dependence: Secondary | ICD-10-CM | POA: Diagnosis not present

## 2021-07-16 DIAGNOSIS — R0789 Other chest pain: Secondary | ICD-10-CM | POA: Diagnosis not present

## 2021-07-16 DIAGNOSIS — Z79899 Other long term (current) drug therapy: Secondary | ICD-10-CM | POA: Diagnosis not present

## 2021-07-16 LAB — COMPREHENSIVE METABOLIC PANEL
ALT: 21 U/L (ref 0–44)
AST: 15 U/L (ref 15–41)
Albumin: 3.4 g/dL — ABNORMAL LOW (ref 3.5–5.0)
Alkaline Phosphatase: 99 U/L (ref 38–126)
Anion gap: 8 (ref 5–15)
BUN: 13 mg/dL (ref 6–20)
CO2: 28 mmol/L (ref 22–32)
Calcium: 8.1 mg/dL — ABNORMAL LOW (ref 8.9–10.3)
Chloride: 104 mmol/L (ref 98–111)
Creatinine, Ser: 0.66 mg/dL (ref 0.44–1.00)
GFR, Estimated: >60 mL/min (ref >60)
Glucose, Bld: 112 mg/dL — ABNORMAL HIGH (ref 70–99)
Potassium: 3.7 mmol/L (ref 3.5–5.1)
Sodium: 140 mmol/L (ref 135–145)
Total Bilirubin: 0.4 mg/dL (ref 0.3–1.2)
Total Protein: 6.9 g/dL (ref 6.5–8.1)

## 2021-07-16 LAB — CBC WITH DIFFERENTIAL/PLATELET
Abs Immature Granulocytes: 0.01 10*3/uL (ref 0.00–0.07)
Basophils Absolute: 0 10*3/uL (ref 0.0–0.1)
Basophils Relative: 0 %
Eosinophils Absolute: 0.3 10*3/uL (ref 0.0–0.5)
Eosinophils Relative: 5 %
HCT: 36.7 % (ref 36.0–46.0)
Hemoglobin: 11.7 g/dL — ABNORMAL LOW (ref 12.0–15.0)
Immature Granulocytes: 0 %
Lymphocytes Relative: 32 %
Lymphs Abs: 2 10*3/uL (ref 0.7–4.0)
MCH: 28.7 pg (ref 26.0–34.0)
MCHC: 31.9 g/dL (ref 30.0–36.0)
MCV: 90.2 fL (ref 80.0–100.0)
Monocytes Absolute: 0.6 10*3/uL (ref 0.1–1.0)
Monocytes Relative: 10 %
Neutro Abs: 3.4 10*3/uL (ref 1.7–7.7)
Neutrophils Relative %: 53 %
Platelets: 230 10*3/uL (ref 150–400)
RBC: 4.07 MIL/uL (ref 3.87–5.11)
RDW: 13.2 % (ref 11.5–15.5)
WBC: 6.4 10*3/uL (ref 4.0–10.5)
nRBC: 0 % (ref 0.0–0.2)

## 2021-07-16 LAB — TROPONIN I (HIGH SENSITIVITY): Troponin I (High Sensitivity): <2 ng/L (ref <18)

## 2021-07-16 MED ORDER — LIDOCAINE VISCOUS HCL 2 % MT SOLN
15.0000 mL | Freq: Once | OROMUCOSAL | Status: AC
  Administered 2021-07-16: 15 mL via OROMUCOSAL
  Filled 2021-07-16: qty 15

## 2021-07-16 MED ORDER — ALUM & MAG HYDROXIDE-SIMETH 200-200-20 MG/5ML PO SUSP
30.0000 mL | Freq: Once | ORAL | Status: AC
  Administered 2021-07-16: 30 mL via ORAL
  Filled 2021-07-16: qty 30

## 2021-07-16 MED ORDER — SUCRALFATE 1 G PO TABS: 1.0000 g | ORAL_TABLET | Freq: Three times a day (TID) | ORAL | 0 refills | Status: DC

## 2021-07-16 MED ORDER — LIDOCAINE VISCOUS HCL 2 % MT SOLN: 15.0000 mL | OROMUCOSAL | 3 refills | Status: DC | PRN

## 2021-07-16 NOTE — ED Triage Notes (Signed)
Pt c/o chest pain since Thursday and was diagnosed with acid reflux. Pt states she has been taking nexium with no relief.

## 2021-07-16 NOTE — ED Provider Notes (Signed)
[facility] EMERGENCY DEPARTMENT Provider Note   CSN: [id number] Arrival date & time: 07/16/21  [id number]     History Chief Complaint  Patient presents with  . Chest Pain    Gina Coleman is a 45 y.o. female.  The history is provided by the patient.  Chest Pain Pain location:  Substernal area and L chest Pain quality: sharp and stabbing   Pain radiates to:  Neck and epigastrium Pain severity:  Moderate Duration:  2 weeks Timing:  Intermittent Progression:  Waxing and waning Chronicity:  New Relieved by:  None tried Worsened by:  Nothing Ineffective treatments:  None tried Associated symptoms: heartburn and nausea   Associated symptoms: no abdominal pain, no back pain, no fatigue, no fever and no vomiting       Past Medical History:  Diagnosis Date  . Acid reflux   . Hypertension   . Hyperthyroidism     Patient Active Problem List   Diagnosis Date Noted  . Other specified dyspareunia 07/09/2021  . GERD (gastroesophageal reflux disease) 02/11/2021  . Neck pain 12/09/2020  . Breast pain, left 10/17/2020  . Fibroids, subserous 09/13/2020  . Dysmenorrhea 09/13/2020  . Menorrhagia with regular cycle 09/13/2020  . Dysmenorrhea, unspecified 08/22/2020  . Peripheral neuropathy 08/22/2020  . Chronic back pain 06/14/2020  . Abnormal mammogram 06/14/2020  . Encounter for Papanicolaou smear of cervix 06/14/2020  . Hypertension 11/18/2019  . Subclinical hyperthyroidism 09/08/2019  . Multinodular goiter 07/12/2019  . Chest pain 02/23/2018    Past Surgical History:  Procedure Laterality Date  . BIOPSY  06/05/2021   Procedure: BIOPSY;  Surgeon: Rogene Houston, MD;  Location: [facility];  Service: Endoscopy;;  . COLONOSCOPY N/A 06/05/2021   rehman:The examined portion of the ileum was normal. one 40m polyp in cecum (tubular adenoma). external hemorrhoids  . ESOPHAGOGASTRODUODENOSCOPY N/A 06/05/2021   Rehman:Normal hypopharynx.normal esophagus, z line irregular 36  cm from incisors, gastritics with reactive gastropathy, normal duodenal bulb and second portion of duodenum  . FOOT SURGERY Right   . LEG SURGERY Right   . TUBAL LIGATION       OB History     Gravida  3   Para  3   Term  1   Preterm  2   AB      Living  3      SAB      IAB      Ectopic      Multiple      Live Births  3           Family History  Problem Relation Age of Onset  . CAD Mother        Premature disease  . Hypertension Sister   . Breast cancer Maternal Grandmother   . HIV Sister   . Asthma Son     Social History   Tobacco Use  . Smoking status: Former    Types: Cigarettes    Quit date: 07/09/2021    Years since quitting: 0.0  . Smokeless tobacco: Never  Vaping Use  . Vaping Use: Never used  Substance Use Topics  . Alcohol use: Yes    Alcohol/week: 3.0 standard drinks    Types: 3 Glasses of wine per week    Comment: social   . Drug use: Never    Home Medications Prior to Admission medications   Medication Sig Start Date End Date Taking? Authorizing Provider  lidocaine (XYLOCAINE) 2 % solution Use as directed 15 mLs  in the mouth or throat every 4 (four) hours as needed for mouth pain. 07/16/21  Yes Krisha Beegle, Corene Cornea, MD  sucralfate (CARAFATE) 1 g tablet Take 1 tablet (1 g total) by mouth 4 (four) times daily -  with meals and at bedtime. 07/16/21  Yes Luian Schumpert, Corene Cornea, MD  amLODipine (NORVASC) 5 MG tablet Take 1 tablet (5 mg total) by mouth daily. 02/27/20   Maryruth Hancock, MD  esomeprazole (NEXIUM) 40 MG packet Take 40 mg by mouth daily before breakfast. 07/08/21   Lindell Spar, MD  famotidine (PEPCID) 40 MG tablet Take 1 tablet (40 mg total) by mouth at bedtime as needed for heartburn or indigestion. 06/05/21   Rogene Houston, MD  methimazole (TAPAZOLE) 5 MG tablet Take 5 mg by mouth 2 (two) times daily.    [provider]  Multiple Vitamins-Minerals (MULTIVITAMIN WITH MINERALS) tablet Take 1 tablet by mouth daily. One a day woman     [provider]  Omega-3 Fatty Acids (FISH OIL) 1200 MG CAPS Take 2,400 mg by mouth daily.    [provider]  triamcinolone cream (KENALOG) 0.1 % Apply 1 application topically daily as needed (Eczema).    [provider]  lisinopril (ZESTRIL) 5 MG tablet Take by mouth. 04/14/19 09/21/19  [provider]    Allergies    Latex and Other  Review of Systems   Review of Systems  Constitutional:  Negative for fatigue and fever.  Cardiovascular:  Positive for chest pain.  Gastrointestinal:  Positive for heartburn and nausea. Negative for abdominal pain and vomiting.  Musculoskeletal:  Negative for back pain.  All other systems reviewed and are negative.  Physical Exam Updated Vital Signs BP 138/82   Pulse 80   Temp 98.2 F (36.8 C)   Resp 18   Ht '5\' 4"'$  (1.626 m)   Wt 95 kg   LMP 06/30/2021   SpO2 100%   BMI 35.95 kg/m   Physical Exam Vitals and nursing note reviewed.  Constitutional:      Appearance: She is well-developed.  HENT:     Head: Normocephalic and atraumatic.     Mouth/Throat:     Mouth: Mucous membranes are moist.     Pharynx: Oropharynx is clear.  Eyes:     Pupils: Pupils are equal, round, and reactive to light.  Cardiovascular:     Rate and Rhythm: Normal rate and regular rhythm.  Pulmonary:     Effort: No respiratory distress.     Breath sounds: No stridor.  Abdominal:     General: There is no distension.     Palpations: Abdomen is soft.  Musculoskeletal:        General: Normal range of motion.     Cervical back: Normal range of motion.  Skin:    General: Skin is warm and dry.  Neurological:     General: No focal deficit present.     Mental Status: She is alert.    ED Results / Procedures / Treatments   Labs (all labs ordered are listed, but only abnormal results are displayed) Labs Reviewed  CBC WITH DIFFERENTIAL/PLATELET - Abnormal; Notable for the following components:      Result Value   Hemoglobin 11.7  (*)    All other components within normal limits  COMPREHENSIVE METABOLIC PANEL - Abnormal; Notable for the following components:   Glucose, Bld 112 (*)    Calcium 8.1 (*)    Albumin 3.4 (*)    All other  components within normal limits  TROPONIN I (HIGH SENSITIVITY)    EKG EKG Interpretation  Date/Time:  Wednesday July 16 2021 02:41:26 EDT Ventricular Rate:  85 PR Interval:  202 QRS Duration: 79 QT Interval:  353 QTC Calculation: 420 R Axis:   64 Text Interpretation: Sinus rhythm Borderline prolonged PR interval Confirmed by Merrily Pew [phone]) on 07/16/2021 2:59:59 AM  Radiology No results found.  Procedures Procedures   Medications Ordered in ED Medications  lidocaine (XYLOCAINE) 2 % viscous mouth solution 15 mL (15 mLs Mouth/Throat Given 07/16/21 0334)  alum & mag hydroxide-simeth (MAALOX/MYLANTA) 200-200-20 MG/5ML suspension 30 mL (30 mLs Oral Given 07/16/21 0334)    ED Course  I have reviewed the triage vital signs and the nursing notes.  Pertinent labs & imaging results that were available during my care of the patient were reviewed by me and considered in my medical decision making (see chart for details).    MDM Rules/Calculators/A&P                           Most c/w recurrent heartburn. Improved with meds here. Already has GI follow up. Low suspicion for cardiac or pulmonary causes.   Final Clinical Impression(s) / ED Diagnoses Final diagnoses:  Nonspecific chest pain  Indigestion    Rx / DC Orders ED Discharge Orders          Ordered    lidocaine (XYLOCAINE) 2 % solution  Every 4 hours PRN        07/16/21 0516    sucralfate (CARAFATE) 1 g tablet  3 times daily with meals & bedtime        07/16/21 0516             Wiatt Mahabir, Corene Cornea, MD 07/16/21 [phone]

## 2021-07-18 ENCOUNTER — Other Ambulatory Visit: Payer: Self-pay

## 2021-07-18 ENCOUNTER — Ambulatory Visit: Payer: BC Managed Care – PPO | Admitting: Internal Medicine

## 2021-07-18 VITALS — BP 130/84 | HR 80 | Ht 64.0 in | Wt 209.0 lb

## 2021-07-18 DIAGNOSIS — E0590 Thyrotoxicosis, unspecified without thyrotoxic crisis or storm: Secondary | ICD-10-CM | POA: Diagnosis not present

## 2021-07-18 DIAGNOSIS — E042 Nontoxic multinodular goiter: Secondary | ICD-10-CM | POA: Diagnosis not present

## 2021-07-18 LAB — T4, FREE: Free T4: 0.69 ng/dL (ref 0.60–1.60)

## 2021-07-18 LAB — TSH: TSH: 1.91 u[IU]/mL (ref 0.35–5.50)

## 2021-07-18 MED ORDER — METHIMAZOLE 5 MG PO TABS: 5.0000 mg | ORAL_TABLET | Freq: Every day | ORAL | 2 refills | Status: DC

## 2021-07-18 NOTE — Progress Notes (Signed)
Name: Gina Coleman  MRN/ DOB: [id number], 01-25-1976    Age/ Sex: 45 y.o., female     PCP: Lindell Spar, MD   Reason for Endocrinology Evaluation: [place]     Initial Endocrinology Clinic Visit: 07/12/2019    PATIENT IDENTIFIER: Gina Coleman is a 45 y.o., female with a past medical history of HTN. She has followed with [facility] since 07/12/2019 for consultative assistance with management of her 07/12/2019.   HISTORICAL SUMMARY: The patient was first  noted to have a low TSH at 0.399 uIU/mL during an evaluation for an enlarged cervical lymph node in 05/2019. She was started on Abx at the time.  Repeat TFT's were normal in 07/2019 . Ultrasound showed MNG . She is S/P benign FNA of the RUP nodule 08/2019   TRAb was undetectable  Methimazole started 03/2020 due to symptoms of hyperthyroidism    No FH of thyroid disorder  SUBJECTIVE:    Today (07/18/2021):  Gina Coleman is here for a follow up on subclinical hyperthyroidism. S/P FNA of the RUP nodule with benign cytology in 08/2019   She has been noted with weight loss  Denies diarrhea or constipation  She has heartburn , takes nexium   She feels she always has something in her throat    She is on Methimazole 5 mg, once daily     HISTORY:  Past Medical History:  Past Medical History:  Diagnosis Date   Acid reflux    Hypertension    Hyperthyroidism    Past Surgical History:  Past Surgical History:  Procedure Laterality Date   BIOPSY  06/05/2021   Procedure: BIOPSY;  Surgeon: Rogene Houston, MD;  [facility]: [facility];  Service: Endoscopy;;   COLONOSCOPY N/A 06/05/2021   rehman:The examined portion of the ileum was normal. one 7m polyp in cecum (tubular adenoma). external hemorrhoids   ESOPHAGOGASTRODUODENOSCOPY N/A 06/05/2021   Rehman:Normal hypopharynx.normal esophagus, z line irregular 36 cm from incisors, gastritics with reactive gastropathy, normal duodenal bulb and second portion of  duodenum   FOOT SURGERY Right    LEG SURGERY Right    TUBAL LIGATION     Social History:  reports that she quit smoking 9 days ago. Her smoking use included cigarettes. She has never used smokeless tobacco. She reports current alcohol use of about 3.0 standard drinks per week. She reports that she does not use drugs. Family History:  Family History  Problem Relation Age of Onset   CAD Mother        Premature disease   Hypertension Sister    Breast cancer Maternal Grandmother    HIV Sister    Asthma Son      HOME MEDICATIONS: Allergies as of 07/18/2021       Reactions   Latex Hives   Other Rash   GLOVE POWDER         Medication List        Accurate as of July 18, 2021  1:42 PM. If you have any questions, ask your nurse or doctor.          amLODipine 5 MG tablet Commonly known as: NORVASC Take 1 tablet (5 mg total) by mouth daily.   esomeprazole 40 MG packet Commonly known as: NexIUM Take 40 mg by mouth daily before breakfast.   famotidine 40 MG tablet Commonly known as: PEPCID Take 1 tablet (40 mg total) by mouth at bedtime as needed for heartburn or indigestion.   Fish Oil 1200  MG Caps Take 2,400 mg by mouth daily.   lidocaine 2 % solution Commonly known as: XYLOCAINE Use as directed 15 mLs in the mouth or throat every 4 (four) hours as needed for mouth pain.   methimazole 5 MG tablet Commonly known as: TAPAZOLE Take 5 mg by mouth 2 (two) times daily.   multivitamin with minerals tablet Take 1 tablet by mouth daily. One a day woman   sucralfate 1 g tablet Commonly known as: Carafate Take 1 tablet (1 g total) by mouth 4 (four) times daily -  with meals and at bedtime.   triamcinolone cream 0.1 % Commonly known as: KENALOG Apply 1 application topically daily as needed (Eczema).          OBJECTIVE:   PHYSICAL EXAM: VS: BP 130/84   Pulse 80   Ht '5\' 4"'$  (1.626 m)   Wt 209 lb (94.8 kg)   LMP 06/30/2021   SpO2 98%   BMI 35.87 kg/m     EXAM: General: Pt appears well and is in NAD  Neck: General: Supple without adenopathy. Thyroid: Right thyroid  nodule appreciated.   Lungs: Clear with good BS bilat with no rales, rhonchi, or wheezes  Heart: Auscultation: RRR.  Abdomen: Normoactive bowel sounds, soft, nontender, without masses or organomegaly palpable  Extremities:  BL LE: No pretibial edema normal ROM and strength.  Mental Status: Judgment, insight: Intact Orientation: Oriented to time, place, and person Mood and affect: No depression, anxiety, or agitation     DATA REVIEWED:  Results for Gina Coleman, Gina Coleman (MRN [id number]) as of 07/20/2021 12:57  Ref. Range 07/18/2021 14:07  TSH Latest Ref Range: 0.35 - 5.50 uIU/mL 1.91  T4,Free(Direct) Latest Ref Range: 0.60 - 1.60 ng/dL 0.69    Results for Gina Coleman, Gina Coleman (MRN [id number]) as of 07/18/2021 13:46  Ref. Range 07/16/2021 03:33  Sodium Latest Ref Range: 135 - 145 mmol/L 140  Potassium Latest Ref Range: 3.5 - 5.1 mmol/L 3.7  Chloride Latest Ref Range: 98 - 111 mmol/L 104  CO2 Latest Ref Range: 22 - 32 mmol/L 28  Glucose Latest Ref Range: 70 - 99 mg/dL 112 (H)  BUN Latest Ref Range: 6 - 20 mg/dL 13  Creatinine Latest Ref Range: 0.44 - 1.00 mg/dL 0.66  Calcium Latest Ref Range: 8.9 - 10.3 mg/dL 8.1 (L)  Anion gap Latest Ref Range: 5 - 15  8  Alkaline Phosphatase Latest Ref Range: 38 - 126 U/L 99  Albumin Latest Ref Range: 3.5 - 5.0 g/dL 3.4 (L)  AST Latest Ref Range: 15 - 41 U/L 15  ALT Latest Ref Range: 0 - 44 U/L 21  Total Protein Latest Ref Range: 6.5 - 8.1 g/dL 6.9  Total Bilirubin Latest Ref Range: 0.3 - 1.2 mg/dL 0.4  GFR, Estimated Latest Ref Range: >60 mL/min >60  Troponin I (High Sensitivity) Latest Ref Range: <18 ng/L <2  WBC Latest Ref Range: 4.0 - 10.5 K/uL 6.4  RBC Latest Ref Range: 3.87 - 5.11 MIL/uL 4.07  Hemoglobin Latest Ref Range: 12.0 - 15.0 g/dL 11.7 (L)  HCT Latest Ref Range: 36.0 - 46.0 % 36.7  MCV Latest Ref Range: 80.0 - 100.0 fL  90.2  MCH Latest Ref Range: 26.0 - 34.0 pg 28.7  MCHC Latest Ref Range: 30.0 - 36.0 g/dL 31.9  RDW Latest Ref Range: 11.5 - 15.5 % 13.2  Platelets Latest Ref Range: 150 - 400 K/uL 230  nRBC Latest Ref Range: 0.0 - 0.2 % 0.0  Neutrophils Latest Units: % 53  Lymphocytes Latest Units: % 32  Monocytes Relative Latest Units: % 10  Eosinophil Latest Units: % 5  Basophil Latest Units: % 0  Immature Granulocytes Latest Units: % 0  NEUT# Latest Ref Range: 1.7 - 7.7 K/uL 3.4  Lymphocyte # Latest Ref Range: 0.7 - 4.0 K/uL 2.0  Monocyte # Latest Ref Range: 0.1 - 1.0 K/uL 0.6  Eosinophils Absolute Latest Ref Range: 0.0 - 0.5 K/uL 0.3  Basophils Absolute Latest Ref Range: 0.0 - 0.1 K/uL 0.0  Abs Immature Granulocytes Latest Ref Range: 0.00 - 0.07 K/uL 0.01      Thyroid Ultrasound 12/24/2020     FINDINGS: Parenchymal Echotexture: Mildly heterogenous   Isthmus: 0.7 cm   Right lobe: 6.7 x 2.7 x 3.9 cm   Left lobe: 5.8 x 2.3 x 3.0 cm   _________________________________________________________   Estimated total number of nodules >/= 1 cm: 3   Number of spongiform nodules >/=  2 cm not described below (TR1): 0   Number of mixed cystic and solid nodules >/= 1.5 cm not described below (TR2): 0   _________________________________________________________   Nodule # 1:   Prior biopsy: No   Location: Isthmus; Inferior   Maximum size: 1.9 cm; Other 2 dimensions: 1.7 x 1.2 cm, previously, 1.9 cm on the 06/27/2019 study   Composition: solid/almost completely solid (2)   Echogenicity: isoechoic (1)   Shape: not taller-than-wide (0)   Margins: ill-defined (0)   Echogenic foci: none (0)   ACR TI-RADS total points: 3.   ACR TI-RADS risk category:  TR3 (3 points).   Significant change in size (>/= 20% in two dimensions and minimal increase of 2 mm): No   Change in features: No   Change in ACR TI-RADS risk category: No   ACR TI-RADS recommendations:   *Given size (>/= 1.5 -  2.4 cm) and appearance, a follow-up ultrasound in 1 year should be considered based on TI-RADS criteria. This nodule was measured slightly differently on the most recent study in September of last year but is clearly stable compared to the 2020 measurements. This nodule has been stable for a year and a half.   _________________________________________________________   Previously sampled 3.9 cm nodule in the superior aspect of the right lobe is stable in size and appearance with prior fine-needle aspiration demonstrating benign cytology.   Other smaller right inferior and isthmus nodules are stable compared to the 2020 ultrasound. None of these nodules meets criteria for fine-needle aspiration.   Stable small left submandibular lymph node measures 0.8 cm in short axis.   IMPRESSION: 1. Stable 1.9 cm isthmus nodule which meets criteria for additional 1 year follow-up ultrasound. This nodule has been stable for a year and a half. 2. Stable previously sample 3.9 cm right thyroid nodule. Prior cytology was benign. 3. Additional stable small right inferior and isthmus nodules which do not meet criteria for fine-needle aspiration. 4. Stable nonenlarged small left submandibular lymph node.     FNA (08/02/2019)   THYROID, FINE NEEDLE ASPIRATION, RUP (SPECIMEN 1 OF 1, COLLECTED 08/02/19): CONSISTENT WITH BENIGN FOLLICULAR NODULE (BETHESDA CATEGORY II).  ASSESSMENT / PLAN / RECOMMENDATIONS:   Multinodular Goiter :   - No local neck symptoms  - S/P Benign FNA of the Right upper nodule 08/2019 - A repeat thyroid ultrasound shows stability 11/2020    2. Hyperthyroidism :   -Pt is clinically and biochemically euthyroid  - She was treated because she was symptomatic  - Most likely secondary to autonomous nodule  -  TFT's normal, No changes today    Medication: Methimazole 5 mg 1 tablet daily      F/U in 6 months     Signed electronically by: Mack Guise,  MD  [facility]  [address] [place], [place] [address] Phone: [phone] FAX: [phone]      CC: Lindell Spar, MD [address] [place] [address] Phone: [phone]  Fax: [phone]   Return to Endocrinology clinic as below: Future Appointments  Date Time Provider [place]  08/06/2021  3:50 PM Estill Dooms, NP [facility]  09/02/2021  3:30 PM Harvel Quale, MD NRE-NRE None  10/07/2021  1:40 PM Lindell Spar, MD [facility]RPC [facility]  12/16/2021  3:00 PM Rehman, Mechele Dawley, MD NRE-NRE None

## 2021-07-20 ENCOUNTER — Encounter: Payer: Self-pay | Admitting: Internal Medicine

## 2021-07-20 MED ORDER — METHIMAZOLE 5 MG PO TABS: 5.0000 mg | ORAL_TABLET | Freq: Every day | ORAL | 2 refills | Status: DC

## 2021-07-26 ENCOUNTER — Encounter: Payer: Self-pay | Admitting: Internal Medicine

## 2021-08-06 ENCOUNTER — Ambulatory Visit: Payer: BC Managed Care – PPO | Admitting: Adult Health

## 2021-08-11 ENCOUNTER — Other Ambulatory Visit: Payer: Self-pay | Admitting: Family Medicine

## 2021-08-11 DIAGNOSIS — K219 Gastro-esophageal reflux disease without esophagitis: Secondary | ICD-10-CM

## 2021-08-14 ENCOUNTER — Ambulatory Visit: Payer: BC Managed Care – PPO | Admitting: Internal Medicine

## 2021-09-02 ENCOUNTER — Ambulatory Visit (INDEPENDENT_AMBULATORY_CARE_PROVIDER_SITE_OTHER): Payer: BC Managed Care – PPO | Admitting: Gastroenterology

## 2021-09-04 ENCOUNTER — Ambulatory Visit (INDEPENDENT_AMBULATORY_CARE_PROVIDER_SITE_OTHER): Payer: BC Managed Care – PPO | Admitting: Gastroenterology

## 2021-09-04 ENCOUNTER — Other Ambulatory Visit: Payer: Self-pay

## 2021-09-04 ENCOUNTER — Encounter (INDEPENDENT_AMBULATORY_CARE_PROVIDER_SITE_OTHER): Payer: Self-pay | Admitting: Gastroenterology

## 2021-09-04 VITALS — BP 137/84 | HR 79 | Temp 98.7°F | Ht 64.0 in | Wt 206.0 lb

## 2021-09-04 DIAGNOSIS — K219 Gastro-esophageal reflux disease without esophagitis: Secondary | ICD-10-CM | POA: Diagnosis not present

## 2021-09-04 MED ORDER — FAMOTIDINE 40 MG PO TABS: 40.0000 mg | ORAL_TABLET | Freq: Every day | ORAL | 3 refills | Status: DC

## 2021-09-04 MED ORDER — ESOMEPRAZOLE MAGNESIUM 40 MG PO PACK: 40.0000 mg | PACK | Freq: Two times a day (BID) | ORAL | 5 refills | Status: DC

## 2021-09-04 NOTE — Patient Instructions (Signed)
Continue nexium 40mg  twice a day Continue pepcid 40mg  in the evening, you can take a second dose as needed if you are having more acid regurgitation.  Please continue to stay sitting upright 2-3 hours after eating and avoid bending over for at least 30 minutes after eating.  Avoid NSAIDs (ibuprofen, aleve, naproxen, good powder, advil and high dose aspirin) as these can worsen your symptoms.  Consume alcohol, caffeine, spicy, greasy foods and chocolate in moderation as these can also worsen reflux. Exercise and weight loss are helpful in the management of reflux if you are able to incorporate some exercise into your daily routine, any little bit helps!  I'm so glad you are doing better, we will plan to see you in 6 months, please call sooner if you develop new or worsening symptoms!

## 2021-09-04 NOTE — Progress Notes (Signed)
Referring Provider: Lindell Spar, MD Primary Care Physician:  Lindell Spar, MD Primary GI Physician: Jenetta Downer  Chief Complaint  Patient presents with   Follow-up    6 week follow up. Feels like a frog is in throat and when bending over has to vomit.    HPI:   Gina Coleman is a 45 y.o. female with past medical history of GERD, HTN, and hyperthyroidism.  Patient presenting today for 6 week follow up of GERD/suspected reflux esophagitis, last clinic visit 07/14/10 after she presented to ED for c/o L sided chest pain as she was concerned should be having a cardiac event.  GERD/reflux esophagitis: nexium 40mg  increased to BID with pepcid 40mg  nightly at last visit in August. She subsequently had another episode of L sided chest pain that she went to the ER for 2 days after her GI clinic visit. Cardiac etiology was ruled out again, she received some carafate and lidocaine in the ER that helped her acute episode of chest pain and she was discharged.   She reports that reflux symptoms have improved greatly since. She feels that nexium BID with pepcid in the evenings is controlling her symptoms very well.   She states she still has some feelings of thickness in her throat and will have acid regurgitation at times if she bends over too fast after eating, however, she is not having any heartburn and she has not had any more episodes of chest pain/discomfort. She denies dysphagia or odynophagia, no sore throat, cough or hoarse voice. She states she has cut out spicy foods, coffee, alcohol and smoking and feels these implementations have also helped. Patient has lost 3 pounds since her visit in august due to changes in her diet.  No red flag symptoms. Patient denies melena, hematochezia, nausea, vomiting, diarrhea, constipation, dysphagia, odyonophagia, early satiety or unintentional weight loss.   Last Colonoscopy:(06/05/21) examined portion of ileum was normal. One 4 mm polyp in cecum  (tubular adenoma), external hemorrhoids Last Endoscopy:(06/05/21) normal hypopharynx, normal esophagus, z line irregular 36 cm from incisors, gastritis (reactive gastropathy), normal duodenal bulb and second portion of duodenum  Recommendations:  Repeat surveillance colonoscopy in 2027  Past Medical History:  Diagnosis Date   Acid reflux    Hypertension    Hyperthyroidism     Past Surgical History:  Procedure Laterality Date   BIOPSY  06/05/2021   Procedure: BIOPSY;  Surgeon: Rogene Houston, MD;  [facility]: [facility];  Service: Endoscopy;;   COLONOSCOPY N/A 06/05/2021   rehman:The examined portion of the ileum was normal. one 38mm polyp in cecum (tubular adenoma). external hemorrhoids   ESOPHAGOGASTRODUODENOSCOPY N/A 06/05/2021   Rehman:Normal hypopharynx.normal esophagus, z line irregular 36 cm from incisors, gastritics with reactive gastropathy, normal duodenal bulb and second portion of duodenum   FOOT SURGERY Right    LEG SURGERY Right    TUBAL LIGATION      Current Outpatient Medications  Medication Sig Dispense Refill   amLODipine (NORVASC) 5 MG tablet Take 1 tablet (5 mg total) by mouth daily. 90 tablet 1   esomeprazole (NEXIUM) 40 MG packet Take 40 mg by mouth daily before breakfast. 30 each 5   famotidine (PEPCID) 40 MG tablet TAKE 1 TABLET(40 MG) BY MOUTH DAILY 90 tablet 0   methimazole (TAPAZOLE) 5 MG tablet Take 1 tablet (5 mg total) by mouth daily. 90 tablet 2   Multiple Vitamins-Minerals (MULTIVITAMIN WITH MINERALS) tablet Take 1 tablet by mouth daily. One a day woman  Omega-3 Fatty Acids (FISH OIL) 1200 MG CAPS Take 2,400 mg by mouth daily.     triamcinolone cream (KENALOG) 0.1 % Apply 1 application topically daily as needed (Eczema).     No current facility-administered medications for this visit.    Allergies as of 09/04/2021 - Review Complete 09/04/2021  Allergen Reaction Noted   Latex Hives 07/05/2019   Other Rash 09/27/2011    Family History   Problem Relation Age of Onset   CAD Mother        Premature disease   Hypertension Sister    Breast cancer Maternal Grandmother    HIV Sister    Asthma Son     Social History   Socioeconomic History   Marital status: Married    Spouse name: Not on file   Number of children: Not on file   Years of education: Not on file   Highest education level: Not on file  Occupational History   Not on file  Tobacco Use   Smoking status: Some Days    Types: Cigarettes    Last attempt to quit: 07/09/2021    Years since quitting: 0.1   Smokeless tobacco: Never  Vaping Use   Vaping Use: Never used  Substance and Sexual Activity   Alcohol use: Yes    Alcohol/week: 3.0 standard drinks    Types: 3 Glasses of wine per week    Comment: social    Drug use: Never   Sexual activity: Yes    Birth control/protection: Surgical    Comment: tubal  Other Topics Concern   Not on file  Social History Narrative   Not on file   Social Determinants of Health   Financial Resource Strain: Low Risk    Difficulty of Paying Living Expenses: Not hard at all  Food Insecurity: No Food Insecurity   Worried About Charity fundraiser in the Last Year: Never true   [place] in the Last Year: Never true  Transportation Needs: No Transportation Needs   Lack of Transportation (Medical): No   Lack of Transportation (Non-Medical): No  Physical Activity: Inactive   Days of Exercise per Week: 0 days   Minutes of Exercise per Session: 0 min  Stress: No Stress Concern Present   Feeling of Stress : Only a little  Social Connections: Moderately Isolated   Frequency of Communication with Friends and Family: More than three times a week   Frequency of Social Gatherings with Friends and Family: Once a week   Attends Religious Services: Never   Marine scientist or Organizations: No   Attends Music therapist: Never   Marital Status: Married   Review of Systems: Gen: Denies fever, chills,  anorexia. Denies fatigue, weakness, weight loss.  CV: Denies chest pain, palpitations, syncope, peripheral edema, and claudication. Resp: Denies dyspnea at rest, cough, wheezing, coughing up blood, and pleurisy. GI: Denies melena, hematochezia, nausea, vomiting, diarrhea, constipation, dysphagia, odyonophagia, early satiety or weight loss. +occasional acid regurgitation +thickness in throat Derm: Denies rash, itching, dry skin Psych: Denies depression, anxiety, memory loss, confusion. No homicidal or suicidal ideation.  Heme: Denies bruising, bleeding, and enlarged lymph nodes.  Physical Exam: BP 137/84 (BP Location: Right Arm, Patient Position: Sitting, Cuff Size: Large)   Pulse 79   Temp 98.7 F (37.1 C) (Oral)   Ht 5\' 4"  (1.626 m)   Wt 206 lb (93.4 kg)   BMI 35.36 kg/m  General:   Alert and oriented. No distress  noted. Pleasant and cooperative.  Head:  Normocephalic and atraumatic. Eyes:  Conjuctiva clear without scleral icterus. Mouth:  Oral mucosa pink and moist. Good dentition. No lesions. Heart: Normal rate and rhythm, s1 and s2 heart sounds present.  Lungs: Clear lung sounds in all lobes. Respirations equal and unlabored. Abdomen:  +BS, soft, non-tender and non-distended. No rebound or guarding. No HSM or masses noted. Derm: No palmar erythema or jaundice Msk:  Symmetrical without gross deformities. Normal posture. Extremities:  Without edema. Neurologic:  Alert and  oriented x4 Psych:  Alert and cooperative. Normal mood and affect.  Invalid input(s): 6 MONTHS   ASSESSMENT: Gina Coleman is a 45 y.o. female presenting today for 6 week follow up of GERD/suspected reflux esophagitis.  Presenting today for 6 week follow up after being seen in the clinic in August for recurrent episodes of L sided chest pain that was burning and sharp in nature. She had 2 ER visits due to concerns of cardiac event, which was ruled out both times. At last visit her nexium was increased to  40mg  BID with Pepcid 40mg  in the evenings. She has cut out alcohol, tobacco, spicy foods, and coffee and reports a great improvement in her symptoms. No further episodes of chest pain since last ER visit on 8/17 where she received carafate and lidocaine. She denies any recurrent reflux. Endorses occasional acid regurgitation if she bends over to soon after eating and still some thickness in her throat. She has also lost about 3 pounds since last visit, which is due in part to her changes in diet.   I suspect she had an acute episode of reflux esophagitis due to some increased alcohol/caffeine/spicy food intake prior to her last visit. We will continue current PPI dosing with H2B in the evenings. I advised her that dietary changes and exercise/weight loss are also very helpful in the management of reflux. She should continue to use alcohol, caffeine, chocolate, spicy foods in moderation and avoid NSAIDs as well.  No red flag symptoms. Patient denies melena, hematochezia, nausea, vomiting, diarrhea, constipation, dysphagia, odyonophagia, early satiety or weight loss.    PLAN:  Nexium 40mg  BID 2. Pepcid 40mg  daily in the evening 3. alcohol, spicy foods, caffeine, chocolate in moderation, avoid NSAIDs 4. Stay upright 2-3 hours after eating, avoid bending over atleast 30 minutes after eating 5. Can consider backing down on PPI dosing at next visit if symptoms remain well controlled  Follow Up: 6 months  Melenda Bielak L. Alver Sorrow, MSN, APRN, AGNP-C Adult-Gerontology Nurse Practitioner [facility]

## 2021-09-15 ENCOUNTER — Telehealth (INDEPENDENT_AMBULATORY_CARE_PROVIDER_SITE_OTHER): Payer: Self-pay | Admitting: *Deleted

## 2021-09-15 NOTE — Telephone Encounter (Signed)
Fax from [facility]. Esomeprazole gra 40mg  dr - 4 packets per day is approved for 1 year, through 09/10/22. Pt called and notified and letter of approval faxed to pharm.

## 2021-10-02 ENCOUNTER — Other Ambulatory Visit: Payer: Self-pay

## 2021-10-02 ENCOUNTER — Ambulatory Visit: Payer: BC Managed Care – PPO | Admitting: Adult Health

## 2021-10-02 ENCOUNTER — Encounter: Payer: Self-pay | Admitting: Adult Health

## 2021-10-02 VITALS — BP 149/92 | HR 96 | Ht 64.0 in | Wt 208.8 lb

## 2021-10-02 DIAGNOSIS — N6321 Unspecified lump in the left breast, upper outer quadrant: Secondary | ICD-10-CM | POA: Insufficient documentation

## 2021-10-02 DIAGNOSIS — Z842 Family history of other diseases of the genitourinary system: Secondary | ICD-10-CM | POA: Insufficient documentation

## 2021-10-02 DIAGNOSIS — N644 Mastodynia: Secondary | ICD-10-CM

## 2021-10-02 DIAGNOSIS — N6002 Solitary cyst of left breast: Secondary | ICD-10-CM | POA: Insufficient documentation

## 2021-10-02 MED ORDER — VITAMIN B-6 25 MG PO TABS: ORAL_TABLET | ORAL | Status: DC

## 2021-10-02 NOTE — Progress Notes (Signed)
  Subjective:     Patient ID: Gina Coleman, female   DOB: 03-23-1976, 45 y.o.   MRN: [id number]  HPI Gina Coleman is a 45 year old black female,married, [phone] in complaining of pain in left breast for about  2 weeks. PCP is Dr Posey Pronto. Lab Results  Component Value Date   DIAGPAP  06/14/2020    - Negative for intraepithelial lesion or malignancy (NILM)   [place] Negative 06/14/2020     Review of Systems Pain in left breast for about 2 weeks Reviewed past medical,surgical, social and family history. Reviewed medications and allergies.     Objective:   Physical Exam BP (!) 149/92 (BP Location: Right Arm, Patient Position: Sitting, Cuff Size: Normal)   Pulse 96   Ht 5\' 4"  (1.626 m)   Wt 208 lb 12.8 oz (94.7 kg)   LMP 09/05/2021 (Exact Date)   BMI 35.84 kg/m      Skin warm and dry,  Breasts:no dominate palpable mass, retraction or nipple discharge on the right, on the left, no nipple discharge, nipple is pulling in, she says that is not new, has pain UOQ and mass at 2 0' clock, 4 FB from areola, no redness or temperature change in skin, she had mammogram in July 2022 Targeted ultrasound of the left breast was performed. The bilobed cyst versus 2 adjacent cysts in the left breast at the 11:30 position 4 cm from nipple measures 0.7 x 0.4 x 0.4 cm, unchanged. The oval circumscribed hypoechoic mass in the left breast at 1 o'clock 4 cm from nipple measures 0.5 x 0.3 x 0.5 cm, smaller likely due to being imaged differently when compared to prior ultrasounds. The oval circumscribed hypoechoic mass in the retroareolar left breast measures 0.7 x 0.5 x 0.7 cm, unchanged.  -fall risk is low  [facility] - 10/02/21 0923       Pregnancy Intention Screening   Does the patient want to become pregnant in the next year? N/A    Does the patient's partner want to become pregnant in the next year? N/A    Would the patient like to discuss contraceptive options today? N/A      Contraception Wrap Up    Current Method Female Sterilization    End Method Female Sterilization    Contraception Counseling Provided No             Assessment:     1. Breast pain, left Scheduled diagnostic mammogram and [place] at [facility] for 11/18/21 at 10:40 am Try B6 25 mg 1 tid, it is OTC Can use tylenol or Advil and cool compresses for pain relief She only has tea in am,no other caffeine drinks  - MM DIAG BREAST TOMO UNI LEFT; Future - US BREAST LTD UNI LEFT INC AXILLA; Future  2. Mass of upper outer quadrant of left breast  - MM DIAG BREAST TOMO UNI LEFT; Future - US BREAST LTD UNI LEFT INC AXILLA; Future     3. History  of left breast cysts   Plan:     Follow up prn

## 2021-10-07 ENCOUNTER — Ambulatory Visit: Payer: BC Managed Care – PPO | Admitting: Internal Medicine

## 2021-10-09 ENCOUNTER — Encounter: Payer: Self-pay | Admitting: Internal Medicine

## 2021-10-09 ENCOUNTER — Other Ambulatory Visit: Payer: Self-pay

## 2021-10-09 ENCOUNTER — Ambulatory Visit: Payer: BC Managed Care – PPO | Admitting: Internal Medicine

## 2021-10-09 VITALS — BP 159/103 | HR 87 | Ht 64.0 in | Wt 210.0 lb

## 2021-10-09 DIAGNOSIS — I10 Essential (primary) hypertension: Secondary | ICD-10-CM

## 2021-10-09 DIAGNOSIS — E042 Nontoxic multinodular goiter: Secondary | ICD-10-CM

## 2021-10-09 DIAGNOSIS — K219 Gastro-esophageal reflux disease without esophagitis: Secondary | ICD-10-CM

## 2021-10-09 DIAGNOSIS — M5440 Lumbago with sciatica, unspecified side: Secondary | ICD-10-CM

## 2021-10-09 DIAGNOSIS — G8929 Other chronic pain: Secondary | ICD-10-CM

## 2021-10-09 DIAGNOSIS — Z2821 Immunization not carried out because of patient refusal: Secondary | ICD-10-CM

## 2021-10-09 MED ORDER — AMLODIPINE BESYLATE 5 MG PO TABS: 5.0000 mg | ORAL_TABLET | Freq: Every day | ORAL | 1 refills | Status: DC

## 2021-10-09 MED ORDER — CYCLOBENZAPRINE HCL 5 MG PO TABS: 5.0000 mg | ORAL_TABLET | Freq: Three times a day (TID) | ORAL | 1 refills | Status: DC | PRN

## 2021-10-09 NOTE — Assessment & Plan Note (Addendum)
Chronic low back pain with radiation to right LE No recent injury or heavy lifting We will check x-ray of the lumbar spine Flexeril as needed for back muscle spasms Simple back exercises advised Avoid heavy lifting and frequent bending Tylenol as needed Back brace and/or heating pad as needed

## 2021-10-09 NOTE — Assessment & Plan Note (Signed)
On Methimazole 5 mg BID Follows Endocrinology Last TSH and T4 wnl 

## 2021-10-09 NOTE — Patient Instructions (Signed)
Please start taking Amlodipine as prescribed.  Please follow low salt diet and perform moderate exercises/walking at least 150 mins/week.  Please perform simple back exercises as mentioned in attachment.  Please take Flexeril for back muscle spasms. Okay to take Tylenol for back pain. Avoid Ibuprofen or Aleve. Okay to use heating pad and/or back brace. Avoid heavy lifting and frequent bending.

## 2021-10-09 NOTE — Assessment & Plan Note (Signed)
On Nexium 40 mg twice daily Advised to use Nexium once daily and use other dose only as needed

## 2021-10-09 NOTE — Progress Notes (Addendum)
Established Patient Office Visit  Subjective:  Patient ID: Gina Coleman, female    DOB: 1976-11-05  Age: 45 y.o. MRN: [id number]  CC:  Chief Complaint  Patient presents with   Follow-up    6 month f/u. Would like to discuss if she needs to continue on Amlodipine. Has been having lower back pain and right leg pain/numbness. Symptoms started 1 month ago. Has been using OTC Biofreeze which helps.    HPI Gina Coleman is a 45 y.o. female with past medical history of hypertension and subclinical hyperthyroidism who presents for follow up of her chronic medical conditions.   HTN: BP is uncontrolled.  She has run out of her amlodipine.  She does have mild headache, but she denies dizziness, chest pain, dyspnea or palpitations.   Hyperthyroidism: On Methimazole.  Followed by endocrinology.  She has been taking Nexium twice daily for GERD.  Her acid reflux symptoms are better now.  Denies any dysphagia or odynophagia.  She complains of chronic low back pain, which is intermittent, worse with movement and radiating to right buttock and LE.  She has intermittent numbness of the right LE, but denies any weakness currently.  Denies any saddle anesthesia or urinary or stool incontinence.  Past Medical History:  Diagnosis Date   Acid reflux    Hypertension    Hyperthyroidism     Past Surgical History:  Procedure Laterality Date   BIOPSY  06/05/2021   Procedure: BIOPSY;  Surgeon: Rogene Houston, MD;  Location: [facility];  Service: Endoscopy;;   COLONOSCOPY N/A 06/05/2021   rehman:The examined portion of the ileum was normal. one 83mm polyp in cecum (tubular adenoma). external hemorrhoids   ESOPHAGOGASTRODUODENOSCOPY N/A 06/05/2021   Rehman:Normal hypopharynx.normal esophagus, z line irregular 36 cm from incisors, gastritics with reactive gastropathy, normal duodenal bulb and second portion of duodenum   FOOT SURGERY Right    LEG SURGERY Right    TUBAL LIGATION      Family  History  Problem Relation Age of Onset   CAD Mother        Premature disease   Hypertension Sister    Breast cancer Maternal Grandmother    HIV Sister    Asthma Son     Social History   Socioeconomic History   Marital status: Married    Spouse name: Not on file   Number of children: Not on file   Years of education: Not on file   Highest education level: Not on file  Occupational History   Not on file  Tobacco Use   Smoking status: Every Day    Types: Cigarettes   Smokeless tobacco: Never  Vaping Use   Vaping Use: Never used  Substance and Sexual Activity   Alcohol use: Yes    Alcohol/week: 3.0 standard drinks    Types: 3 Glasses of wine per week    Comment: social    Drug use: Never   Sexual activity: Yes    Birth control/protection: Surgical    Comment: tubal  Other Topics Concern   Not on file  Social History Narrative   Not on file   Social Determinants of Health   Financial Resource Strain: Low Risk    Difficulty of Paying Living Expenses: Not hard at all  Food Insecurity: No Food Insecurity   Worried About Charity fundraiser in the Last Year: Never true   [place] in the Last Year: Never true  Transportation Needs: No Transportation Needs  Lack of Transportation (Medical): No   Lack of Transportation (Non-Medical): No  Physical Activity: Inactive   Days of Exercise per Week: 0 days   Minutes of Exercise per Session: 0 min  Stress: No Stress Concern Present   Feeling of Stress : Only a little  Social Connections: Moderately Isolated   Frequency of Communication with Friends and Family: More than three times a week   Frequency of Social Gatherings with Friends and Family: Once a week   Attends Religious Services: Never   Marine scientist or Organizations: No   Attends Music therapist: Never   Marital Status: Married  Human resources officer Violence: Not At Risk   Fear of Current or Ex-Partner: No   Emotionally Abused: No    Physically Abused: No   Sexually Abused: No    Outpatient Medications Prior to Visit  Medication Sig Dispense Refill   esomeprazole (NEXIUM) 40 MG packet Take 40 mg by mouth 2 (two) times daily. 120 each 5   methimazole (TAPAZOLE) 5 MG tablet Take 1 tablet (5 mg total) by mouth daily. 90 tablet 2   Multiple Vitamins-Minerals (MULTIVITAMIN WITH MINERALS) tablet Take 1 tablet by mouth daily. One a day woman     Omega-3 Fatty Acids (FISH OIL) 1200 MG CAPS Take 2,400 mg by mouth daily.     triamcinolone cream (KENALOG) 0.1 % Apply 1 application topically daily as needed (Eczema).     vitamin B-6 (PYRIDOXINE) 25 MG tablet Take 1 tid     amLODipine (NORVASC) 5 MG tablet Take 1 tablet (5 mg total) by mouth daily. 90 tablet 1   diclofenac (VOLTAREN) 75 MG EC tablet Take 75 mg by mouth 2 (two) times daily.     famotidine (PEPCID) 40 MG tablet Take 1 tablet (40 mg total) by mouth daily. In the evening (Patient not taking: Reported on 10/09/2021) 90 tablet 3   No facility-administered medications prior to visit.    Allergies  Allergen Reactions   Latex Hives   Other Rash     GLOVE POWDER      ROS Review of Systems  Constitutional:  Negative for chills and fever.  HENT:  Negative for congestion, sinus pressure, sinus pain and sore throat.   Eyes:  Negative for pain and discharge.  Respiratory:  Negative for cough and shortness of breath.   Cardiovascular:  Negative for chest pain and palpitations.  Gastrointestinal:  Positive for abdominal pain (epigastric). Negative for diarrhea, nausea and vomiting.  Endocrine: Negative for polydipsia and polyuria.  Genitourinary:  Negative for dysuria and hematuria.  Musculoskeletal:  Positive for back pain. Negative for neck pain and neck stiffness.  Skin:  Negative for rash.  Neurological:  Negative for dizziness and weakness.  Psychiatric/Behavioral:  Negative for agitation and behavioral problems.      Objective:    Physical Exam Vitals  reviewed.  Constitutional:      General: She is not in acute distress.    Appearance: She is obese. She is not diaphoretic.  HENT:     Head: Normocephalic and atraumatic.     Nose: Nose normal. No congestion.     Mouth/Throat:     Mouth: Mucous membranes are moist.     Pharynx: No posterior oropharyngeal erythema.  Eyes:     General: No scleral icterus.    Extraocular Movements: Extraocular movements intact.  Neck:     Thyroid: Thyromegaly present. No thyroid tenderness.  Cardiovascular:     Rate and Rhythm:  Normal rate and regular rhythm.     Pulses: Normal pulses.     Heart sounds: Normal heart sounds. No murmur heard.   No friction rub. No gallop.  Pulmonary:     Breath sounds: Normal breath sounds. No wheezing or rales.  Abdominal:     Palpations: Abdomen is soft.     Tenderness: There is no abdominal tenderness.  Musculoskeletal:        General: Tenderness (Lumbar spine area) present.     Cervical back: Normal range of motion. No rigidity.     Right lower leg: No edema.     Left lower leg: No edema.  Skin:    General: Skin is warm.     Findings: No rash.  Neurological:     General: No focal deficit present.     Mental Status: She is alert and oriented to person, place, and time.     Sensory: No sensory deficit.     Motor: No weakness.  Psychiatric:        Mood and Affect: Mood normal.        Behavior: Behavior normal.    BP (!) 159/103 (BP Location: Left Arm, Patient Position: Sitting, Cuff Size: Normal)   Pulse 87   Ht 5\' 4"  (1.626 m)   Wt 210 lb (95.3 kg)   SpO2 98%   BMI 36.05 kg/m  Wt Readings from Last 3 Encounters:  10/09/21 210 lb (95.3 kg)  10/02/21 208 lb 12.8 oz (94.7 kg)  09/04/21 206 lb (93.4 kg)    Lab Results  Component Value Date   TSH 1.91 07/18/2021   Lab Results  Component Value Date   WBC 6.4 07/16/2021   HGB 11.7 (L) 07/16/2021   HCT 36.7 07/16/2021   MCV 90.2 07/16/2021   PLT 230 07/16/2021   Lab Results  Component Value  Date   NA 140 07/16/2021   K 3.7 07/16/2021   CO2 28 07/16/2021   GLUCOSE 112 (H) 07/16/2021   BUN 13 07/16/2021   CREATININE 0.66 07/16/2021   BILITOT 0.4 07/16/2021   ALKPHOS 99 07/16/2021   AST 15 07/16/2021   ALT 21 07/16/2021   PROT 6.9 07/16/2021   ALBUMIN 3.4 (L) 07/16/2021   CALCIUM 8.1 (L) 07/16/2021   ANIONGAP 8 07/16/2021   GFR 69.56 05/13/2020   Lab Results  Component Value Date   CHOL 198 10/30/2019   Lab Results  Component Value Date   HDL 58 10/30/2019   Lab Results  Component Value Date   LDLCALC 126 (H) 10/30/2019   Lab Results  Component Value Date   TRIG 52 10/30/2019   Lab Results  Component Value Date   CHOLHDL 3.4 10/30/2019   No results found for: HGBA1C    Assessment & Plan:   Problem List Items Addressed This Visit       Cardiovascular and Mediastinum   Hypertension - Primary   Relevant Medications   amLODipine (NORVASC) 5 MG tablet     Digestive   GERD (gastroesophageal reflux disease)    On Nexium 40 mg twice daily Advised to use Nexium once daily and use other dose only as needed        Endocrine   Multinodular goiter    On Methimazole 5 mg BID Follows Endocrinology Last TSH and T4 wnl        Other   Chronic back pain    Chronic low back pain with radiation to right LE No recent injury or heavy  lifting We will check x-ray of the lumbar spine Flexeril as needed for back muscle spasms Simple back exercises advised Avoid heavy lifting and frequent bending Tylenol as needed Back brace and/or heating pad as needed      Relevant Medications   cyclobenzaprine (FLEXERIL) 5 MG tablet   Other Relevant Orders   DG Lumbar Spine Complete   Other Visit Diagnoses     Refused influenza vaccine           Meds ordered this encounter  Medications   amLODipine (NORVASC) 5 MG tablet    Sig: Take 1 tablet (5 mg total) by mouth daily.    Dispense:  90 tablet    Refill:  1   cyclobenzaprine (FLEXERIL) 5 MG tablet     Sig: Take 1 tablet (5 mg total) by mouth 3 (three) times daily as needed for muscle spasms.    Dispense:  30 tablet    Refill:  1    Follow-up: Return in about 4 months (around 02/06/2022) for HTN.    Lindell Spar, MD

## 2021-10-13 ENCOUNTER — Other Ambulatory Visit: Payer: Self-pay

## 2021-10-13 ENCOUNTER — Ambulatory Visit (HOSPITAL_COMMUNITY)
Admission: RE | Admit: 2021-10-13 | Discharge: 2021-10-13 | Disposition: A | Discharge status: Home / Self Care | Payer: BC Managed Care – PPO | Source: Ambulatory Visit | Attending: Internal Medicine | Admitting: Internal Medicine

## 2021-10-13 DIAGNOSIS — G8929 Other chronic pain: Secondary | ICD-10-CM | POA: Diagnosis not present

## 2021-10-13 DIAGNOSIS — M5440 Lumbago with sciatica, unspecified side: Secondary | ICD-10-CM | POA: Diagnosis not present

## 2021-10-13 DIAGNOSIS — M545 Low back pain, unspecified: Secondary | ICD-10-CM | POA: Diagnosis not present

## 2021-10-13 DIAGNOSIS — M5136 Other intervertebral disc degeneration, lumbar region: Secondary | ICD-10-CM | POA: Diagnosis not present

## 2021-10-14 ENCOUNTER — Telehealth: Payer: Self-pay | Admitting: Internal Medicine

## 2021-10-14 ENCOUNTER — Other Ambulatory Visit: Payer: Self-pay | Admitting: *Deleted

## 2021-10-14 DIAGNOSIS — G8929 Other chronic pain: Secondary | ICD-10-CM

## 2021-10-14 NOTE — Telephone Encounter (Signed)
Pt called to return phone call

## 2021-10-14 NOTE — Telephone Encounter (Signed)
Pt advised of xray results with verbal understanding

## 2021-10-31 DIAGNOSIS — I10 Essential (primary) hypertension: Secondary | ICD-10-CM | POA: Diagnosis not present

## 2021-10-31 DIAGNOSIS — Z6836 Body mass index (BMI) 36.0-36.9, adult: Secondary | ICD-10-CM | POA: Diagnosis not present

## 2021-10-31 DIAGNOSIS — M4310 Spondylolisthesis, site unspecified: Secondary | ICD-10-CM | POA: Diagnosis not present

## 2021-11-11 DIAGNOSIS — M4310 Spondylolisthesis, site unspecified: Secondary | ICD-10-CM | POA: Diagnosis not present

## 2021-11-11 DIAGNOSIS — M545 Low back pain, unspecified: Secondary | ICD-10-CM | POA: Diagnosis not present

## 2021-11-11 DIAGNOSIS — M79604 Pain in right leg: Secondary | ICD-10-CM | POA: Diagnosis not present

## 2021-11-13 DIAGNOSIS — M4310 Spondylolisthesis, site unspecified: Secondary | ICD-10-CM | POA: Diagnosis not present

## 2021-11-17 ENCOUNTER — Other Ambulatory Visit: Payer: Self-pay | Admitting: Neurological Surgery

## 2021-11-18 ENCOUNTER — Encounter (HOSPITAL_COMMUNITY): Payer: BC Managed Care – PPO

## 2021-11-18 ENCOUNTER — Ambulatory Visit (HOSPITAL_COMMUNITY): Payer: BC Managed Care – PPO

## 2021-12-04 ENCOUNTER — Other Ambulatory Visit: Payer: Self-pay

## 2021-12-04 ENCOUNTER — Ambulatory Visit (HOSPITAL_COMMUNITY)
Admission: RE | Admit: 2021-12-04 | Discharge: 2021-12-04 | Disposition: A | Discharge status: Home / Self Care | Payer: BC Managed Care – PPO | Source: Ambulatory Visit | Attending: Adult Health | Admitting: Adult Health

## 2021-12-04 DIAGNOSIS — N6321 Unspecified lump in the left breast, upper outer quadrant: Secondary | ICD-10-CM | POA: Insufficient documentation

## 2021-12-04 DIAGNOSIS — N644 Mastodynia: Secondary | ICD-10-CM

## 2021-12-04 DIAGNOSIS — R922 Inconclusive mammogram: Secondary | ICD-10-CM | POA: Diagnosis not present

## 2021-12-04 NOTE — Pre-Procedure Instructions (Signed)
Surgical Instructions    Your procedure is scheduled on Monday 12/08/21.   Report to [facility] Main Entrance "A" at 11:40 A.M., then check in with the Admitting office.  Call this number if you have problems the morning of surgery:  [phone]   If you have any questions prior to your surgery date call [phone]: Open Monday-Friday 8am-4pm    Remember:  Do not eat after midnight the night before your surgery  You may drink clear liquids until 10:40 A.M. the morning of your surgery.   Clear liquids allowed are: Water, Non-Citrus Juices (without pulp), Carbonated Beverages, Clear Tea, Black Coffee Only, and Gatorade    Take these medicines the morning of surgery with A SIP OF WATER   amLODipine (NORVASC)  methimazole (TAPAZOLE)   esomeprazole (NEXIUM)- If needed  As of today, STOP taking any Aspirin (unless otherwise instructed by your surgeon) Aleve, Naproxen, Ibuprofen, Motrin, Advil, Goody's, BC's, all herbal medications, fish oil, and all vitamins.                     Do NOT Smoke (Tobacco/Vaping) or drink Alcohol 24 hours prior to your procedure.  If you use a CPAP at night, you may bring all equipment for your overnight stay.   Contacts, glasses, piercing's, hearing aid's, dentures or partials may not be worn into surgery, please bring cases for these belongings.    For patients admitted to the hospital, discharge time will be determined by your treatment team.   Patients discharged the day of surgery will not be allowed to drive home, and someone needs to stay with them for 24 hours.  NO VISITORS WILL BE ALLOWED IN PRE-OP WHERE PATIENTS GET READY FOR SURGERY.  ONLY 1 SUPPORT PERSON MAY BE PRESENT IN THE WAITING ROOM WHILE YOU ARE IN SURGERY.  IF YOU ARE TO BE ADMITTED, ONCE YOU ARE IN YOUR ROOM YOU WILL BE ALLOWED TWO (2) VISITORS.  Minor children may have two parents present. Special consideration for safety and communication needs will be reviewed on a case by case  basis.   Special instructions:   [place]- Preparing For Surgery  Before surgery, you can play an important role. Because skin is not sterile, your skin needs to be as free of germs as possible. You can reduce the number of germs on your skin by washing with CHG (chlorahexidine gluconate) Soap before surgery.  CHG is an antiseptic cleaner which kills germs and bonds with the skin to continue killing germs even after washing.    Oral Hygiene is also important to reduce your risk of infection.  Remember - BRUSH YOUR TEETH THE MORNING OF SURGERY WITH YOUR REGULAR TOOTHPASTE  Please do not use if you have an allergy to CHG or antibacterial soaps. If your skin becomes reddened/irritated stop using the CHG.  Do not shave (including legs and underarms) for at least 48 hours prior to first CHG shower. It is OK to shave your face.  Please follow these instructions carefully.   Shower the NIGHT BEFORE SURGERY and the MORNING OF SURGERY  If you chose to wash your hair, wash your hair first as usual with your normal shampoo.  After you shampoo, rinse your hair and body thoroughly to remove the shampoo.  Use CHG Soap as you would any other liquid soap. You can apply CHG directly to the skin and wash gently with a scrungie or a clean washcloth.   Apply the CHG Soap to your body  ONLY FROM THE NECK DOWN.  Do not use on open wounds or open sores. Avoid contact with your eyes, ears, mouth and genitals (private parts). Wash Face and genitals (private parts)  with your normal soap.   Wash thoroughly, paying special attention to the area where your surgery will be performed.  Thoroughly rinse your body with warm water from the neck down.  DO NOT shower/wash with your normal soap after using and rinsing off the CHG Soap.  Pat yourself dry with a CLEAN TOWEL.  Wear CLEAN PAJAMAS to bed the night before surgery  Place CLEAN SHEETS on your bed the night before your surgery  DO NOT SLEEP WITH  PETS.   Day of Surgery: Shower with CHG soap. Do not wear jewelry, make up, nail polish, gel polish, artificial nails, or any other type of covering on natural nails including finger and toenails. If patients have artificial nails, gel coating, etc. that need to be removed by a nail salon please have this removed prior to surgery. Surgery may need to be canceled/delayed if the surgeon/ anesthesia feels like the patient is unable to be adequately monitored. Do not wear lotions, powders, perfumes/colognes, or deodorant. Do not shave 48 hours prior to surgery.  Men may shave face and neck. Do not bring valuables to the hospital. [facility] is not responsible for any belongings or valuables. Wear Clean/Comfortable clothing the morning of surgery Remember to brush your teeth WITH YOUR REGULAR TOOTHPASTE.   Please read over the following fact sheets that you were given.   3 days prior to your procedure or After your COVID test   You are not required to quarantine however you are required to wear a well-fitting mask when you are out and around people not in your household. If your mask becomes wet or soiled, replace with a new one.   Wash your hands often with soap and water for 20 seconds or clean your hands with an alcohol-based hand sanitizer that contains at least 60% alcohol.   Do not share personal items.   Notify your provider:  o if you are in close contact with someone who has COVID  o or if you develop a fever of 100.4 or greater, sneezing, cough, sore throat, shortness of breath or body aches.

## 2021-12-05 ENCOUNTER — Other Ambulatory Visit: Payer: Self-pay

## 2021-12-05 ENCOUNTER — Encounter (HOSPITAL_COMMUNITY): Payer: Self-pay

## 2021-12-05 ENCOUNTER — Encounter (HOSPITAL_COMMUNITY)
Admission: RE | Admit: 2021-12-05 | Discharge: 2021-12-05 | Disposition: A | Discharge status: Home / Self Care | Payer: BC Managed Care – PPO | Source: Ambulatory Visit | Attending: Neurological Surgery | Admitting: Neurological Surgery

## 2021-12-05 VITALS — BP 129/82 | HR 84 | Temp 98.1°F | Resp 18 | Ht 64.0 in | Wt 214.2 lb

## 2021-12-05 DIAGNOSIS — K219 Gastro-esophageal reflux disease without esophagitis: Secondary | ICD-10-CM | POA: Insufficient documentation

## 2021-12-05 DIAGNOSIS — F17200 Nicotine dependence, unspecified, uncomplicated: Secondary | ICD-10-CM | POA: Insufficient documentation

## 2021-12-05 DIAGNOSIS — M5416 Radiculopathy, lumbar region: Secondary | ICD-10-CM | POA: Diagnosis not present

## 2021-12-05 DIAGNOSIS — Z01812 Encounter for preprocedural laboratory examination: Secondary | ICD-10-CM | POA: Insufficient documentation

## 2021-12-05 DIAGNOSIS — M5126 Other intervertebral disc displacement, lumbar region: Secondary | ICD-10-CM | POA: Insufficient documentation

## 2021-12-05 DIAGNOSIS — M4326 Fusion of spine, lumbar region: Secondary | ICD-10-CM | POA: Diagnosis not present

## 2021-12-05 DIAGNOSIS — Z9851 Tubal ligation status: Secondary | ICD-10-CM | POA: Diagnosis not present

## 2021-12-05 DIAGNOSIS — F419 Anxiety disorder, unspecified: Secondary | ICD-10-CM | POA: Diagnosis not present

## 2021-12-05 DIAGNOSIS — Z01818 Encounter for other preprocedural examination: Secondary | ICD-10-CM

## 2021-12-05 DIAGNOSIS — E042 Nontoxic multinodular goiter: Secondary | ICD-10-CM | POA: Insufficient documentation

## 2021-12-05 DIAGNOSIS — M5136 Other intervertebral disc degeneration, lumbar region: Secondary | ICD-10-CM | POA: Insufficient documentation

## 2021-12-05 DIAGNOSIS — M48061 Spinal stenosis, lumbar region without neurogenic claudication: Secondary | ICD-10-CM | POA: Diagnosis not present

## 2021-12-05 DIAGNOSIS — I10 Essential (primary) hypertension: Secondary | ICD-10-CM | POA: Diagnosis not present

## 2021-12-05 DIAGNOSIS — Z20822 Contact with and (suspected) exposure to covid-19: Secondary | ICD-10-CM | POA: Diagnosis not present

## 2021-12-05 DIAGNOSIS — M4316 Spondylolisthesis, lumbar region: Secondary | ICD-10-CM | POA: Diagnosis not present

## 2021-12-05 DIAGNOSIS — K2100 Gastro-esophageal reflux disease with esophagitis, without bleeding: Secondary | ICD-10-CM | POA: Diagnosis not present

## 2021-12-05 DIAGNOSIS — Z6836 Body mass index (BMI) 36.0-36.9, adult: Secondary | ICD-10-CM | POA: Diagnosis not present

## 2021-12-05 DIAGNOSIS — Z8249 Family history of ischemic heart disease and other diseases of the circulatory system: Secondary | ICD-10-CM | POA: Diagnosis not present

## 2021-12-05 DIAGNOSIS — E0590 Thyrotoxicosis, unspecified without thyrotoxic crisis or storm: Secondary | ICD-10-CM | POA: Insufficient documentation

## 2021-12-05 DIAGNOSIS — E0520 Thyrotoxicosis with toxic multinodular goiter without thyrotoxic crisis or storm: Secondary | ICD-10-CM | POA: Diagnosis not present

## 2021-12-05 DIAGNOSIS — E669 Obesity, unspecified: Secondary | ICD-10-CM | POA: Diagnosis not present

## 2021-12-05 DIAGNOSIS — Z981 Arthrodesis status: Secondary | ICD-10-CM | POA: Diagnosis not present

## 2021-12-05 DIAGNOSIS — F17210 Nicotine dependence, cigarettes, uncomplicated: Secondary | ICD-10-CM | POA: Diagnosis not present

## 2021-12-05 HISTORY — DX: Headache, unspecified: R51.9

## 2021-12-05 HISTORY — DX: Anxiety disorder, unspecified: F41.9

## 2021-12-05 LAB — BASIC METABOLIC PANEL
Anion gap: 6 (ref 5–15)
BUN: 12 mg/dL (ref 6–20)
CO2: 27 mmol/L (ref 22–32)
Calcium: 8.9 mg/dL (ref 8.9–10.3)
Chloride: 106 mmol/L (ref 98–111)
Creatinine, Ser: 0.76 mg/dL (ref 0.44–1.00)
GFR, Estimated: >60 mL/min (ref >60)
Glucose, Bld: 102 mg/dL — ABNORMAL HIGH (ref 70–99)
Potassium: 3.8 mmol/L (ref 3.5–5.1)
Sodium: 139 mmol/L (ref 135–145)

## 2021-12-05 LAB — TYPE AND SCREEN
ABO/RH(D): O POS
Antibody Screen: NEGATIVE

## 2021-12-05 LAB — CBC
HCT: 40.3 % (ref 36.0–46.0)
Hemoglobin: 12.4 g/dL (ref 12.0–15.0)
MCH: 27.5 pg (ref 26.0–34.0)
MCHC: 30.8 g/dL (ref 30.0–36.0)
MCV: 89.4 fL (ref 80.0–100.0)
Platelets: 288 10*3/uL (ref 150–400)
RBC: 4.51 MIL/uL (ref 3.87–5.11)
RDW: 14.1 % (ref 11.5–15.5)
WBC: 7 10*3/uL (ref 4.0–10.5)
nRBC: 0 % (ref 0.0–0.2)

## 2021-12-05 LAB — SURGICAL PCR SCREEN
MRSA, PCR: NEGATIVE
Staphylococcus aureus: POSITIVE — AB

## 2021-12-05 NOTE — Progress Notes (Signed)
Anesthesia Chart Review:  Case: [id number] Date/Time: 12/08/21 0716   Procedure: L3-4 MIS TLIF - 3C/RM 21   Anesthesia type: General   Pre-op diagnosis: Isthmic spondylolisthesis   Location: [facility] OR ROOM 21 / [place] OR   Surgeons: Judith Part, MD       DISCUSSION: Patient is a 46 year old female scheduled for the above procedure.  History includes smoking, HTN, multi-nodular goiter with subclinical hyperthyroidism (on Tapazole, started 03/2020 due to symptoms), acid reflux/reflux esophagitis, anxiety, headaches, obesity.  No additional cardiac work-up recommended per Dr. Domenic Polite following 10/2020 stress test and echocardiogram. She developed palpitations on Flexeril 5 mg (listed as medication intolerance).   Per 09/04/2021 GI records, her reflux esophagitis symptoms have greatly improved since on Nexium twice daily and Pepcid in the evenings.  She had EGD and colonoscopy on 06/05/2021 showing 4 mm cecal tubular adenoma, external hemorrhoids, reactive gastropathy.  Repeat surveillance colonoscopy in 2027.  TSH and Free T4 normal on 07/18/21. Dr. Kelton Pillar felt she was clinically and biochemically euthyroid at that visit.  Had benign right upper thyroid nodule FNA 08/2019. Stable thyroid US 11/2020.  55-month follow-up planned.  12/05/2021 presurgical COVID-19 test is still in process.  She will need a urine pregnancy test on the day of surgery.   VS: BP 129/82    Pulse 84    Temp 36.7 C (Oral)    Resp 18    Ht 5\' 4"  (1.626 m)    Wt 97.2 kg    SpO2 100%    BMI 36.77 kg/m   PROVIDERS: Lindell Spar, MD is PCP  Rozann Lesches, MD is cardiologist. Seen on 11/08/20 for chest pain/burning.  No additional cardiac testing recommended following 10/2020 stress test and echocardiogram. Collier Flowers, MD is endocrinologist.  Last visit 07/18/2021. Maylon Peppers, MD is GI.  Last visit 09/04/2021 with Scherrie Gerlach, NP.   LABS: Labs reviewed: Acceptable for surgery. (all labs ordered are  listed, but only abnormal results are displayed)  Labs Reviewed  BASIC METABOLIC PANEL - Abnormal; Notable for the following components:      Result Value   Glucose, Bld 102 (*)    All other components within normal limits  SURGICAL PCR SCREEN  SARS CORONAVIRUS 2 (TAT 6-24 HRS)  CBC  TYPE AND SCREEN     IMAGES: MRI L-spine 11/11/21 (Canopy/PACS): IMPRESSION: Bilateral pars defects at L3 with a grade 1 spondylolisthesis. Broad-based bulging degenerated and uncovered disc at L3-4 contributing to spinal, lateral recess and significant bilateral foraminal stenosis.  1V PCXR 05/01/21: FINDINGS: The heart size and mediastinal contours are within normal limits. Both lungs are clear. The visualized skeletal structures are unremarkable. IMPRESSION: No active disease.   US Thyroid 12/24/20: IMPRESSION: 1. Stable 1.9 cm isthmus nodule which meets criteria for additional 1 year follow-up ultrasound. This nodule has been stable for a year and a half. 2. Stable previously sample 3.9 cm right thyroid nodule. Prior cytology was benign. 3. Additional stable small right inferior and isthmus nodules which do not meet criteria for fine-needle aspiration. 4. Stable nonenlarged small left submandibular lymph node.   EKG: 07/16/21: Sinus rhythm Borderline prolonged PR interval (PR 202 ms)   CV: Echo 11/21/20: IMPRESSIONS   1. Left ventricular ejection fraction, by estimation, is 55 to 60%. The  left ventricle has normal function. The left ventricle has no regional  wall motion abnormalities. Left ventricular diastolic parameters were  normal.   2. Right ventricular systolic function is normal. The right ventricular  size  is normal. There is normal pulmonary artery systolic pressure.   3. The mitral valve is grossly normal. Trivial mitral valve  regurgitation.   4. The aortic valve is tricuspid. Aortic valve regurgitation is not  visualized.   5. The inferior vena cava is dilated in  size with >50% respiratory  variability, suggesting right atrial pressure of 8 mmHg.    Nuclear stress test 11/18/20: Blood pressure demonstrated a normal response to exercise. There was no ST segment deviation noted during stress.   Normal resting and stress perfusion. No ischemia or infarction EF 48% but visually normal ? Artifactually low due to diaphragmatic uptake    Past Medical History:  Diagnosis Date   Acid reflux    Anxiety    Headache    Hypertension    Hyperthyroidism     Past Surgical History:  Procedure Laterality Date   BIOPSY  06/05/2021   Procedure: BIOPSY;  Surgeon: Rogene Houston, MD;  Location: [facility];  Service: Endoscopy;;   COLONOSCOPY N/A 06/05/2021   rehman:The examined portion of the ileum was normal. one 37mm polyp in cecum (tubular adenoma). external hemorrhoids   ESOPHAGOGASTRODUODENOSCOPY N/A 06/05/2021   Rehman:Normal hypopharynx.normal esophagus, z line irregular 36 cm from incisors, gastritics with reactive gastropathy, normal duodenal bulb and second portion of duodenum   FOOT SURGERY Right    LEG SURGERY Right    TUBAL LIGATION      MEDICATIONS:  amLODipine (NORVASC) 5 MG tablet   cyclobenzaprine (FLEXERIL) 5 MG tablet   esomeprazole (NEXIUM) 40 MG packet   famotidine (PEPCID) 40 MG tablet   Flaxseed, Linseed, (FLAXSEED OIL PO)   gabapentin (NEURONTIN) 100 MG capsule   methimazole (TAPAZOLE) 5 MG tablet   Omega-3 Fatty Acids (FISH OIL) 1200 MG CAPS   triamcinolone cream (KENALOG) 0.1 %   vitamin B-6 (PYRIDOXINE) 25 MG tablet   No current facility-administered medications for this encounter.    Myra Gianotti, PA-C Surgical Short Stay/Anesthesiology [facility] Phone [phone] [facility] Phone [phone] 12/05/2021 5:15 PM

## 2021-12-05 NOTE — Anesthesia Preprocedure Evaluation (Addendum)
Anesthesia Evaluation  Patient identified by MRN, date of birth, ID band Patient awake    Reviewed: Allergy & Precautions, H&P , NPO status , Patient's Chart, lab work & pertinent test results  Airway Mallampati: II   Neck ROM: full    Dental   Pulmonary Current Smoker and Patient abstained from smoking.,    breath sounds clear to auscultation       Cardiovascular hypertension,  Rhythm:regular Rate:Normal     Neuro/Psych  Headaches, PSYCHIATRIC DISORDERS Anxiety  Neuromuscular disease    GI/Hepatic GERD  ,  Endo/Other  Hyperthyroidism   Renal/GU      Musculoskeletal   Abdominal   Peds  Hematology   Anesthesia Other Findings   Reproductive/Obstetrics                            Anesthesia Physical Anesthesia Plan  ASA: 2  Anesthesia Plan: General   Post-op Pain Management:    Induction: Intravenous  PONV Risk Score and Plan: 2 and Ondansetron, Dexamethasone, Midazolam and Treatment may vary due to age or medical condition  Airway Management Planned: Oral ETT  Additional Equipment:   Intra-op Plan:   Post-operative Plan: Extubation in OR  Informed Consent: I have reviewed the patients History and Physical, chart, labs and discussed the procedure including the risks, benefits and alternatives for the proposed anesthesia with the patient or authorized representative who has indicated his/her understanding and acceptance.     Dental advisory given  Plan Discussed with: CRNA, Anesthesiologist and Surgeon  Anesthesia Plan Comments: (PAT note written 12/05/2021 by Myra Gianotti, PA-C. )       Anesthesia Quick Evaluation

## 2021-12-05 NOTE — Progress Notes (Signed)
PCP - Dr. Ihor Dow, MD Cardiologist - Patient denies  PPM/ICD - n/a Device Orders -  Rep Notified -   Chest x-ray - 05/01/21 EKG - 07/16/21 Stress Test - 11/18/20 - done for palpitations, caused by medication, test normal ECHO - 11/21/20 - done for palpitations, caused by medication, test normal Cardiac Cath -   Sleep Study - patient denies CPAP -   Fasting Blood Sugar - n/a Checks Blood Sugar _____ times a day  Blood Thinner Instructions: n/a Aspirin Instructions: n/a  ERAS Protcol - clears until 0430 PRE-SURGERY Ensure or G2- none ordered  COVID TEST- done at PAT   Anesthesia review: n/a  Patient denies shortness of breath, fever, cough and chest pain at PAT appointment   All instructions explained to the patient, with a verbal understanding of the material. Patient agrees to go over the instructions while at home for a better understanding. Patient also instructed to self quarantine after being tested for COVID-19. The opportunity to ask questions was provided.

## 2021-12-06 LAB — SARS CORONAVIRUS 2 (TAT 6-24 HRS): SARS Coronavirus 2: NEGATIVE

## 2021-12-08 ENCOUNTER — Inpatient Hospital Stay (HOSPITAL_COMMUNITY): Payer: BC Managed Care – PPO | Admitting: Certified Registered"

## 2021-12-08 ENCOUNTER — Other Ambulatory Visit: Payer: Self-pay

## 2021-12-08 ENCOUNTER — Encounter (HOSPITAL_COMMUNITY): Payer: Self-pay | Admitting: Neurological Surgery

## 2021-12-08 ENCOUNTER — Inpatient Hospital Stay (HOSPITAL_COMMUNITY): Payer: BC Managed Care – PPO

## 2021-12-08 ENCOUNTER — Encounter (HOSPITAL_COMMUNITY)
Admission: RE | Disposition: A | Discharge status: Home / Self Care | Payer: Self-pay | Source: Ambulatory Visit | Attending: Neurological Surgery

## 2021-12-08 ENCOUNTER — Inpatient Hospital Stay (HOSPITAL_COMMUNITY)
Admission: RE | Admit: 2021-12-08 | Discharge: 2021-12-09 | DRG: 455 | Disposition: A | Discharge status: Home / Self Care | Payer: BC Managed Care – PPO | Attending: Neurological Surgery | Admitting: Neurological Surgery

## 2021-12-08 DIAGNOSIS — M4326 Fusion of spine, lumbar region: Secondary | ICD-10-CM | POA: Diagnosis not present

## 2021-12-08 DIAGNOSIS — Z6836 Body mass index (BMI) 36.0-36.9, adult: Secondary | ICD-10-CM

## 2021-12-08 DIAGNOSIS — E669 Obesity, unspecified: Secondary | ICD-10-CM | POA: Diagnosis present

## 2021-12-08 DIAGNOSIS — Z9851 Tubal ligation status: Secondary | ICD-10-CM | POA: Diagnosis not present

## 2021-12-08 DIAGNOSIS — Z20822 Contact with and (suspected) exposure to covid-19: Secondary | ICD-10-CM | POA: Diagnosis present

## 2021-12-08 DIAGNOSIS — Z981 Arthrodesis status: Secondary | ICD-10-CM | POA: Diagnosis not present

## 2021-12-08 DIAGNOSIS — Z8249 Family history of ischemic heart disease and other diseases of the circulatory system: Secondary | ICD-10-CM

## 2021-12-08 DIAGNOSIS — I10 Essential (primary) hypertension: Secondary | ICD-10-CM | POA: Diagnosis present

## 2021-12-08 DIAGNOSIS — M5416 Radiculopathy, lumbar region: Secondary | ICD-10-CM | POA: Diagnosis present

## 2021-12-08 DIAGNOSIS — E0520 Thyrotoxicosis with toxic multinodular goiter without thyrotoxic crisis or storm: Secondary | ICD-10-CM | POA: Diagnosis present

## 2021-12-08 DIAGNOSIS — M48061 Spinal stenosis, lumbar region without neurogenic claudication: Secondary | ICD-10-CM | POA: Diagnosis present

## 2021-12-08 DIAGNOSIS — F419 Anxiety disorder, unspecified: Secondary | ICD-10-CM | POA: Diagnosis present

## 2021-12-08 DIAGNOSIS — M4316 Spondylolisthesis, lumbar region: Secondary | ICD-10-CM | POA: Diagnosis not present

## 2021-12-08 DIAGNOSIS — K2100 Gastro-esophageal reflux disease with esophagitis, without bleeding: Secondary | ICD-10-CM | POA: Diagnosis present

## 2021-12-08 DIAGNOSIS — F17210 Nicotine dependence, cigarettes, uncomplicated: Secondary | ICD-10-CM | POA: Diagnosis present

## 2021-12-08 DIAGNOSIS — Z419 Encounter for procedure for purposes other than remedying health state, unspecified: Secondary | ICD-10-CM

## 2021-12-08 HISTORY — PX: TRANSFORAMINAL LUMBAR INTERBODY FUSION W/ MIS 1 LEVEL: SHX6145

## 2021-12-08 LAB — POCT PREGNANCY, URINE: Preg Test, Ur: NEGATIVE

## 2021-12-08 LAB — ABO/RH: ABO/RH(D): O POS

## 2021-12-08 SURGERY — MINIMALLY INVASIVE (MIS) TRANSFORAMINAL LUMBAR INTERBODY FUSION (TLIF) 1 LEVEL
Anesthesia: General

## 2021-12-08 MED ORDER — ONDANSETRON HCL 4 MG/2ML IJ SOLN: 4.0000 mg | Freq: Four times a day (QID) | INTRAMUSCULAR | Status: DC | PRN

## 2021-12-08 MED ORDER — LACTATED RINGERS IV SOLN: INTRAVENOUS | Status: DC

## 2021-12-08 MED ORDER — CEFAZOLIN SODIUM-DEXTROSE 2-4 GM/100ML-% IV SOLN
2.0000 g | Freq: Three times a day (TID) | INTRAVENOUS | Status: AC
  Administered 2021-12-08 (×2): 2 g via INTRAVENOUS
  Filled 2021-12-08 (×2): qty 100

## 2021-12-08 MED ORDER — SUGAMMADEX SODIUM 200 MG/2ML IV SOLN
INTRAVENOUS | Status: DC | PRN
  Administered 2021-12-08: 200 mg via INTRAVENOUS

## 2021-12-08 MED ORDER — LIDOCAINE-EPINEPHRINE 1 %-1:100000 IJ SOLN
INTRAMUSCULAR | Status: AC
  Filled 2021-12-08: qty 1

## 2021-12-08 MED ORDER — ESOMEPRAZOLE MAGNESIUM 40 MG PO PACK: 40.0000 mg | PACK | Freq: Every day | ORAL | Status: DC | PRN

## 2021-12-08 MED ORDER — MIDAZOLAM HCL 2 MG/2ML IJ SOLN
INTRAMUSCULAR | Status: AC
  Filled 2021-12-08: qty 2

## 2021-12-08 MED ORDER — GABAPENTIN 100 MG PO CAPS
100.0000 mg | ORAL_CAPSULE | Freq: Every day | ORAL | Status: DC
Start: 2021-12-08 — End: 2021-12-09
  Administered 2021-12-08: 100 mg via ORAL
  Filled 2021-12-08: qty 1

## 2021-12-08 MED ORDER — SODIUM CHLORIDE 0.9% FLUSH: 3.0000 mL | INTRAVENOUS | Status: DC | PRN

## 2021-12-08 MED ORDER — PHENOL 1.4 % MT LIQD: 1.0000 | OROMUCOSAL | Status: DC | PRN

## 2021-12-08 MED ORDER — CEFAZOLIN SODIUM-DEXTROSE 2-4 GM/100ML-% IV SOLN
INTRAVENOUS | Status: AC
  Filled 2021-12-08: qty 100

## 2021-12-08 MED ORDER — FENTANYL CITRATE (PF) 250 MCG/5ML IJ SOLN
INTRAMUSCULAR | Status: AC
  Filled 2021-12-08: qty 5

## 2021-12-08 MED ORDER — EPHEDRINE 5 MG/ML INJ
INTRAVENOUS | Status: AC
  Filled 2021-12-08: qty 5

## 2021-12-08 MED ORDER — ROCURONIUM BROMIDE 10 MG/ML (PF) SYRINGE
PREFILLED_SYRINGE | INTRAVENOUS | Status: AC
  Filled 2021-12-08: qty 10

## 2021-12-08 MED ORDER — ONDANSETRON HCL 4 MG/2ML IJ SOLN
INTRAMUSCULAR | Status: AC
  Filled 2021-12-08: qty 2

## 2021-12-08 MED ORDER — DIPHENHYDRAMINE HCL 50 MG/ML IJ SOLN
INTRAMUSCULAR | Status: DC | PRN
  Administered 2021-12-08: 6.25 mg via INTRAVENOUS

## 2021-12-08 MED ORDER — ONDANSETRON HCL 4 MG PO TABS: 4.0000 mg | ORAL_TABLET | Freq: Four times a day (QID) | ORAL | Status: DC | PRN

## 2021-12-08 MED ORDER — DEXAMETHASONE SODIUM PHOSPHATE 10 MG/ML IJ SOLN
INTRAMUSCULAR | Status: DC | PRN
  Administered 2021-12-08: 10 mg via INTRAVENOUS

## 2021-12-08 MED ORDER — ONDANSETRON HCL 4 MG/2ML IJ SOLN
INTRAMUSCULAR | Status: DC | PRN
  Administered 2021-12-08: 4 mg via INTRAVENOUS

## 2021-12-08 MED ORDER — METHIMAZOLE 5 MG PO TABS
5.0000 mg | ORAL_TABLET | Freq: Every day | ORAL | Status: DC
  Administered 2021-12-09: 5 mg via ORAL
  Filled 2021-12-08: qty 1

## 2021-12-08 MED ORDER — KETAMINE HCL 10 MG/ML IJ SOLN
INTRAMUSCULAR | Status: DC | PRN
Start: 2021-12-08 — End: 2021-12-08
  Administered 2021-12-08 (×4): 10 mg via INTRAVENOUS

## 2021-12-08 MED ORDER — ACETAMINOPHEN 650 MG RE SUPP: 650.0000 mg | RECTAL | Status: DC | PRN

## 2021-12-08 MED ORDER — FENTANYL CITRATE (PF) 100 MCG/2ML IJ SOLN
25.0000 ug | INTRAMUSCULAR | Status: DC | PRN
  Administered 2021-12-08 (×4): 50 ug via INTRAVENOUS

## 2021-12-08 MED ORDER — HYDROMORPHONE HCL 1 MG/ML IJ SOLN
0.2500 mg | INTRAMUSCULAR | Status: DC | PRN
  Administered 2021-12-08 (×2): 0.25 mg via INTRAVENOUS

## 2021-12-08 MED ORDER — FENTANYL CITRATE (PF) 100 MCG/2ML IJ SOLN
INTRAMUSCULAR | Status: AC
  Filled 2021-12-08: qty 2

## 2021-12-08 MED ORDER — HYDROMORPHONE HCL 1 MG/ML IJ SOLN
1.0000 mg | INTRAMUSCULAR | Status: DC | PRN
  Administered 2021-12-08: 1 mg via INTRAVENOUS
  Filled 2021-12-08: qty 1

## 2021-12-08 MED ORDER — HYDROMORPHONE HCL 1 MG/ML IJ SOLN
INTRAMUSCULAR | Status: AC
  Filled 2021-12-08: qty 1

## 2021-12-08 MED ORDER — LIDOCAINE 2% (20 MG/ML) 5 ML SYRINGE
INTRAMUSCULAR | Status: DC | PRN
  Administered 2021-12-08: 50 mg via INTRAVENOUS

## 2021-12-08 MED ORDER — DOCUSATE SODIUM 100 MG PO CAPS
100.0000 mg | ORAL_CAPSULE | Freq: Two times a day (BID) | ORAL | Status: DC
  Administered 2021-12-08 – 2021-12-09 (×2): 100 mg via ORAL
  Filled 2021-12-08 (×2): qty 1

## 2021-12-08 MED ORDER — AMLODIPINE BESYLATE 5 MG PO TABS: 5.0000 mg | ORAL_TABLET | Freq: Every day | ORAL | Status: DC

## 2021-12-08 MED ORDER — FENTANYL CITRATE (PF) 100 MCG/2ML IJ SOLN
INTRAMUSCULAR | Status: DC | PRN
Start: 2021-12-08 — End: 2021-12-08
  Administered 2021-12-08 (×4): 50 ug via INTRAVENOUS

## 2021-12-08 MED ORDER — PROPOFOL 10 MG/ML IV BOLUS
INTRAVENOUS | Status: AC
  Filled 2021-12-08: qty 20

## 2021-12-08 MED ORDER — OXYCODONE HCL 5 MG PO TABS
5.0000 mg | ORAL_TABLET | ORAL | Status: DC | PRN
  Administered 2021-12-08: 5 mg via ORAL
  Filled 2021-12-08: qty 1

## 2021-12-08 MED ORDER — POLYETHYLENE GLYCOL 3350 17 G PO PACK: 17.0000 g | PACK | Freq: Every day | ORAL | Status: DC | PRN

## 2021-12-08 MED ORDER — KETAMINE HCL 50 MG/5ML IJ SOSY
PREFILLED_SYRINGE | INTRAMUSCULAR | Status: AC
  Filled 2021-12-08: qty 5

## 2021-12-08 MED ORDER — OXYCODONE HCL 5 MG/5ML PO SOLN: 5.0000 mg | Freq: Once | ORAL | Status: DC | PRN

## 2021-12-08 MED ORDER — CHLORHEXIDINE GLUCONATE CLOTH 2 % EX PADS: 6.0000 | MEDICATED_PAD | Freq: Once | CUTANEOUS | Status: DC

## 2021-12-08 MED ORDER — CHLORHEXIDINE GLUCONATE 0.12 % MT SOLN: 15.0000 mL | Freq: Once | OROMUCOSAL | Status: AC

## 2021-12-08 MED ORDER — TRIAMCINOLONE ACETONIDE 0.1 % EX CREA
1.0000 "application " | TOPICAL_CREAM | Freq: Every day | CUTANEOUS | Status: DC | PRN
  Filled 2021-12-08: qty 15

## 2021-12-08 MED ORDER — SODIUM CHLORIDE 0.9% FLUSH
3.0000 mL | Freq: Two times a day (BID) | INTRAVENOUS | Status: DC
  Administered 2021-12-08: 3 mL via INTRAVENOUS

## 2021-12-08 MED ORDER — CEFAZOLIN SODIUM-DEXTROSE 2-4 GM/100ML-% IV SOLN
2.0000 g | INTRAVENOUS | Status: AC
  Administered 2021-12-08: 2 g via INTRAVENOUS

## 2021-12-08 MED ORDER — LIDOCAINE 2% (20 MG/ML) 5 ML SYRINGE
INTRAMUSCULAR | Status: AC
  Filled 2021-12-08: qty 5

## 2021-12-08 MED ORDER — LIDOCAINE-EPINEPHRINE 1 %-1:100000 IJ SOLN
INTRAMUSCULAR | Status: DC | PRN
  Administered 2021-12-08: 10 mL

## 2021-12-08 MED ORDER — PROPOFOL 10 MG/ML IV BOLUS
INTRAVENOUS | Status: DC | PRN
Start: 2021-12-08 — End: 2021-12-08
  Administered 2021-12-08: 200 mg via INTRAVENOUS

## 2021-12-08 MED ORDER — THROMBIN 5000 UNITS EX SOLR
OROMUCOSAL | Status: DC | PRN
  Administered 2021-12-08: 5 mL via TOPICAL

## 2021-12-08 MED ORDER — OXYCODONE HCL 5 MG PO TABS: 5.0000 mg | ORAL_TABLET | Freq: Once | ORAL | Status: DC | PRN

## 2021-12-08 MED ORDER — CHLORHEXIDINE GLUCONATE 0.12 % MT SOLN
OROMUCOSAL | Status: AC
  Administered 2021-12-08: 15 mL via OROMUCOSAL
  Filled 2021-12-08: qty 15

## 2021-12-08 MED ORDER — SODIUM CHLORIDE 0.9 % IV SOLN: 250.0000 mL | INTRAVENOUS | Status: DC

## 2021-12-08 MED ORDER — DIAZEPAM 5 MG PO TABS
10.0000 mg | ORAL_TABLET | Freq: Four times a day (QID) | ORAL | Status: DC | PRN
  Administered 2021-12-08: 10 mg via ORAL
  Filled 2021-12-08: qty 2

## 2021-12-08 MED ORDER — ORAL CARE MOUTH RINSE: 15.0000 mL | Freq: Once | OROMUCOSAL | Status: AC

## 2021-12-08 MED ORDER — MENTHOL 3 MG MT LOZG: 1.0000 | LOZENGE | OROMUCOSAL | Status: DC | PRN

## 2021-12-08 MED ORDER — ROCURONIUM BROMIDE 100 MG/10ML IV SOLN
INTRAVENOUS | Status: DC | PRN
  Administered 2021-12-08: 20 mg via INTRAVENOUS
  Administered 2021-12-08: 60 mg via INTRAVENOUS
  Administered 2021-12-08: 20 mg via INTRAVENOUS
  Administered 2021-12-08: 10 mg via INTRAVENOUS

## 2021-12-08 MED ORDER — ACETAMINOPHEN 325 MG PO TABS: 650.0000 mg | ORAL_TABLET | ORAL | Status: DC | PRN

## 2021-12-08 MED ORDER — OXYCODONE HCL 5 MG PO TABS
10.0000 mg | ORAL_TABLET | ORAL | Status: DC | PRN
  Administered 2021-12-08 – 2021-12-09 (×2): 10 mg via ORAL
  Filled 2021-12-08 (×2): qty 2

## 2021-12-08 MED ORDER — 0.9 % SODIUM CHLORIDE (POUR BTL) OPTIME
TOPICAL | Status: DC | PRN
  Administered 2021-12-08: 1000 mL

## 2021-12-08 MED ORDER — MIDAZOLAM HCL 5 MG/5ML IJ SOLN
INTRAMUSCULAR | Status: DC | PRN
  Administered 2021-12-08: 2 mg via INTRAVENOUS

## 2021-12-08 MED ORDER — PHENYLEPHRINE 40 MCG/ML (10ML) SYRINGE FOR IV PUSH (FOR BLOOD PRESSURE SUPPORT)
PREFILLED_SYRINGE | INTRAVENOUS | Status: DC | PRN
  Administered 2021-12-08: 80 ug via INTRAVENOUS

## 2021-12-08 MED ORDER — EPHEDRINE SULFATE-NACL 50-0.9 MG/10ML-% IV SOSY
PREFILLED_SYRINGE | INTRAVENOUS | Status: DC | PRN
  Administered 2021-12-08: 5 mg via INTRAVENOUS

## 2021-12-08 MED ORDER — THROMBIN 5000 UNITS EX SOLR
CUTANEOUS | Status: AC
  Filled 2021-12-08: qty 5000

## 2021-12-08 SURGICAL SUPPLY — 65 items
BAG COUNTER SPONGE SURGICOUNT (BAG) ×3 IMPLANT
BAND RUBBER #18 3X1/16 STRL (MISCELLANEOUS) ×4 IMPLANT
BASKET BONE COLLECTION (BASKET) ×2 IMPLANT
BLADE CLIPPER SURG (BLADE) IMPLANT
BLADE SURG 11 STRL SS (BLADE) ×2 IMPLANT
BUR MATCHSTICK NEURO 3.0 LAGG (BURR) IMPLANT
BUR PRECISION MATCH 3.0 13 (BURR) IMPLANT
BUR ROUND PRECISION 4.0 (BURR) ×2 IMPLANT
CAGE INTERBODY PL SHT 7X22.5 (Plate) ×1 IMPLANT
CNTNR URN SCR LID CUP LEK RST (MISCELLANEOUS) ×1 IMPLANT
CONT SPEC 4OZ STRL OR WHT (MISCELLANEOUS) ×1
COVER BACK TABLE 60X90IN (DRAPES) ×2 IMPLANT
DECANTER SPIKE VIAL GLASS SM (MISCELLANEOUS) ×1 IMPLANT
DERMABOND ADVANCED (GAUZE/BANDAGES/DRESSINGS) ×1
DERMABOND ADVANCED .7 DNX12 (GAUZE/BANDAGES/DRESSINGS) ×1 IMPLANT
DRAPE C-ARM 42X72 X-RAY (DRAPES) ×2 IMPLANT
DRAPE C-ARMOR (DRAPES) ×2 IMPLANT
DRAPE LAPAROTOMY 100X72X124 (DRAPES) ×2 IMPLANT
DRAPE MICROSCOPE LEICA (MISCELLANEOUS) ×2 IMPLANT
DRAPE SURG 17X23 STRL (DRAPES) ×4 IMPLANT
ELECT BLADE 6.5 EXT (BLADE) ×2 IMPLANT
ELECT REM PT RETURN 9FT ADLT (ELECTROSURGICAL) ×2
ELECTRODE REM PT RTRN 9FT ADLT (ELECTROSURGICAL) ×1 IMPLANT
EXTENDER TAB GUIDE SV 5.5/6.0 (INSTRUMENTS) ×8 IMPLANT
GAUZE 4X4 16PLY ~~LOC~~+RFID DBL (SPONGE) ×1 IMPLANT
GAUZE SPONGE 4X4 12PLY STRL (GAUZE/BANDAGES/DRESSINGS) ×1 IMPLANT
GLOVE SURG LTX SZ7.5 (GLOVE) ×2 IMPLANT
GLOVE SURG POLYISO LF SZ7.5 (GLOVE) ×5 IMPLANT
GLOVE SURG UNDER POLY LF SZ7.5 (GLOVE) ×2 IMPLANT
GOWN STRL REUS W/ TWL LRG LVL3 (GOWN DISPOSABLE) ×1 IMPLANT
GOWN STRL REUS W/ TWL XL LVL3 (GOWN DISPOSABLE) IMPLANT
GOWN STRL REUS W/TWL 2XL LVL3 (GOWN DISPOSABLE) IMPLANT
GOWN STRL REUS W/TWL LRG LVL3 (GOWN DISPOSABLE) ×1
GOWN STRL REUS W/TWL XL LVL3 (GOWN DISPOSABLE)
GUIDEWIRE BLUNT NT 450 (WIRE) ×4 IMPLANT
HEMOSTAT POWDER KIT SURGIFOAM (HEMOSTASIS) ×2 IMPLANT
KIT BASIN OR (CUSTOM PROCEDURE TRAY) ×2 IMPLANT
KIT POSITION SURG JACKSON T1 (MISCELLANEOUS) ×2 IMPLANT
KIT TURNOVER KIT B (KITS) ×1 IMPLANT
NDL BEVEL TWO-PAK W/1PK (NEEDLE) IMPLANT
NDL HYPO 18GX1.5 BLUNT FILL (NEEDLE) IMPLANT
NDL SPNL 18GX3.5 QUINCKE PK (NEEDLE) IMPLANT
NEEDLE BEVEL TWO-PAK W/1PK (NEEDLE) ×2 IMPLANT
NEEDLE HYPO 18GX1.5 BLUNT FILL (NEEDLE) IMPLANT
NEEDLE HYPO 22GX1.5 SAFETY (NEEDLE) ×2 IMPLANT
NEEDLE SPNL 18GX3.5 QUINCKE PK (NEEDLE) ×2 IMPLANT
NS IRRIG 1000ML POUR BTL (IV SOLUTION) ×2 IMPLANT
PACK LAMINECTOMY NEURO (CUSTOM PROCEDURE TRAY) ×2 IMPLANT
PAD ARMBOARD 7.5X6 YLW CONV (MISCELLANEOUS) ×4 IMPLANT
ROD 5.5X45MM SOLERA VOYAGER (Rod) ×2 IMPLANT
SCREW 5.5X40MM SOLERA VOYAGER (Screw) ×2 IMPLANT
SCREW MAS VOYAGER 6.5X40 (Screw) ×2 IMPLANT
SCREW SET 5.5/6.0MM SOLERA (Screw) ×4 IMPLANT
SPONGE T-LAP 4X18 ~~LOC~~+RFID (SPONGE) ×1 IMPLANT
SUT MNCRL AB 3-0 PS2 18 (SUTURE) ×2 IMPLANT
SUT VIC AB 0 CT1 18XCR BRD8 (SUTURE) IMPLANT
SUT VIC AB 0 CT1 8-18 (SUTURE)
SUT VIC AB 2-0 CP2 18 (SUTURE) ×2 IMPLANT
SYR 3ML LL SCALE MARK (SYRINGE) IMPLANT
TOWEL GREEN STERILE (TOWEL DISPOSABLE) ×2 IMPLANT
TOWEL GREEN STERILE FF (TOWEL DISPOSABLE) ×2 IMPLANT
TRAY FOL W/BAG SLVR 16FR STRL (SET/KITS/TRAYS/PACK) IMPLANT
TRAY FOLEY MTR SLVR 16FR STAT (SET/KITS/TRAYS/PACK) IMPLANT
TRAY FOLEY W/BAG SLVR 16FR LF (SET/KITS/TRAYS/PACK) ×1
WATER STERILE IRR 1000ML POUR (IV SOLUTION) ×2 IMPLANT

## 2021-12-08 NOTE — H&P (Signed)
Surgical H&P Update  HPI: 46 y.o. woman with a history of severe back and right L3 radicular pain. Workup showed an L3-4 isthmic mobile spondylolisthesis with bilateral foraminal stenosis. No changes in health since she was last seen. Still having severe pain and wishes to proceed with surgery.  PMHx:  Past Medical History:  Diagnosis Date   Acid reflux    Anxiety    Headache    Hypertension    Hyperthyroidism    FamHx:  Family History  Problem Relation Age of Onset   CAD Mother        Premature disease   Hypertension Sister    Breast cancer Maternal Grandmother    HIV Sister    Asthma Son    SocHx:  reports that she has been smoking cigarettes. She has never used smokeless tobacco. She reports current alcohol use of about 3.0 standard drinks per week. She reports that she does not use drugs.  Physical Exam: Strength 5/5 x4, SILTx4  Assesment/Plan: 46 y.o. woman with L3-4 isthmic spondylolisthesis, here for L3-4 MIS TLIF. Risks, benefits, and alternatives discussed and the patient would like to continue with surgery.  -OR today -3C post-op  Judith Part, MD 12/08/21 7:33 AM

## 2021-12-08 NOTE — Op Note (Signed)
PATIENT: Gina Coleman  DAY OF SURGERY: 12/08/21   PRE-OPERATIVE DIAGNOSIS:  L3-4 isthmic spondylolisthesis, lumbar radiculopathy   POST-OPERATIVE DIAGNOSIS:  Same   PROCEDURE:  Right L3-L4 minimally invasive transforaminal lumbar interbody fusion with bilateral L3-L4 pedicle screw placement   SURGEON:  Surgeon(s) and Role:    Judith Part, MD - Primary   ANESTHESIA: ETGA   BRIEF HISTORY: This is a 46 year old woman who presented with severe low back and right L3 radicular pain. The patient was found to have an L3-4 isthmic spondylolisthesis with resulting foraminal stenosis. Her symptoms were unfortunately not controlled by non-surgical treatment, I therefore recommended surgery. This was discussed with the patient as well as risks, benefits, and alternatives and wished to proceed with surgery.   OPERATIVE DETAIL:  The patient was taken to the operating room and placed on the OR table in the prone position. A formal time out was performed with two patient identifiers and confirmed the operative site. Anesthesia was induced by the anesthesia team. The operative site was marked, hair was clipped with surgical clippers, the area was then prepped and draped in a sterile fashion.   Fluoroscopy was used to localize the surgical level. The pedicles were marked and used to create skin incisions bilaterally. With fluoro guidance, Jamshidi needles were used to guide K-wires into the bilateral L3 and L4 pedicles. The K wires were then secured with hemostats and attention turned to the TLIF.  A [employer] tube was then docked to the right L3-4 facet through the same incision using fluoroscopy. As expected, there was a pars defect with the usual abnormal anatomy. A right L3-4 facetectomy was performed and the right L3 nerve root was decompressed along its entire course. The tube was wanded medially and the decompression was continued medially until reaching the contralateral foramen. The tube was  wanded back to the disc space. The disc space was identified, incised, and a discectomy was performed in the standard fashion. The disc space was, as expected, quite tight. I used blunt dilators to expand it as much as possible, but I could not get a great deal of distraction without putting the endplates at risk. I therefore prepped the endplates as far as I could reach then bone graft was packed into the disc space, and an expandable cage ([employer]) was packed with autograft and placed into the disc space with fluoroscopic confirmation. The tube was removed and hemostasis was obtained during its removal.   Using the previously placed K wires, a tap and then screw with tower were placed bilaterally at L3 and L4. A rod was sized and introduced on both sides, confirmed with fluoroscopy, then final tightened. Hemostasis was again confirmed for both incisions, they were copiously irrigated, and then closed in layers.    EBL:  74mL   DRAINS: none   SPECIMENS: none   Judith Part, MD 12/08/21 7:36 AM

## 2021-12-08 NOTE — Transfer of Care (Signed)
Immediate Anesthesia Transfer of Care Note  Patient: Gina Coleman  Procedure(s) Performed: Lumbar Three-Four Minimally Invasive Transforaminal Lumbar Interbody Fusion  Patient Location: PACU  Anesthesia Type:General  Level of Consciousness: awake and patient cooperative  Airway & Oxygen Therapy: Patient Spontanous Breathing  Post-op Assessment: Report given to RN and Post -op Vital signs reviewed and stable  Post vital signs: Reviewed and stable  Last Vitals:  Vitals Value Taken Time  BP 135/87 12/08/21 1058  Temp    Pulse 79 12/08/21 1100  Resp 23 12/08/21 1100  SpO2 100 % 12/08/21 1100  Vitals shown include unvalidated device data.  Last Pain:  Vitals:   12/08/21 0623  TempSrc:   PainSc: 0-No pain      Patients Stated Pain Goal: 0 (58/09/98 3382)  Complications: No notable events documented.

## 2021-12-08 NOTE — Anesthesia Procedure Notes (Signed)
Procedure Name: Intubation Date/Time: 12/08/2021 7:51 AM Performed by: Gwyndolyn Saxon, CRNA Pre-anesthesia Checklist: Patient identified, Emergency Drugs available, Suction available and Patient being monitored Patient Re-evaluated:Patient Re-evaluated prior to induction Oxygen Delivery Method: Circle system utilized Preoxygenation: Pre-oxygenation with 100% oxygen Induction Type: IV induction Ventilation: Mask ventilation without difficulty Laryngoscope Size: Mac and 3 Grade View: Grade II Tube type: Oral Tube size: 7.0 mm Number of attempts: 1 Airway Equipment and Method: Patient positioned with wedge pillow and Stylet Placement Confirmation: ETT inserted through vocal cords under direct vision, positive ETCO2 and breath sounds checked- equal and bilateral Secured at: 21 cm Tube secured with: Tape Dental Injury: Teeth and Oropharynx as per pre-operative assessment

## 2021-12-09 ENCOUNTER — Encounter (HOSPITAL_COMMUNITY): Payer: Self-pay | Admitting: Neurological Surgery

## 2021-12-09 MED ORDER — OXYCODONE HCL 5 MG PO TABS: 5.0000 mg | ORAL_TABLET | ORAL | 0 refills | Status: DC | PRN

## 2021-12-09 MED ORDER — DIAZEPAM 10 MG PO TABS: 10.0000 mg | ORAL_TABLET | Freq: Four times a day (QID) | ORAL | 0 refills | Status: DC | PRN

## 2021-12-09 NOTE — Progress Notes (Signed)
Neurosurgery Service Progress Note  Subjective: No acute events overnight, radicular pain completely resolved post-op, she's very happy   Objective: Vitals:   12/08/21 2317 12/09/21 0335 12/09/21 0753 12/09/21 1210  BP: 130/70 137/79 (!) 97/59 129/75  Pulse: 75 85 79 68  Resp: 20 18 18 18   Temp: 99.6 F (37.6 C) 98.6 F (37 C) 98.8 F (37.1 C) 98.7 F (37.1 C)  TempSrc: Oral Oral Oral Oral  SpO2: 99% 100% 100% 100%  Weight:      Height:        Physical Exam: Strength 5/5 x4, SILTx4, incisions c/d/i  Assessment & Plan: 46 y.o. woman s/p MIS TLIF, recovering well. -discharge home today  Judith Part  12/09/21 12:17 PM

## 2021-12-09 NOTE — TOC Progression Note (Signed)
Transition of Care [facility]) - Progression Note    Patient Details  Name: Gina Coleman MRN: [id number] Date of Birth: 1976/08/22  [facility] CM/SW Contact  Jacalyn Lefevre Edson Snowball, RN Phone Number: 12/09/2021, 11:23 AM  Clinical Narrative:         Transition of Care [facility]) Screening Note   Patient Details  Name: Gina Coleman Date of Birth: 1976/05/08   Nurse provided patient with walker and 3 in 1.    Transition of Care Department [facility]) has reviewed patient and no TOC needs have been identified at this time. We will continue to monitor patient advancement through interdisciplinary progression rounds. If new patient transition needs arise, please place a TOC consult.      Expected Discharge Plan and Services                                                 Social Determinants of Health (SDOH) Interventions    Readmission Risk Interventions No flowsheet data found.

## 2021-12-09 NOTE — Plan of Care (Signed)

## 2021-12-09 NOTE — Anesthesia Postprocedure Evaluation (Signed)
Anesthesia Post Note  Patient: Gina Coleman  Procedure(s) Performed: Lumbar Three-Four Minimally Invasive Transforaminal Lumbar Interbody Fusion     Patient location during evaluation: PACU Anesthesia Type: General Level of consciousness: awake and alert Pain management: pain level controlled Vital Signs Assessment: post-procedure vital signs reviewed and stable Respiratory status: spontaneous breathing, nonlabored ventilation, respiratory function stable and patient connected to nasal cannula oxygen Cardiovascular status: blood pressure returned to baseline and stable Postop Assessment: no apparent nausea or vomiting Anesthetic complications: no   No notable events documented.  Last Vitals:  Vitals:   12/09/21 0335 12/09/21 0753  BP: 137/79 (!) 97/59  Pulse: 85 79  Resp: 18 18  Temp: 37 C 37.1 C  SpO2: 100% 100%    Last Pain:  Vitals:   12/09/21 0753  TempSrc: Oral  PainSc:                  [place]

## 2021-12-09 NOTE — Progress Notes (Signed)
Patient awaiting transport via wheelchair by volunteer for discharge home; in no acute distress nor complaints of pain nor discomfort; incision on her back with liquid skin adhesive and is clean, dry and intact; room was checked and accounted for all her belongings; discharge instructions concerning her medications, follow up appointment, incision care and when to call the doctor as needed were all discussed with patient and her husband and both expressed understanding on the instructions given.

## 2021-12-09 NOTE — Evaluation (Addendum)
Occupational Therapy Evaluation Patient Details Name: Gina Coleman MRN: [id number] DOB: Jun 11, 1976 Today's Date: 12/09/2021   History of Present Illness Gina Coleman  46 year old woman who is now s/p lumbar Three-Four Minimally Invasive Transforaminal Lumbar Interbody Fusion on 1/9. PMHx: anxiety, headache, HTN, hyperthyroidism   Clinical Impression   Gina Coleman reports being indep PTA, she drives and works as an Administrator, arts. She lives in a 3rd floor apartment with her husband, there is no elevator access. Upon evaluation pt required up to min A for ADLs and close min guard for functional ambulation with RW. She verbalized understanding of back precautions and compensatory techniques for ADLs, but benefited from verbal cues to maintain during BADLs. Pt was limited by back pain but reported improvement in pain/tingling in RLE. Pt doe snot have further acute OT needs. Recommend d/c home with assistance from family as needed.      Recommendations for follow up therapy are one component of a multi-disciplinary discharge planning process, led by the attending physician.  Recommendations may be updated based on patient status, additional functional criteria and insurance authorization.   Follow Up Recommendations  No OT follow up    Assistance Recommended at Discharge Frequent or constant Supervision/Assistance     Functional Status Assessment  Patient has had a recent decline in their functional status and demonstrates the ability to make significant improvements in function in a reasonable and predictable amount of time.  Equipment Recommendations  BSC/3in1 (RW)    Recommendations for Other Services       Precautions / Restrictions Precautions Precautions: Fall;Back Precaution Booklet Issued: Yes (comment) Precaution Comments: no brace needed Restrictions Weight Bearing Restrictions: No      Mobility Bed Mobility Overal bed mobility: Needs Assistance Bed Mobility:  Rolling;Sidelying to Sit;Sit to Sidelying Rolling: Min guard Sidelying to sit: Min guard     Sit to sidelying: Min guard General bed mobility comments: verbal cues to log roll    Transfers Overall transfer level: Needs assistance Equipment used: Rolling walker (2 wheels) Transfers: Sit to/from Stand Sit to Stand: Min guard                  Balance Overall balance assessment: Needs assistance Sitting-balance support: Feet supported Sitting balance-Leahy Scale: Good     Standing balance support: Single extremity supported;During functional activity Standing balance-Leahy Scale: Fair                             ADL either performed or assessed with clinical judgement   ADL Overall ADL's : Needs assistance/impaired Eating/Feeding: Independent;Sitting   Grooming: Supervision/safety;Cueing for compensatory techniques   Upper Body Bathing: Supervision/ safety;Cueing for compensatory techniques   Lower Body Bathing: Minimal assistance;Sit to/from stand;Cueing for compensatory techniques   Upper Body Dressing : Supervision/safety;Sitting;Cueing for compensatory techniques   Lower Body Dressing: Minimal assistance;Cueing for compensatory techniques;Sit to/from stand   Toilet Transfer: Min guard;Rolling walker (2 wheels);Ambulation   Toileting- Clothing Manipulation and Hygiene: Supervision/safety;Cueing for compensatory techniques;Sitting/lateral lean       Functional mobility during ADLs: Min guard;Rolling walker (2 wheels) General ADL Comments: pt benfits from verbal cues to maintain back precautions during BADLs. Limited by back pain this date. denies any numbness/tingling in extremities     Vision Baseline Vision/History: 0 No visual deficits Ability to See in Adequate Light: 0 Adequate Vision Assessment?: No apparent visual deficits            Pertinent Vitals/Pain  Pain Assessment: Faces Faces Pain Scale: Hurts even more Pain Location: back/sx  site Pain Descriptors / Indicators: Discomfort;Grimacing Pain Intervention(s): Limited activity within patient's tolerance;Monitored during session     Hand Dominance Right   Extremity/Trunk Assessment Upper Extremity Assessment Upper Extremity Assessment: Overall WFL for tasks assessed   Lower Extremity Assessment Lower Extremity Assessment: Defer to PT evaluation   Cervical / Trunk Assessment Cervical / Trunk Assessment: Back Surgery   Communication Communication Communication: No difficulties   Cognition Arousal/Alertness: Awake/alert Behavior During Therapy: WFL for tasks assessed/performed Overall Cognitive Status: Within Functional Limits for tasks assessed             General Comments: verbalized great understanding of precautiosn after review     General Comments  VSS on RA, husband present at the end of the session            [place] expects to be discharged to:: Private residence Living Arrangements: Spouse/significant other;Children Available Help at Discharge: Family;Available 24 hours/day Type of Home: Apartment Home Access: Stairs to enter Entrance Stairs-Number of Steps: 3 flights - split with landing between Entrance Stairs-Rails: Right;Left Home Layout: One level     Bathroom Shower/Tub: Teacher, early years/pre: Standard     Home Equipment: None   Additional Comments: husband took off of work for as long as needed to assist pt      Prior Functioning/Environment Prior Level of Function : Independent/Modified Independent;Driving;Working/employed             Mobility Comments: no AD ADLs Comments: works as an Museum/gallery conservator Problem List: Decreased strength;Decreased range of motion;Decreased activity tolerance;Impaired balance (sitting and/or standing);Decreased knowledge of use of DME or AE;Decreased safety awareness;Decreased knowledge of precautions;Pain      OT Treatment/Interventions:       OT Goals(Current goals can be found in the care plan section) Acute Rehab OT Goals Patient Stated Goal: less pain OT Goal Formulation: All assessment and education complete, DC therapy Time For Goal Achievement: 12/09/21 Potential to Achieve Goals: Good  OT Frequency:         AM-PAC OT "6 Clicks" Daily Activity     Outcome Measure Help from another person eating meals?: None Help from another person taking care of personal grooming?: A Little Help from another person toileting, which includes using toliet, bedpan, or urinal?: A Little Help from another person bathing (including washing, rinsing, drying)?: A Little Help from another person to put on and taking off regular upper body clothing?: A Little Help from another person to put on and taking off regular lower body clothing?: A Little 6 Click Score: 19   End of Session Equipment Utilized During Treatment: Gait belt;Rolling walker (2 wheels) Nurse Communication: Mobility status  Activity Tolerance: Patient tolerated treatment well Patient left: in bed;with call bell/phone within reach;with family/visitor present  OT Visit Diagnosis: Unsteadiness on feet (R26.81);Muscle weakness (generalized) (M62.81);Pain                Time: 4166-0630 OT Time Calculation (min): 22 min Charges:  OT General Charges $OT Visit: 1 Visit OT Evaluation $OT Eval Moderate Complexity: 1 Mod   Gina Coleman A Gina Coleman 12/09/2021, 9:50 AM

## 2021-12-09 NOTE — Discharge Summary (Signed)
Discharge Summary  Date of Admission: 12/08/2021  Date of Discharge: 12/09/21  Attending Physician: Emelda Brothers, MD  Hospital Course: Patient was admitted following an uncomplicated B5-6 MIS TLIF for an isthmic spondylolisthesis with foraminal stenosis and radiculopathy. She was recovered in PACU and transferred to [facility]. Her symptoms were resolved immediately post-op, her hospital course was uncomplicated and the patient was discharged home on 12/09/21. She will follow up in clinic with me in 2 weeks.  Neurologic exam at discharge:  Strength 5/5 x4, SILTx4  Discharge diagnosis: Lumbar radiculopathy, lumbar isthmic spondylolisthesis  Judith Part, MD 12/09/21 12:20 PM

## 2021-12-11 ENCOUNTER — Encounter: Payer: Self-pay | Admitting: *Deleted

## 2021-12-11 DIAGNOSIS — Z2821 Immunization not carried out because of patient refusal: Secondary | ICD-10-CM | POA: Insufficient documentation

## 2021-12-16 ENCOUNTER — Ambulatory Visit (INDEPENDENT_AMBULATORY_CARE_PROVIDER_SITE_OTHER): Payer: BC Managed Care – PPO | Admitting: Internal Medicine

## 2022-01-16 ENCOUNTER — Ambulatory Visit: Payer: BC Managed Care – PPO | Admitting: Internal Medicine

## 2022-01-22 ENCOUNTER — Encounter: Payer: Self-pay | Admitting: Internal Medicine

## 2022-01-22 ENCOUNTER — Ambulatory Visit (INDEPENDENT_AMBULATORY_CARE_PROVIDER_SITE_OTHER): Payer: BC Managed Care – PPO | Admitting: Internal Medicine

## 2022-01-22 ENCOUNTER — Other Ambulatory Visit: Payer: Self-pay

## 2022-01-22 VITALS — BP 128/88 | HR 79 | Resp 18 | Ht 64.0 in | Wt 215.2 lb

## 2022-01-22 DIAGNOSIS — E042 Nontoxic multinodular goiter: Secondary | ICD-10-CM | POA: Diagnosis not present

## 2022-01-22 DIAGNOSIS — Z2821 Immunization not carried out because of patient refusal: Secondary | ICD-10-CM

## 2022-01-22 DIAGNOSIS — Z981 Arthrodesis status: Secondary | ICD-10-CM | POA: Diagnosis not present

## 2022-01-22 DIAGNOSIS — K219 Gastro-esophageal reflux disease without esophagitis: Secondary | ICD-10-CM | POA: Diagnosis not present

## 2022-01-22 NOTE — Progress Notes (Signed)
Acute Office Visit  Subjective:    Patient ID: Gina Coleman, female    DOB: [birth year], 46 y.o.   MRN: [id number]  Chief Complaint  Patient presents with   Neck Pain    Pt has been having pain on left neck and underneath also feels like something stuck in her throat all of this has happened since back surgery in jan     HPI Patient is in today for c/o neck/throat pain on left side, which has been worse since her back surgery -L3-4 fusion on 12/08/21.  She complains of sore throat/irritation and also has mild dysphagia.  She takes Pepcid and as needed Nexium for GERD as well. Of note, she has history of multinodular goiter, which also needs repeat US thyroid.  She denies any dyspnea or wheezing currently.  Past Medical History:  Diagnosis Date   Acid reflux    Anxiety    Headache    Hypertension    Hyperthyroidism     Past Surgical History:  Procedure Laterality Date   BIOPSY  06/05/2021   Procedure: BIOPSY;  Surgeon: Rogene Houston, MD;  [facility];  Service: Endoscopy;;   COLONOSCOPY N/A 06/05/2021   rehman:The examined portion of the ileum was normal. one 35mm polyp in cecum (tubular adenoma). external hemorrhoids   ESOPHAGOGASTRODUODENOSCOPY N/A 06/05/2021   Rehman:Normal hypopharynx.normal esophagus, z line irregular 36 cm from incisors, gastritics with reactive gastropathy, normal duodenal bulb and second portion of duodenum   FOOT SURGERY Right    LEG SURGERY Right    TRANSFORAMINAL LUMBAR INTERBODY FUSION W/ MIS 1 LEVEL N/A 12/08/2021   Procedure: Lumbar Three-Four Minimally Invasive Transforaminal Lumbar Interbody Fusion;  Surgeon: Judith Part, MD;  Location: [facility];  Service: Neurosurgery;  Laterality: N/A;  Lumbar Three-Four Minimally Invasive Transforaminal Lumbar Interbody Fusion   TUBAL LIGATION      Family History  Problem Relation Age of Onset   CAD Mother        Premature disease   Hypertension Sister    Breast cancer Maternal  Grandmother    HIV Sister    Asthma Son     Social History   Socioeconomic History   Marital status: Married    Spouse name: Not on file   Number of children: Not on file   Years of education: Not on file   Highest education level: Not on file  Occupational History   Not on file  Tobacco Use   Smoking status: Former    Types: Cigarettes   Smokeless tobacco: Never  Vaping Use   Vaping Use: Never used  Substance and Sexual Activity   Alcohol use: Yes    Alcohol/week: 3.0 standard drinks    Types: 3 Glasses of wine per week    Comment: social    Drug use: Never   Sexual activity: Yes    Birth control/protection: Surgical    Comment: tubal  Other Topics Concern   Not on file  Social History Narrative   Not on file   Social Determinants of Health   Financial Resource Strain: Low Risk    Difficulty of Paying Living Expenses: Not hard at all  Food Insecurity: No Food Insecurity   Worried About Charity fundraiser in the Last Year: Never true   [place] in the Last Year: Never true  Transportation Needs: No Transportation Needs   Lack of Transportation (Medical): No   Lack of Transportation (Non-Medical): No  Physical Activity:  Inactive   Days of Exercise per Week: 0 days   Minutes of Exercise per Session: 0 min  Stress: No Stress Concern Present   Feeling of Stress : Only a little  Social Connections: Moderately Isolated   Frequency of Communication with Friends and Family: More than three times a week   Frequency of Social Gatherings with Friends and Family: Once a week   Attends Religious Services: Never   Marine scientist or Organizations: No   Attends Music therapist: Never   Marital Status: Married  Human resources officer Violence: Not At Risk   Fear of Current or Ex-Partner: No   Emotionally Abused: No   Physically Abused: No   Sexually Abused: No    Outpatient Medications Prior to Visit  Medication Sig Dispense Refill   amLODipine  (NORVASC) 5 MG tablet Take 1 tablet (5 mg total) by mouth daily. 90 tablet 1   diazepam (VALIUM) 10 MG tablet Take 1 tablet (10 mg total) by mouth every 6 (six) hours as needed for muscle spasms. 30 tablet 0   esomeprazole (NEXIUM) 40 MG packet Take 40 mg by mouth 2 (two) times daily. (Patient taking differently: Take 40 mg by mouth daily as needed (heartburn).) 120 each 5   famotidine (PEPCID) 40 MG tablet Take 1 tablet (40 mg total) by mouth daily. In the evening 90 tablet 3   Flaxseed, Linseed, (FLAXSEED OIL PO) Take 1 capsule by mouth daily.     gabapentin (NEURONTIN) 100 MG capsule Take 100 mg by mouth at bedtime.     methimazole (TAPAZOLE) 5 MG tablet Take 1 tablet (5 mg total) by mouth daily. 90 tablet 2   Omega-3 Fatty Acids (FISH OIL) 1200 MG CAPS Take 1,200 mg by mouth daily.     triamcinolone cream (KENALOG) 0.1 % Apply 1 application topically daily as needed (Eczema).     vitamin B-6 (PYRIDOXINE) 25 MG tablet Take 1 tid (Patient taking differently: Take 25 mg by mouth daily.)     oxyCODONE (OXY IR/ROXICODONE) 5 MG immediate release tablet Take 1 tablet (5 mg total) by mouth every 4 (four) hours as needed (pain). (Patient not taking: Reported on 01/22/2022) 30 tablet 0   No facility-administered medications prior to visit.    Allergies  Allergen Reactions   Latex Hives   Other Rash     GLOVE POWDER     Flexeril [Cyclobenzaprine] Palpitations    Happened with 5 mg dose    Review of Systems  Constitutional:  Negative for chills and fever.  HENT:  Negative for congestion, sinus pressure, sinus pain and sore throat.   Eyes:  Negative for pain and discharge.  Respiratory:  Negative for cough and shortness of breath.   Cardiovascular:  Negative for chest pain and palpitations.  Gastrointestinal:  Positive for abdominal pain (epigastric). Negative for diarrhea, nausea and vomiting.  Endocrine: Negative for polydipsia and polyuria.  Genitourinary:  Negative for dysuria and  hematuria.  Musculoskeletal:  Positive for back pain and neck pain. Negative for neck stiffness.  Skin:  Negative for rash.  Neurological:  Negative for dizziness and weakness.  Psychiatric/Behavioral:  Negative for agitation and behavioral problems.       Objective:    Physical Exam Vitals reviewed.  Constitutional:      General: She is not in acute distress.    Appearance: She is obese. She is not diaphoretic.  HENT:     Head: Normocephalic and atraumatic.     Nose:  Nose normal. No congestion.     Mouth/Throat:     Mouth: Mucous membranes are moist.     Pharynx: No posterior oropharyngeal erythema.  Eyes:     General: No scleral icterus.    Extraocular Movements: Extraocular movements intact.  Neck:     Thyroid: Thyromegaly present. No thyroid tenderness.  Cardiovascular:     Rate and Rhythm: Normal rate and regular rhythm.     Pulses: Normal pulses.     Heart sounds: Normal heart sounds. No murmur heard.   No friction rub. No gallop.  Pulmonary:     Breath sounds: Normal breath sounds. No wheezing or rales.  Abdominal:     Palpations: Abdomen is soft.     Tenderness: There is no abdominal tenderness.  Musculoskeletal:     Cervical back: Normal range of motion. No rigidity.     Right lower leg: No edema.     Left lower leg: No edema.  Skin:    General: Skin is warm.     Findings: No rash.  Neurological:     General: No focal deficit present.     Mental Status: She is alert and oriented to person, place, and time.     Sensory: No sensory deficit.     Motor: No weakness.  Psychiatric:        Mood and Affect: Mood normal.        Behavior: Behavior normal.    BP 128/88 (BP Location: Right Arm, Patient Position: Sitting, Cuff Size: Normal)    Pulse 79    Resp 18    Ht 5\' 4"  (1.626 m)    Wt 215 lb 3.2 oz (97.6 kg)    SpO2 99%    BMI 36.94 kg/m  Wt Readings from Last 3 Encounters:  01/22/22 215 lb 3.2 oz (97.6 kg)  12/08/21 219 lb (99.3 kg)  12/05/21 214 lb 3.2 oz  (97.2 kg)        Assessment & Plan:   Problem List Items Addressed This Visit       Digestive   GERD (gastroesophageal reflux disease)    On Nexium 40 mg twice daily, takes it as needed only On Pepcid 40 mg daily         Endocrine   Multinodular goiter - Primary    Complains of throat discomfort, especially since surgery Could be due to throat irritation from intubation, advised to perform warm water gargling and use phenol throat spray Has history of multinodular goiter, followed by endocrinology Needs repeat US thyroid, ordered today      Relevant Orders   US THYROID     Other   S/P lumbar fusion    Recently had L3-4 minimally invasive interbody fusion Back and leg pain better now, followed by spine surgery      Other Visit Diagnoses     Refused influenza vaccine            No orders of the defined types were placed in this encounter.    Lindell Spar, MD

## 2022-01-22 NOTE — Assessment & Plan Note (Signed)
Complains of throat discomfort, especially since surgery Could be due to throat irritation from intubation, advised to perform warm water gargling and use phenol throat spray Has history of multinodular goiter, followed by endocrinology Needs repeat US thyroid, ordered today

## 2022-01-22 NOTE — Assessment & Plan Note (Signed)
Recently had L3-4 minimally invasive interbody fusion Back and leg pain better now, followed by spine surgery

## 2022-01-22 NOTE — Assessment & Plan Note (Signed)
On Nexium 40 mg twice daily, takes it as needed only On Pepcid 40 mg daily

## 2022-01-22 NOTE — Patient Instructions (Signed)
Please continue to take medications as prescribed.  You are being scheduled to get US thyroid.

## 2022-01-23 ENCOUNTER — Ambulatory Visit: Payer: BC Managed Care – PPO | Admitting: Internal Medicine

## 2022-02-05 ENCOUNTER — Other Ambulatory Visit: Payer: Self-pay

## 2022-02-05 ENCOUNTER — Ambulatory Visit (HOSPITAL_COMMUNITY)
Admission: RE | Admit: 2022-02-05 | Discharge: 2022-02-05 | Disposition: A | Discharge status: Home / Self Care | Payer: BC Managed Care – PPO | Source: Ambulatory Visit | Attending: Internal Medicine | Admitting: Internal Medicine

## 2022-02-05 DIAGNOSIS — E042 Nontoxic multinodular goiter: Secondary | ICD-10-CM | POA: Diagnosis not present

## 2022-02-05 DIAGNOSIS — E041 Nontoxic single thyroid nodule: Secondary | ICD-10-CM | POA: Diagnosis not present

## 2022-02-09 ENCOUNTER — Ambulatory Visit: Payer: BC Managed Care – PPO | Admitting: Internal Medicine

## 2022-02-11 ENCOUNTER — Ambulatory Visit: Payer: BC Managed Care – PPO | Admitting: Internal Medicine

## 2022-02-11 ENCOUNTER — Encounter: Payer: Self-pay | Admitting: Internal Medicine

## 2022-02-11 ENCOUNTER — Other Ambulatory Visit: Payer: Self-pay

## 2022-02-11 VITALS — BP 132/82 | HR 81 | Ht 64.0 in | Wt 211.0 lb

## 2022-02-11 DIAGNOSIS — E0590 Thyrotoxicosis, unspecified without thyrotoxic crisis or storm: Secondary | ICD-10-CM

## 2022-02-11 DIAGNOSIS — E042 Nontoxic multinodular goiter: Secondary | ICD-10-CM

## 2022-02-11 LAB — TSH: TSH: 1.23 u[IU]/mL (ref 0.35–5.50)

## 2022-02-11 LAB — T4, FREE: Free T4: 0.79 ng/dL (ref 0.60–1.60)

## 2022-02-11 MED ORDER — METHIMAZOLE 5 MG PO TABS: 5.0000 mg | ORAL_TABLET | Freq: Every day | ORAL | 2 refills | Status: DC

## 2022-02-11 NOTE — Progress Notes (Signed)
? ?Name: Gina Coleman  ?MRN/ DOB: [id number], 1976-10-30    ?Age/ Sex: 46 y.o., female   ? ? ?PCP: Lindell Spar, MD   ?Reason for Endocrinology Evaluation: MNG  ?   ?Initial Endocrinology Clinic Visit: 07/12/2019  ? ? ?PATIENT IDENTIFIER: [name] is a 46 y.o., female with a past medical history of HTN. She has followed with [facility] since 07/12/2019 for consultative assistance with management of her 07/12/2019.  ? ?HISTORICAL SUMMARY: The patient was first  noted to have a low TSH at 0.399 uIU/mL during an evaluation for an enlarged cervical lymph node in 05/2019. She was started on Abx at the time.  Repeat TFT's were normal in 07/2019 . Ultrasound showed MNG . She is S/P benign FNA of the RUP nodule 08/2019 ? ? ?TRAb was undetectable  ?Methimazole started 03/2020 due to symptoms of hyperthyroidism  ? ? ?No FH of thyroid disorder ? ?SUBJECTIVE:  ? ? ?Today (02/11/2022):  46/ Mr. Coleman is here for a follow up on subclinical hyperthyroidism. S/P FNA of the RUP nodule with benign cytology in 08/2019 ? ?Repeat ultrasound last week showed interval growth meeting FNA of the isthmic nodule #1 ? ?She was having excessive " frog feeling " which triggered the ultrasound  ?Denies dysphagia but has occasional intermittent pain  ? ?Denies diarrhea or constipation  ?She has GERD which has improved  ?Denies palpitations  ? ? ?She is on Methimazole 5 mg, once daily  ? ? ? ?HISTORY:  ?Past Medical History:  ?Past Medical History:  ?Diagnosis Date  ? Acid reflux   ? Anxiety   ? Headache   ? Hypertension   ? Hyperthyroidism   ? ?Past Surgical History:  ?Past Surgical History:  ?Procedure Laterality Date  ? BIOPSY  06/05/2021  ? Procedure: BIOPSY;  Surgeon: Rogene Houston, MD;  [facility];  Service: Endoscopy;;  ? COLONOSCOPY N/A 06/05/2021  ? rehman:The examined portion of the ileum was normal. one 60m polyp in cecum (tubular adenoma). external hemorrhoids  ? ESOPHAGOGASTRODUODENOSCOPY N/A  06/05/2021  ? Rehman:Normal hypopharynx.normal esophagus, z line irregular 36 cm from incisors, gastritics with reactive gastropathy, normal duodenal bulb and second portion of duodenum  ? FOOT SURGERY Right   ? LEG SURGERY Right   ? TRANSFORAMINAL LUMBAR INTERBODY FUSION W/ MIS 1 LEVEL N/A 12/08/2021  ? Procedure: Lumbar Three-Four Minimally Invasive Transforaminal Lumbar Interbody Fusion;  Surgeon: OJudith Part MD;  Location: [facility]  Service: Neurosurgery;  Laterality: N/A;  Lumbar Three-Four Minimally Invasive Transforaminal Lumbar Interbody Fusion  ? TUBAL LIGATION    ? ?Social History:  reports that she has quit smoking. Her smoking use included cigarettes. She has never used smokeless tobacco. She reports current alcohol use of about 3.0 standard drinks per week. She reports that she does not use drugs. ?Family History:  ?Family History  ?Problem Relation Age of Onset  ? CAD Mother   ?     Premature disease  ? Hypertension Sister   ? Breast cancer Maternal Grandmother   ? HIV Sister   ? Asthma Son   ? ? ? ?HOME MEDICATIONS: ?Allergies as of 02/11/2022   ? ?   Reactions  ? Latex Hives  ? Other Rash  ? GLOVE POWDER   ? Flexeril [cyclobenzaprine] Palpitations  ? Happened with 5 mg dose  ? ?  ? ?  ?Medication List  ?  ? ?  ? Accurate as of February 11, 2022  7:14  AM. If you have any questions, ask your nurse or doctor.  ?  ?  ? ?  ? ?amLODipine 5 MG tablet ?Commonly known as: NORVASC ?Take 1 tablet (5 mg total) by mouth daily. ?  ?diazepam 10 MG tablet ?Commonly known as: VALIUM ?Take 1 tablet (10 mg total) by mouth every 6 (six) hours as needed for muscle spasms. ?  ?esomeprazole 40 MG packet ?Commonly known as: NexIUM ?Take 40 mg by mouth 2 (two) times daily. ?What changed:  ?when to take this ?reasons to take this ?  ?famotidine 40 MG tablet ?Commonly known as: PEPCID ?Take 1 tablet (40 mg total) by mouth daily. In the evening ?  ?Fish Oil 1200 MG Caps ?Take 1,200 mg by mouth daily. ?  ?FLAXSEED OIL PO ?Take  1 capsule by mouth daily. ?  ?gabapentin 100 MG capsule ?Commonly known as: NEURONTIN ?Take 100 mg by mouth at bedtime. ?  ?methimazole 5 MG tablet ?Commonly known as: TAPAZOLE ?Take 1 tablet (5 mg total) by mouth daily. ?  ?triamcinolone cream 0.1 % ?Commonly known as: KENALOG ?Apply 1 application topically daily as needed (Eczema). ?  ?vitamin B-6 25 MG tablet ?Commonly known as: pyridOXINE ?Take 1 tid ?What changed:  ?how much to take ?how to take this ?when to take this ?additional instructions ?  ? ?  ? ? ? ? ?OBJECTIVE:  ? ?PHYSICAL EXAM: ?VS: There were no vitals taken for this visit.  ? ?EXAM: ?General: Pt appears well and is in NAD  ?Neck: General: Supple without adenopathy. ?Thyroid: Right thyroid  nodule appreciated.   ?Lungs: Clear with good BS bilat with no rales, rhonchi, or wheezes  ?Heart: Auscultation: RRR.  ?Abdomen: Normoactive bowel sounds, soft, nontender, without masses or organomegaly palpable  ?Extremities:  ?BL LE: No pretibial edema normal ROM and strength.  ?Mental Status: Judgment, insight: Intact ?Orientation: Oriented to time, place, and person ?Mood and affect: No depression, anxiety, or agitation  ? ? ? ?DATA REVIEWED: ? ? Latest Reference Range & Units 02/11/22 10:50  ?TSH 0.35 - 5.50 uIU/mL 1.23  ?T4,Free(Direct) 0.60 - 1.60 ng/dL 0.79  ? ? ? ?Results for Gina Coleman, Gina Coleman (MRN [id number]) as of 07/18/2021 13:46 ? Ref. Range 07/16/2021 03:33  ?Sodium Latest Ref Range: 135 - 145 mmol/L 140  ?Potassium Latest Ref Range: 3.5 - 5.1 mmol/L 3.7  ?Chloride Latest Ref Range: 98 - 111 mmol/L 104  ?CO2 Latest Ref Range: 22 - 32 mmol/L 28  ?Glucose Latest Ref Range: 70 - 99 mg/dL 112 (H)  ?BUN Latest Ref Range: 6 - 20 mg/dL 13  ?Creatinine Latest Ref Range: 0.44 - 1.00 mg/dL 0.66  ?Calcium Latest Ref Range: 8.9 - 10.3 mg/dL 8.1 (L)  ?Anion gap Latest Ref Range: 5 - 15  8  ?Alkaline Phosphatase Latest Ref Range: 38 - 126 U/L 99  ?Albumin Latest Ref Range: 3.5 - 5.0 g/dL 3.4 (L)  ?AST Latest Ref  Range: 15 - 41 U/L 15  ?ALT Latest Ref Range: 0 - 44 U/L 21  ?Total Protein Latest Ref Range: 6.5 - 8.1 g/dL 6.9  ?Total Bilirubin Latest Ref Range: 0.3 - 1.2 mg/dL 0.4  ?GFR, Estimated Latest Ref Range: >60 mL/min >60  ?Troponin I (High Sensitivity) Latest Ref Range: <18 ng/L <2  ?WBC Latest Ref Range: 4.0 - 10.5 K/uL 6.4  ?RBC Latest Ref Range: 3.87 - 5.11 MIL/uL 4.07  ?Hemoglobin Latest Ref Range: 12.0 - 15.0 g/dL 11.7 (L)  ?HCT Latest Ref Range: 36.0 - 46.0 %  36.7  ?MCV Latest Ref Range: 80.0 - 100.0 fL 90.2  ?MCH Latest Ref Range: 26.0 - 34.0 pg 28.7  ?MCHC Latest Ref Range: 30.0 - 36.0 g/dL 31.9  ?RDW Latest Ref Range: 11.5 - 15.5 % 13.2  ?Platelets Latest Ref Range: 150 - 400 K/uL 230  ?nRBC Latest Ref Range: 0.0 - 0.2 % 0.0  ?Neutrophils Latest Units: % 53  ?Lymphocytes Latest Units: % 32  ?Monocytes Relative Latest Units: % 10  ?Eosinophil Latest Units: % 5  ?Basophil Latest Units: % 0  ?Immature Granulocytes Latest Units: % 0  ?NEUT# Latest Ref Range: 1.7 - 7.7 K/uL 3.4  ?Lymphocyte # Latest Ref Range: 0.7 - 4.0 K/uL 2.0  ?Monocyte # Latest Ref Range: 0.1 - 1.0 K/uL 0.6  ?Eosinophils Absolute Latest Ref Range: 0.0 - 0.5 K/uL 0.3  ?Basophils Absolute Latest Ref Range: 0.0 - 0.1 K/uL 0.0  ?Abs Immature Granulocytes Latest Ref Range: 0.00 - 0.07 K/uL 0.01  ? ? ? ? ?Thyroid Ultrasound 02/05/2022 ?  ?Estimated total number of nodules >/= 1 cm: 2 ?  ?Number of spongiform nodules >/=  2 cm not described below (TR1): 0 ?  ?Number of mixed cystic and solid nodules >/= 1.5 cm not described ?below ([place]): 0 ?  ?_________________________________________________________ ?  ?Nodule labeled 1 in the thyroid isthmus is a solid isoechoic TR 3 ?nodule that measures 2.6 x 2.2 x 1.4 cm, previously measuring up to ?a maximum dimension of 1.9 cm when measured in a similar fashion. It ?demonstrates interval growth since January 2022, and biopsy is ?recommended. ?  ?Nodule labeled 2 is a solid hypoechoic nodule in the right  thyroid ?lobe measuring 3.9 x 3.1 x 1.5 cm, which remains similar in size and ?morphology previously measuring 3.9 cm in January 2022. This nodule ?was previously biopsied. ?  ?IMPRESSION: ?1. Multinodular thy

## 2022-02-16 ENCOUNTER — Other Ambulatory Visit: Payer: Self-pay

## 2022-02-16 ENCOUNTER — Ambulatory Visit (INDEPENDENT_AMBULATORY_CARE_PROVIDER_SITE_OTHER): Payer: BC Managed Care – PPO

## 2022-02-16 DIAGNOSIS — Z0000 Encounter for general adult medical examination without abnormal findings: Secondary | ICD-10-CM | POA: Diagnosis not present

## 2022-02-16 NOTE — Progress Notes (Signed)
? ?Subjective:  ? Gina Coleman is a 46 y.o. female who presents for Medicare Annual (Subsequent) preventive examination. ?I connected with  Gina Coleman on 02/16/22 by a audio enabled telemedicine application and verified that I am speaking with the correct person using two identifiers. ? ?Patient Location: Home ? ?Provider Location: Office/Clinic ? ?I discussed the limitations of evaluation and management by telemedicine. The patient expressed understanding and agreed to proceed.  ?Review of Systems    ? ?  ? ?   ?Objective:  ?  ?There were no vitals filed for this visit. ?There is no height or weight on file to calculate BMI. ? ?Advanced Directives 12/05/2021 07/16/2021 06/05/2021 05/01/2021 02/12/2021 10/31/2020 09/08/2020  ?Does Patient Have a Medical Advance Directive? No No No No No No No  ?Would patient like information on creating a medical advance directive? No - Patient declined No - Patient declined No - Patient declined No - Patient declined No - Patient declined - -  ? ? ?Current Medications (verified) ?Outpatient Encounter Medications as of 02/16/2022  ?Medication Sig  ? amLODipine (NORVASC) 5 MG tablet Take 1 tablet (5 mg total) by mouth daily.  ? diazepam (VALIUM) 10 MG tablet Take 1 tablet (10 mg total) by mouth every 6 (six) hours as needed for muscle spasms.  ? esomeprazole (NEXIUM) 40 MG packet Take 40 mg by mouth 2 (two) times daily. (Patient taking differently: Take 40 mg by mouth daily as needed (heartburn).)  ? famotidine (PEPCID) 40 MG tablet Take 1 tablet (40 mg total) by mouth daily. In the evening  ? Flaxseed, Linseed, (FLAXSEED OIL PO) Take 1 capsule by mouth daily.  ? gabapentin (NEURONTIN) 100 MG capsule Take 100 mg by mouth at bedtime.  ? methimazole (TAPAZOLE) 5 MG tablet Take 1 tablet (5 mg total) by mouth daily.  ? Omega-3 Fatty Acids (FISH OIL) 1200 MG CAPS Take 1,200 mg by mouth daily.  ? triamcinolone cream (KENALOG) 0.1 % Apply 1 application topically daily as needed (Eczema).   ? vitamin B-6 (PYRIDOXINE) 25 MG tablet Take 1 tid (Patient taking differently: Take 25 mg by mouth daily.)  ? [DISCONTINUED] lisinopril (ZESTRIL) 5 MG tablet Take by mouth.  ? ?No facility-administered encounter medications on file as of 02/16/2022.  ? ? ?Allergies (verified) ?Latex, Other, and Flexeril [cyclobenzaprine]  ? ?History: ?Past Medical History:  ?Diagnosis Date  ? Acid reflux   ? Anxiety   ? Headache   ? Hypertension   ? Hyperthyroidism   ? ?Past Surgical History:  ?Procedure Laterality Date  ? BIOPSY  06/05/2021  ? Procedure: BIOPSY;  Surgeon: Rogene Houston, MD;  [facility];  Service: Endoscopy;;  ? COLONOSCOPY N/A 06/05/2021  ? rehman:The examined portion of the ileum was normal. one 17m polyp in cecum (tubular adenoma). external hemorrhoids  ? ESOPHAGOGASTRODUODENOSCOPY N/A 06/05/2021  ? Rehman:Normal hypopharynx.normal esophagus, z line irregular 36 cm from incisors, gastritics with reactive gastropathy, normal duodenal bulb and second portion of duodenum  ? FOOT SURGERY Right   ? LEG SURGERY Right   ? TRANSFORAMINAL LUMBAR INTERBODY FUSION W/ MIS 1 LEVEL N/A 12/08/2021  ? Procedure: Lumbar Three-Four Minimally Invasive Transforaminal Lumbar Interbody Fusion;  Surgeon: OJudith Part MD;  [place]  Service: Neurosurgery;  Laterality: N/A;  Lumbar Three-Four Minimally Invasive Transforaminal Lumbar Interbody Fusion  ? TUBAL LIGATION    ? ?Family History  ?Problem Relation Age of Onset  ? CAD Mother   ?     Premature  disease  ? Hypertension Sister   ? Breast cancer Maternal Grandmother   ? HIV Sister   ? Asthma Son   ? ?Social History  ? ?Socioeconomic History  ? Marital status: Married  ?  Spouse name: Not on file  ? Number of children: Not on file  ? Years of education: Not on file  ? Highest education level: Not on file  ?Occupational History  ? Not on file  ?Tobacco Use  ? Smoking status: Former  ?  Types: Cigarettes  ? Smokeless tobacco: Never  ?Vaping Use  ? Vaping  Use: Never used  ?Substance and Sexual Activity  ? Alcohol use: Yes  ?  Alcohol/week: 3.0 standard drinks  ?  Types: 3 Glasses of wine per week  ?  Comment: social   ? Drug use: Never  ? Sexual activity: Yes  ?  Birth control/protection: Surgical  ?  Comment: tubal  ?Other Topics Concern  ? Not on file  ?Social History Narrative  ? Not on file  ? ?Social Determinants of Health  ? ?Financial Resource Strain: Not on file  ?Food Insecurity: Not on file  ?Transportation Needs: Not on file  ?Physical Activity: Not on file  ?Stress: Not on file  ?Social Connections: Not on file  ? ? ?Tobacco Counseling ?Counseling given: Not Answered ? ? ?Clinical Intake: ? ?  ? ?  ? ?  ? ?  ? ?  ? ?Diabetic?no ? ?  ? ?  ? ? ?Activities of Daily Living ?In your present state of health, do you have any difficulty performing the following activities: 12/05/2021  ?Hearing? N  ?Vision? N  ?Difficulty concentrating or making decisions? N  ?Walking or climbing stairs? Y  ?Dressing or bathing? N  ?Doing errands, shopping? N  ?Some recent data might be hidden  ? ? ?Patient Care Team: ?Lindell Spar, MD as PCP - General (Internal Medicine) ?Satira Sark, MD as PCP - Cardiology (Cardiology) ? ?Indicate any recent Medical Services you may have received from other than [facility] providers in the past year (date may be approximate). ? ?   ?Assessment:  ? This is a routine wellness examination for Gina Coleman. ? ?Hearing/Vision screen ?No results found. ? ?Dietary issues and exercise activities discussed: ?  ? ? Goals Addressed   ?None ?  ? ?Depression Screen ?PHQ 2/9 Scores 01/22/2022 10/09/2021 07/08/2021 05/13/2021 02/12/2021 02/11/2021 12/09/2020  ?PHQ - 2 Score 0 0 0 2 0 0 0  ?PHQ- 9 Score - - - 5 - - -  ?  ?Fall Risk ?Fall Risk  01/22/2022 10/09/2021 10/02/2021 07/09/2021 07/08/2021  ?Falls in the past year? 0 0 0 0 0  ?Number falls in past yr: 0 0 0 - 0  ?Injury with Fall? 0 0 0 - 0  ?Risk for fall due to : No Fall Risks No Fall Risks No Fall Risks - No Fall  Risks  ?Follow up Falls evaluation completed Falls evaluation completed - - Falls evaluation completed  ? ? ?FALL RISK PREVENTION PERTAINING TO THE HOME: ? ?Any stairs in or around the home? Yes  ?If so, are there any without handrails? No  ?Home free of loose throw rugs in walkways, pet beds, electrical cords, etc? Yes  ?Adequate lighting in your home to reduce risk of falls? Yes  ? ?ASSISTIVE DEVICES UTILIZED TO PREVENT FALLS: ? ?Life alert? No  ?Use of a cane, walker or w/c? No  ?Grab bars in the bathroom? No  ?Shower  chair or bench in shower? No  ?Elevated toilet seat or a handicapped toilet? No  ? ?TIMED UP AND GO: ? ?Was the test performed? No .  ?Length of time to ambulate 10 feet:  sec.  ? ? ? ?Cognitive Function: ?  ?  ?6CIT Screen 02/12/2021  ?What Year? 0 points  ?What month? 0 points  ?What time? 0 points  ?Count back from 20 0 points  ?Months in reverse 0 points  ?Repeat phrase 0 points  ?Total Score 0  ? ? ?Immunizations ?Immunization History  ?Administered Date(s) Administered  ? Moderna Sars-Covid-2 Vaccination 06/10/2020, 07/08/2020  ? PPD Test 07/04/2018, 07/04/2018  ? ? ?TDAP status: Due, Education has been provided regarding the importance of this vaccine. Advised may receive this vaccine at local pharmacy or Health Dept. Aware to provide a copy of the vaccination record if obtained from local pharmacy or Health Dept. Verbalized acceptance and understanding. ? ?Flu Vaccine status: Declined, Education has been provided regarding the importance of this vaccine but patient still declined. Advised may receive this vaccine at local pharmacy or Health Dept. Aware to provide a copy of the vaccination record if obtained from local pharmacy or Health Dept. Verbalized acceptance and understanding. ? ? ? ?Covid-19 vaccine status: Declined, Education has been provided regarding the importance of this vaccine but patient still declined. Advised may receive this vaccine at local pharmacy or Health Dept.or  vaccine clinic. Aware to provide a copy of the vaccination record if obtained from local pharmacy or Health Dept. Verbalized acceptance and understanding. ? ?Qualifies for Shingles Vaccine? No   ?Zostavax c

## 2022-02-16 NOTE — Patient Instructions (Signed)
?  Gina Coleman , ?Thank you for taking time to come for your Medicare Wellness Visit. I appreciate your ongoing commitment to your health goals. Please review the following plan we discussed and let me know if I can assist you in the future.  ? ?These are the goals we discussed: ? Goals   ? ?  DIET - INCREASE WATER INTAKE   ?  Increase physical activity   ?  Walk more ?  ?  Patient Stated   ?  Patient states that she currently has no goals ?  ? ?  ?  ?This is a list of the screening recommended for you and due dates:  ?Health Maintenance  ?Topic Date Due  ? HIV Screening  Never done  ? Hepatitis C Screening: USPSTF Recommendation to screen - Ages 23-79 yo.  Never done  ? Tetanus Vaccine  Never done  ? Flu Shot  02/27/2022*  ? Pap Smear  06/15/2023  ? Colon Cancer Screening  06/05/2026  ? HPV Vaccine  Aged Out  ? COVID-19 Vaccine  Discontinued  ?*Topic was postponed. The date shown is not the original due date.  ?  ?

## 2022-02-25 ENCOUNTER — Other Ambulatory Visit: Payer: Self-pay

## 2022-02-25 ENCOUNTER — Encounter (HOSPITAL_COMMUNITY): Payer: Self-pay

## 2022-02-25 ENCOUNTER — Ambulatory Visit (HOSPITAL_COMMUNITY)
Admission: RE | Admit: 2022-02-25 | Discharge: 2022-02-25 | Disposition: A | Discharge status: Home / Self Care | Payer: BC Managed Care – PPO | Source: Ambulatory Visit | Attending: Internal Medicine | Admitting: Internal Medicine

## 2022-02-25 DIAGNOSIS — E041 Nontoxic single thyroid nodule: Secondary | ICD-10-CM | POA: Diagnosis not present

## 2022-02-25 DIAGNOSIS — E0789 Other specified disorders of thyroid: Secondary | ICD-10-CM | POA: Diagnosis not present

## 2022-02-25 DIAGNOSIS — D440 Neoplasm of uncertain behavior of thyroid gland: Secondary | ICD-10-CM | POA: Diagnosis not present

## 2022-02-25 DIAGNOSIS — E042 Nontoxic multinodular goiter: Secondary | ICD-10-CM

## 2022-02-25 MED ORDER — LIDOCAINE HCL (PF) 2 % IJ SOLN
10.0000 mL | Freq: Once | INTRAMUSCULAR | Status: AC
  Administered 2022-02-25: 10 mL

## 2022-02-25 MED ORDER — LIDOCAINE HCL (PF) 2 % IJ SOLN
INTRAMUSCULAR | Status: AC
  Filled 2022-02-25: qty 10

## 2022-02-25 NOTE — Progress Notes (Signed)
PT tolerated thyroid biopsy procedure well today. Labs and afirma obtained and sent for pathology. PT ambulatory at discharge with no acute distress noted and verbalized understanding of discharge instructions. 

## 2022-02-26 LAB — CYTOLOGY - NON PAP

## 2022-03-04 DIAGNOSIS — E042 Nontoxic multinodular goiter: Secondary | ICD-10-CM | POA: Diagnosis not present

## 2022-03-05 ENCOUNTER — Ambulatory Visit (INDEPENDENT_AMBULATORY_CARE_PROVIDER_SITE_OTHER): Payer: BC Managed Care – PPO | Admitting: Gastroenterology

## 2022-03-05 ENCOUNTER — Encounter (INDEPENDENT_AMBULATORY_CARE_PROVIDER_SITE_OTHER): Payer: Self-pay | Admitting: Gastroenterology

## 2022-03-16 ENCOUNTER — Encounter: Payer: Self-pay | Admitting: Internal Medicine

## 2022-03-16 ENCOUNTER — Ambulatory Visit: Payer: BC Managed Care – PPO | Admitting: Internal Medicine

## 2022-03-18 ENCOUNTER — Encounter (HOSPITAL_COMMUNITY): Payer: Self-pay

## 2022-04-07 ENCOUNTER — Other Ambulatory Visit: Payer: Self-pay | Admitting: Internal Medicine

## 2022-04-07 DIAGNOSIS — I10 Essential (primary) hypertension: Secondary | ICD-10-CM

## 2022-04-28 ENCOUNTER — Other Ambulatory Visit (HOSPITAL_COMMUNITY): Payer: Self-pay | Admitting: Adult Health

## 2022-04-28 DIAGNOSIS — R928 Other abnormal and inconclusive findings on diagnostic imaging of breast: Secondary | ICD-10-CM

## 2022-04-29 ENCOUNTER — Ambulatory Visit (HOSPITAL_COMMUNITY)
Admission: RE | Admit: 2022-04-29 | Discharge: 2022-04-29 | Disposition: A | Discharge status: Home / Self Care | Payer: BC Managed Care – PPO | Source: Ambulatory Visit | Attending: Adult Health | Admitting: Adult Health

## 2022-04-29 DIAGNOSIS — R928 Other abnormal and inconclusive findings on diagnostic imaging of breast: Secondary | ICD-10-CM | POA: Diagnosis not present

## 2022-04-29 DIAGNOSIS — N6321 Unspecified lump in the left breast, upper outer quadrant: Secondary | ICD-10-CM | POA: Diagnosis not present

## 2022-05-19 ENCOUNTER — Ambulatory Visit: Payer: BC Managed Care – PPO | Admitting: Adult Health

## 2022-05-20 ENCOUNTER — Ambulatory Visit (INDEPENDENT_AMBULATORY_CARE_PROVIDER_SITE_OTHER): Payer: BC Managed Care – PPO | Admitting: Internal Medicine

## 2022-05-20 ENCOUNTER — Encounter: Payer: Self-pay | Admitting: Internal Medicine

## 2022-05-20 VITALS — BP 128/84 | HR 69 | Resp 18 | Ht 64.0 in | Wt 210.2 lb

## 2022-05-20 DIAGNOSIS — E042 Nontoxic multinodular goiter: Secondary | ICD-10-CM | POA: Diagnosis not present

## 2022-05-20 DIAGNOSIS — E559 Vitamin D deficiency, unspecified: Secondary | ICD-10-CM

## 2022-05-20 DIAGNOSIS — K219 Gastro-esophageal reflux disease without esophagitis: Secondary | ICD-10-CM

## 2022-05-20 DIAGNOSIS — Z0000 Encounter for general adult medical examination without abnormal findings: Secondary | ICD-10-CM | POA: Diagnosis not present

## 2022-05-20 DIAGNOSIS — Z23 Encounter for immunization: Secondary | ICD-10-CM | POA: Diagnosis not present

## 2022-05-20 DIAGNOSIS — I10 Essential (primary) hypertension: Secondary | ICD-10-CM | POA: Diagnosis not present

## 2022-05-20 DIAGNOSIS — Z1159 Encounter for screening for other viral diseases: Secondary | ICD-10-CM

## 2022-05-20 DIAGNOSIS — Z114 Encounter for screening for human immunodeficiency virus [HIV]: Secondary | ICD-10-CM

## 2022-05-20 NOTE — Progress Notes (Signed)
Established Patient Office Visit  Subjective:  Patient ID: Gina Coleman, female    DOB: [birth year]  Age: 46 y.o. MRN: [id number]  CC:  Chief Complaint  Patient presents with   Follow-up    4 month follow up HTN     HPI Gina Coleman is a 46 y.o. female with past medical history of hypertension and subclinical hyperthyroidism who presents for f/u of her chronic medical conditions.  HTN: BP is well-controlled. Takes medications regularly. Patient denies headache, dizziness, chest pain, dyspnea or palpitations.  Hyperthyroidism: On Methimazole.  Followed by endocrinology.   She has been taking Pepcid daily and Nexium PRN for GERD.  Her acid reflux symptoms are better now.  Denies any dysphagia or odynophagia.    Past Medical History:  Diagnosis Date   Acid reflux    Anxiety    Headache    Hypertension    Hyperthyroidism     Past Surgical History:  Procedure Laterality Date   BIOPSY  06/05/2021   Procedure: BIOPSY;  Surgeon: Rogene Houston, MD;  [facility];  Service: Endoscopy;;   COLONOSCOPY N/A 06/05/2021   rehman:The examined portion of the ileum was normal. one 44m polyp in cecum (tubular adenoma). external hemorrhoids   ESOPHAGOGASTRODUODENOSCOPY N/A 06/05/2021   Rehman:Normal hypopharynx.normal esophagus, z line irregular 36 cm from incisors, gastritics with reactive gastropathy, normal duodenal bulb and second portion of duodenum   FOOT SURGERY Right    LEG SURGERY Right    TRANSFORAMINAL LUMBAR INTERBODY FUSION W/ MIS 1 LEVEL N/A 12/08/2021   Procedure: Lumbar Three-Four Minimally Invasive Transforaminal Lumbar Interbody Fusion;  Surgeon: OJudith Part MD;  Location[place] [facility]  Service: Neurosurgery;  Laterality: N/A;  Lumbar Three-Four Minimally Invasive Transforaminal Lumbar Interbody Fusion   TUBAL LIGATION      Family History  Problem Relation Age of Onset   CAD Mother        Premature disease   Hypertension Sister    Breast  cancer Maternal Grandmother    HIV Sister    Asthma Son     Social History   Socioeconomic History   Marital status: Married    Spouse name: Not on file   Number of children: Not on file   Years of education: Not on file   Highest education level: Not on file  Occupational History   Not on file  Tobacco Use   Smoking status: Former    Types: Cigarettes   Smokeless tobacco: Never  Vaping Use   Vaping Use: Never used  Substance and Sexual Activity   Alcohol use: Yes    Alcohol/week: 3.0 standard drinks of alcohol    Types: 3 Glasses of wine per week    Comment: social    Drug use: Never   Sexual activity: Yes    Birth control/protection: Surgical    Comment: tubal  Other Topics Concern   Not on file  Social History Narrative   Not on file   Social Determinants of Health   Financial Resource Strain: Low Risk  (02/12/2021)   Overall Financial Resource Strain (CARDIA)    Difficulty of Paying Living Expenses: Not hard at all  Food Insecurity: No Food Insecurity (02/12/2021)   Hunger Vital Sign    Worried About Running Out of Food in the Last Year: Never true    Ran Out of Food in the Last Year: Never true  Transportation Needs: No Transportation Needs (02/12/2021)   PRAPARE - Transportation    Lack  of Transportation (Medical): No    Lack of Transportation (Non-Medical): No  Physical Activity: Inactive (02/12/2021)   Exercise Vital Sign    Days of Exercise per Week: 0 days    Minutes of Exercise per Session: 0 min  Stress: No Stress Concern Present (02/12/2021)   [place]    Feeling of Stress : Only a little  Social Connections: Moderately Isolated (02/12/2021)   Social Connection and Isolation Panel [NHANES]    Frequency of Communication with Friends and Family: More than three times a week    Frequency of Social Gatherings with Friends and Family: Once a week    Attends Religious Services: Never     Marine scientist or Organizations: No    Attends Archivist Meetings: Never    Marital Status: Married  Human resources officer Violence: Not At Risk (02/12/2021)   Humiliation, Afraid, Rape, and Kick questionnaire    Fear of Current or Ex-Partner: No    Emotionally Abused: No    Physically Abused: No    Sexually Abused: No    Outpatient Medications Prior to Visit  Medication Sig Dispense Refill   amLODipine (NORVASC) 5 MG tablet TAKE 1 TABLET(5 MG) BY MOUTH DAILY 90 tablet 1   esomeprazole (NEXIUM) 40 MG packet Take 40 mg by mouth 2 (two) times daily. (Patient taking differently: Take 40 mg by mouth daily as needed (heartburn).) 120 each 5   famotidine (PEPCID) 40 MG tablet Take 1 tablet (40 mg total) by mouth daily. In the evening 90 tablet 3   Flaxseed, Linseed, (FLAXSEED OIL PO) Take 1 capsule by mouth daily.     methimazole (TAPAZOLE) 5 MG tablet Take 1 tablet (5 mg total) by mouth daily. 90 tablet 2   Omega-3 Fatty Acids (FISH OIL) 1200 MG CAPS Take 1,200 mg by mouth daily.     triamcinolone cream (KENALOG) 0.1 % Apply 1 application topically daily as needed (Eczema).     vitamin B-6 (PYRIDOXINE) 25 MG tablet Take 1 tid (Patient taking differently: Take 25 mg by mouth daily.)     diazepam (VALIUM) 10 MG tablet Take 1 tablet (10 mg total) by mouth every 6 (six) hours as needed for muscle spasms. 30 tablet 0   gabapentin (NEURONTIN) 100 MG capsule Take 100 mg by mouth at bedtime.     No facility-administered medications prior to visit.    Allergies  Allergen Reactions   Latex Hives   Other Rash     GLOVE POWDER     Flexeril [Cyclobenzaprine] Palpitations    Happened with 5 mg dose    ROS Review of Systems  Constitutional:  Negative for chills and fever.  HENT:  Negative for congestion, sinus pressure, sinus pain and sore throat.   Eyes:  Negative for pain and discharge.  Respiratory:  Negative for cough and shortness of breath.   Cardiovascular:  Negative for  chest pain and palpitations.  Gastrointestinal:  Negative for diarrhea, nausea and vomiting.  Endocrine: Negative for polydipsia and polyuria.  Genitourinary:  Negative for dysuria and hematuria.  Musculoskeletal:  Positive for back pain and neck pain. Negative for neck stiffness.  Skin:  Negative for rash.  Neurological:  Negative for dizziness and weakness.  Psychiatric/Behavioral:  Negative for agitation and behavioral problems.       Objective:    Physical Exam Vitals reviewed.  Constitutional:      General: She is not in acute distress.  Appearance: She is obese. She is not diaphoretic.  HENT:     Head: Normocephalic and atraumatic.     Nose: Nose normal. No congestion.     Mouth/Throat:     Mouth: Mucous membranes are moist.     Pharynx: No posterior oropharyngeal erythema.  Eyes:     General: No scleral icterus.    Extraocular Movements: Extraocular movements intact.  Neck:     Thyroid: Thyromegaly present. No thyroid tenderness.  Cardiovascular:     Rate and Rhythm: Normal rate and regular rhythm.     Pulses: Normal pulses.     Heart sounds: Normal heart sounds. No murmur heard.    No friction rub. No gallop.  Pulmonary:     Breath sounds: Normal breath sounds. No wheezing or rales.  Musculoskeletal:     Cervical back: Normal range of motion. No rigidity.     Right lower leg: No edema.     Left lower leg: No edema.  Skin:    General: Skin is warm.     Findings: No rash.  Neurological:     General: No focal deficit present.     Mental Status: She is alert and oriented to person, place, and time.     Sensory: No sensory deficit.     Motor: No weakness.  Psychiatric:        Mood and Affect: Mood normal.        Behavior: Behavior normal.     BP 128/84 (BP Location: Right Arm, Patient Position: Sitting, Cuff Size: Normal)   Pulse 69   Resp 18   Ht '5\' 4"'$  (1.626 m)   Wt 210 lb 3.2 oz (95.3 kg)   SpO2 99%   BMI 36.08 kg/m  Wt Readings from Last 3  Encounters:  05/20/22 210 lb 3.2 oz (95.3 kg)  02/11/22 211 lb (95.7 kg)  01/22/22 215 lb 3.2 oz (97.6 kg)    Lab Results  Component Value Date   TSH 1.23 02/11/2022   Lab Results  Component Value Date   WBC 7.0 12/05/2021   HGB 12.4 12/05/2021   HCT 40.3 12/05/2021   MCV 89.4 12/05/2021   PLT 288 12/05/2021   Lab Results  Component Value Date   NA 139 12/05/2021   K 3.8 12/05/2021   CO2 27 12/05/2021   GLUCOSE 102 (H) 12/05/2021   BUN 12 12/05/2021   CREATININE 0.76 12/05/2021   BILITOT 0.4 07/16/2021   ALKPHOS 99 07/16/2021   AST 15 07/16/2021   ALT 21 07/16/2021   PROT 6.9 07/16/2021   ALBUMIN 3.4 (L) 07/16/2021   CALCIUM 8.9 12/05/2021   ANIONGAP 6 12/05/2021   GFR 69.56 05/13/2020   Lab Results  Component Value Date   CHOL 198 10/30/2019   Lab Results  Component Value Date   HDL 58 10/30/2019   Lab Results  Component Value Date   LDLCALC 126 (H) 10/30/2019   Lab Results  Component Value Date   TRIG 52 10/30/2019   Lab Results  Component Value Date   CHOLHDL 3.4 10/30/2019   No results found for: "HGBA1C"    Assessment & Plan:   Problem List Items Addressed This Visit       Cardiovascular and Mediastinum   Hypertension    Well-controlled On Amlodipine 5 mg QD Advised to follow DASH diet and perform moderate exercise as tolerated        Digestive   GERD (gastroesophageal reflux disease)    On Pepcid 40  mg daily On Nexium 40 mg PRN         Endocrine   Multinodular goiter - Primary    On Methimazole 5 mg BID Follows Endocrinology Last TSH and T4 wnl       Vitamin D deficiency       Relevant Orders   VITAMIN D 25 Hydroxy (Vit-D Deficiency, Fractures)       No orders of the defined types were placed in this encounter.   Follow-up: Return in about 6 months (around 11/19/2022) for Annual physical.    Lindell Spar, MD

## 2022-05-20 NOTE — Addendum Note (Signed)
Addended by: Zacarias Pontes R on: 05/20/2022 02:19 PM   Modules accepted: Orders

## 2022-05-20 NOTE — Assessment & Plan Note (Addendum)
On Methimazole 5 mg BID Follows Endocrinology Last TSH and T4 wnl 

## 2022-05-20 NOTE — Patient Instructions (Signed)
Please continue taking medications as prescribed.  Please continue to follow low salt diet and ambulate as tolerated.  Please get fasting blood tests done before the next visit. 

## 2022-05-20 NOTE — Assessment & Plan Note (Signed)
Well-controlled On Amlodipine 5 mg QD Advised to follow DASH diet and perform moderate exercise as tolerated 

## 2022-05-20 NOTE — Assessment & Plan Note (Addendum)
On Pepcid 40 mg daily On Nexium 40 mg PRN

## 2022-07-01 ENCOUNTER — Encounter: Payer: Self-pay | Admitting: Nurse Practitioner

## 2022-07-01 ENCOUNTER — Ambulatory Visit (INDEPENDENT_AMBULATORY_CARE_PROVIDER_SITE_OTHER): Payer: BC Managed Care – PPO | Admitting: Nurse Practitioner

## 2022-07-01 VITALS — BP 132/78 | HR 90 | Ht 64.0 in | Wt 208.1 lb

## 2022-07-01 DIAGNOSIS — I10 Essential (primary) hypertension: Secondary | ICD-10-CM | POA: Diagnosis not present

## 2022-07-01 DIAGNOSIS — K640 First degree hemorrhoids: Secondary | ICD-10-CM

## 2022-07-01 DIAGNOSIS — K649 Unspecified hemorrhoids: Secondary | ICD-10-CM | POA: Insufficient documentation

## 2022-07-01 MED ORDER — LIDOCAINE-HYDROCORT (PERIANAL) 3-0.5 % EX CREA: 1.0000 "application " | TOPICAL_CREAM | Freq: Two times a day (BID) | CUTANEOUS | 0 refills | Status: DC | PRN

## 2022-07-01 NOTE — Assessment & Plan Note (Signed)
Lidocaine-hydrocortisone perianal  3-0.5% cream ordered, apply twice daily as needed Patient encouraged not to use medication for more than 7 days. Encouraged to continue to use sitz bath's as needed

## 2022-07-01 NOTE — Assessment & Plan Note (Signed)
BP Readings from Last 3 Encounters:  07/01/22 132/78  05/20/22 128/84  02/25/22 129/70  Chronic condition well-controlled on amlodipine 5 mg daily Continue current medication DASH diet advised engage in regular moderate exercise at least 150 minutes weekly as tolerated

## 2022-07-01 NOTE — Patient Instructions (Addendum)
Please apply lidocaine hydrocortisone cream up to twice daily for hemorrhoids . Please use no more than 7 days.    It is important that you exercise regularly at least 30 minutes 5 times a week.  Think about what you will eat, plan ahead. Choose " clean, green, fresh or frozen" over canned, processed or packaged foods which are more sugary, salty and fatty. 70 to 75% of food eaten should be vegetables and fruit. Three meals at set times with snacks allowed between meals, but they must be fruit or vegetables. Aim to eat over a 12 hour period , example 7 am to 7 pm, and STOP after  your last meal of the day. Drink water,generally about 64 ounces per day, no other drink is as healthy. Fruit juice is best enjoyed in a healthy way, by EATING the fruit.  Thanks for choosing [facility], we consider it a privelige to serve you.

## 2022-07-01 NOTE — Progress Notes (Signed)
    Gina Coleman     MRN: [id number]      DOB: [birth year]   HPI Gina Coleman with past medical history hypertension, GERD, peripheral neuropathy is here for f complaints of hemorrhoids .   Has hemorrhoids that comes and goes since that time she had colonoscopy. Recent  flare up of hemorrhoids has started 2 weeks ago, has itchy sensation, she denies bloody stool, constipation, rectal pain.  she has been using witch hazel preparation and sits baths as needed, parents burning sensation after bowel movements    ROS Denies recent fever or chills. Denies sinus pressure, nasal congestion, ear pain or sore throat. Denies chest congestion, productive cough or wheezing. Denies chest pains, palpitations and leg swelling Denies abdominal pain, nausea, vomiting,diarrhea or constipation.   Denies dysuria, frequency, hesitancy or incontinence. Denies joint pain, swelling and limitation in mobility. Denies headaches, seizures, numbness, or tingling. Denies depression, anxiety or insomnia.   PE  BP 132/78   Pulse 90   Ht '5\' 4"'$  (1.626 m)   Wt 208 lb 1.9 oz (94.4 kg)   SpO2 98%   BMI 35.72 kg/m   [place] CMA present as chaperone Patient alert and oriented and in no cardiopulmonary distress.  Chest: Clear to auscultation bilaterally.  CVS: S1, S2 no murmurs, no S3.Regular rate.  ABD: Soft non tender.   Ext: No edema  MS: Adequate ROM spine, shoulders, hips and knees.  Skin: Intact, no ulcerations or rash noted.  GU. No external hemorrhoids noted on examination, skin around the anus clean and dry no masses or drainage noted  Psych: Good eye contact, normal affect. Memory intact not anxious or depressed appearing.  CNS: CN 2-12 intact, power,  normal throughout.no focal deficits noted.   Assessment & Plan  Hemorrhoids Lidocaine-hydrocortisone perianal  3-0.5% cream ordered, apply twice daily as needed Patient encouraged not to use medication for more than 7 days. Encouraged to  continue to use sitz bath's as needed  Hypertension BP Readings from Last 3 Encounters:  07/01/22 132/78  05/20/22 128/84  02/25/22 129/70  Chronic condition well-controlled on amlodipine 5 mg daily Continue current medication DASH diet advised engage in regular moderate exercise at least 150 minutes weekly as tolerated

## 2022-07-03 IMAGING — RF DG LUMBAR SPINE 2-3V
1 series · 2 of 2 positions shown · non-contrast
Comparison: 10/13/2021

CLINICAL DATA: L3-4 fixation

EXAM:
LUMBAR SPINE - 2-3 VIEW

[Series 1: run · 2 of 2 slices shown]
[im 1/2]
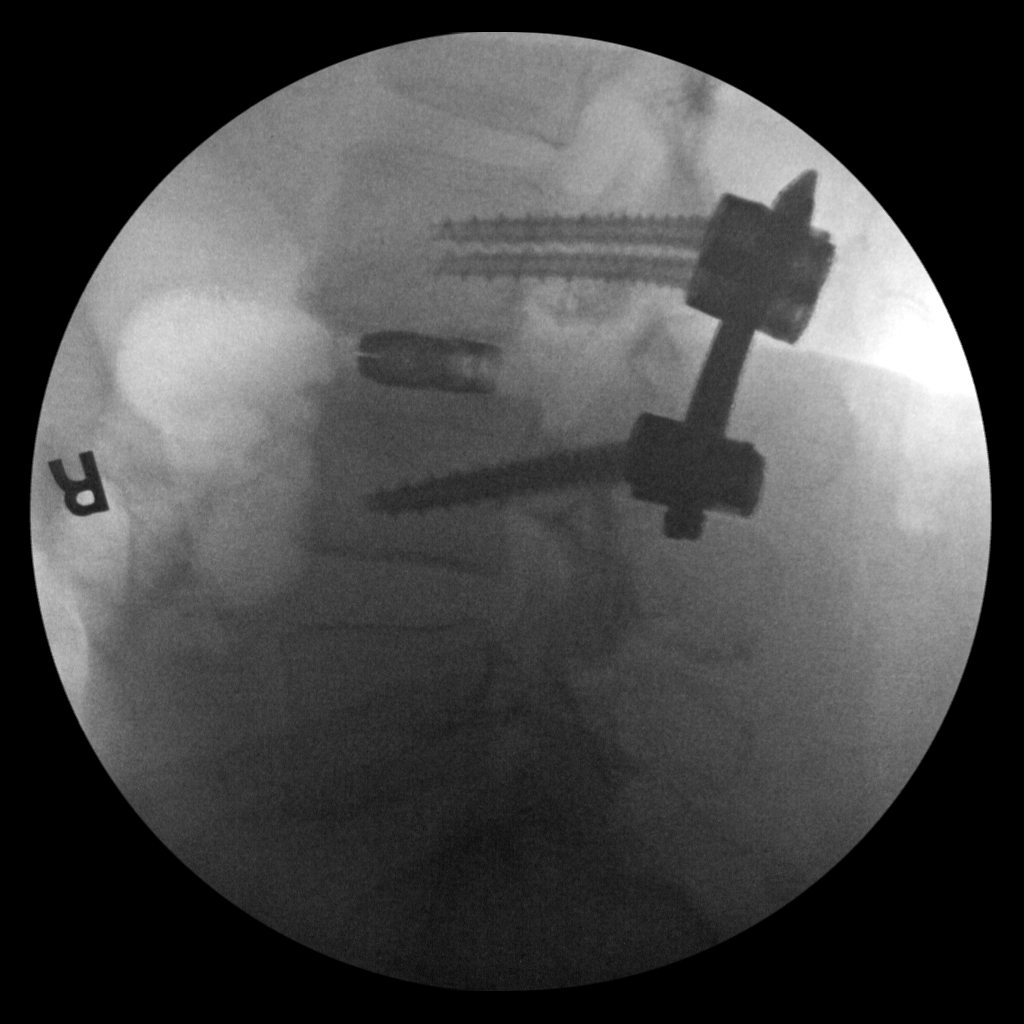
[im 2/2]
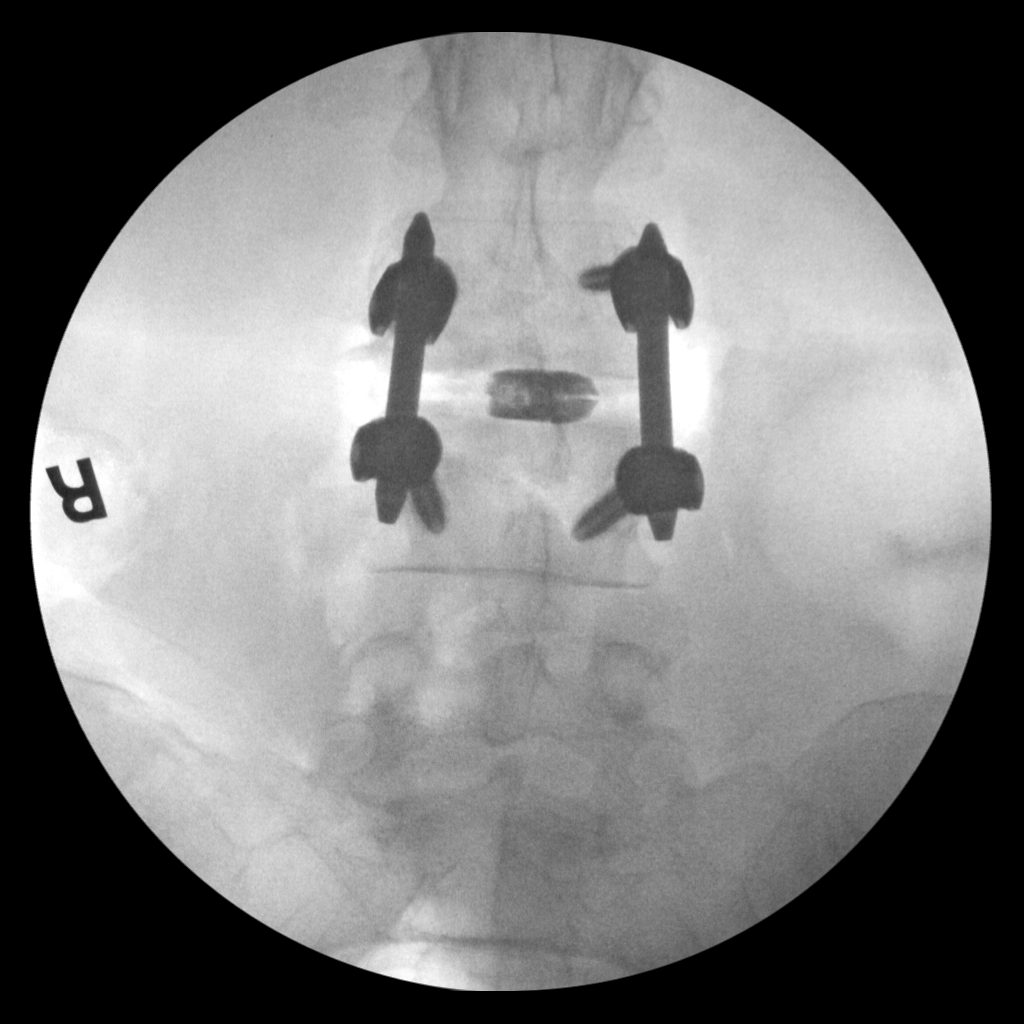

[2 of 2 positions shown; findings below may reference images not displayed]

FINDINGS: AP and lateral intraoperative views. These demonstrate trans pedicle
screw fixation with interbody fusion material at L3-4. improvement
in L3-4 anterolisthesis.
IMPRESSION: Intraoperative imaging.

## 2022-07-15 ENCOUNTER — Other Ambulatory Visit (HOSPITAL_COMMUNITY): Payer: Self-pay | Admitting: Adult Health

## 2022-07-15 DIAGNOSIS — Z1231 Encounter for screening mammogram for malignant neoplasm of breast: Secondary | ICD-10-CM

## 2022-07-16 ENCOUNTER — Ambulatory Visit (HOSPITAL_COMMUNITY)
Admission: RE | Admit: 2022-07-16 | Discharge: 2022-07-16 | Disposition: A | Discharge status: Home / Self Care | Payer: BC Managed Care – PPO | Source: Ambulatory Visit | Attending: Adult Health | Admitting: Adult Health

## 2022-07-16 DIAGNOSIS — Z1231 Encounter for screening mammogram for malignant neoplasm of breast: Secondary | ICD-10-CM | POA: Insufficient documentation

## 2022-08-09 NOTE — Progress Notes (Deleted)
Name: Gina Coleman  MRN/ DOB: [id number], [birth year]    Age/ Sex: 46 y.o., female     PCP: Lindell Spar, MD   Reason for Endocrinology Evaluation: [place]     Initial Endocrinology Clinic Visit: 07/12/2019    PATIENT IDENTIFIER: Gina Coleman is a 46 y.o., female with a past medical history of HTN. She has followed with [facility] since 07/12/2019 for consultative assistance with management of her 07/12/2019.   HISTORICAL SUMMARY: The patient was first  noted to have a low TSH at 0.399 uIU/mL during an evaluation for an enlarged cervical lymph node in 05/2019. She was started on Abx at the time.  Repeat TFT's were normal in 07/2019 . Ultrasound showed MNG . She is S/P benign FNA of the RUP nodule 08/2019   TRAb was undetectable  Methimazole started 03/2020 due to symptoms of hyperthyroidism    No FH of thyroid disorder  SUBJECTIVE:    Today (08/09/2022):  Gina Coleman is here for a follow up on subclinical hyperthyroidism. S/P FNA of the RUP nodule with benign cytology in 08/2019  Repeat ultrasound last week showed interval growth meeting FNA of the isthmic nodule #1  She was having excessive " frog feeling " which triggered the ultrasound  Denies dysphagia but has occasional intermittent pain   Denies diarrhea or constipation  She has GERD which has improved  Denies palpitations    She is on Methimazole 5 mg, once daily     HISTORY:  Past Medical History:  Past Medical History:  Diagnosis Date   Acid reflux    Anxiety    Headache    Hypertension    Hyperthyroidism    Past Surgical History:  Past Surgical History:  Procedure Laterality Date   BIOPSY  06/05/2021   Procedure: BIOPSY;  Surgeon: Rogene Houston, MD;  [facility];  Service: Endoscopy;;   COLONOSCOPY N/A 06/05/2021   rehman:The examined portion of the ileum was normal. one 42m polyp in cecum (tubular adenoma). external hemorrhoids   ESOPHAGOGASTRODUODENOSCOPY N/A  06/05/2021   Rehman:Normal hypopharynx.normal esophagus, z line irregular 36 cm from incisors, gastritics with reactive gastropathy, normal duodenal bulb and second portion of duodenum   FOOT SURGERY Right    LEG SURGERY Right    TRANSFORAMINAL LUMBAR INTERBODY FUSION W/ MIS 1 LEVEL N/A 12/08/2021   Procedure: Lumbar Three-Four Minimally Invasive Transforaminal Lumbar Interbody Fusion;  Surgeon: OJudith Part MD;  [facility]  Service: Neurosurgery;  Laterality: N/A;  Lumbar Three-Four Minimally Invasive Transforaminal Lumbar Interbody Fusion   TUBAL LIGATION     Social History:  reports that she has quit smoking. Her smoking use included cigarettes. She has never used smokeless tobacco. She reports current alcohol use of about 3.0 standard drinks of alcohol per week. She reports that she does not use drugs. Family History:  Family History  Problem Relation Age of Onset   CAD Mother        Premature disease   Hypertension Sister    Breast cancer Maternal Grandmother    HIV Sister    Asthma Son      HOME MEDICATIONS: Allergies as of 08/10/2022       Reactions   Latex Hives   Other Rash   GLOVE POWDER    Flexeril [cyclobenzaprine] Palpitations   Happened with 5 mg dose        Medication List        Accurate as of August 09, 2022  7:36 PM. If you have any questions, ask your nurse or doctor.          amLODipine 5 MG tablet Commonly known as: NORVASC TAKE 1 TABLET(5 MG) BY MOUTH DAILY   esomeprazole 40 MG packet Commonly known as: NexIUM Take 40 mg by mouth 2 (two) times daily. What changed:  when to take this reasons to take this   famotidine 40 MG tablet Commonly known as: PEPCID Take 1 tablet (40 mg total) by mouth daily. In the evening   Fish Oil 1200 MG Caps Take 1,200 mg by mouth daily.   FLAXSEED OIL PO Take 1 capsule by mouth daily.   Lidocaine-Hydrocort (Perianal) 3-0.5 % Crea Apply 1 application  topically 2 (two) times daily as  needed.   methimazole 5 MG tablet Commonly known as: TAPAZOLE Take 1 tablet (5 mg total) by mouth daily.   pyridOXINE 25 MG tablet Commonly known as: VITAMIN B6 Take 1 tid   triamcinolone cream 0.1 % Commonly known as: KENALOG Apply 1 application topically daily as needed (Eczema).          OBJECTIVE:   PHYSICAL EXAM: VS: There were no vitals taken for this visit.   EXAM: General: Pt appears well and is in NAD  Neck: General: Supple without adenopathy. Thyroid: Right thyroid  nodule appreciated.   Lungs: Clear with good BS bilat with no rales, rhonchi, or wheezes  Heart: Auscultation: RRR.  Abdomen: Normoactive bowel sounds, soft, nontender, without masses or organomegaly palpable  Extremities:  BL LE: No pretibial edema normal ROM and strength.  Mental Status: Judgment, insight: Intact Orientation: Oriented to time, place, and person Mood and affect: No depression, anxiety, or agitation     DATA REVIEWED:   Latest Reference Range & Units 02/11/22 10:50  TSH 0.35 - 5.50 uIU/mL 1.23  T4,Free(Direct) 0.60 - 1.60 ng/dL 0.79     Results for KAROLE, OO (MRN [id number]) as of 07/18/2021 13:46  Ref. Range 07/16/2021 03:33  Sodium Latest Ref Range: 135 - 145 mmol/L 140  Potassium Latest Ref Range: 3.5 - 5.1 mmol/L 3.7  Chloride Latest Ref Range: 98 - 111 mmol/L 104  CO2 Latest Ref Range: 22 - 32 mmol/L 28  Glucose Latest Ref Range: 70 - 99 mg/dL 112 (H)  BUN Latest Ref Range: 6 - 20 mg/dL 13  Creatinine Latest Ref Range: 0.44 - 1.00 mg/dL 0.66  Calcium Latest Ref Range: 8.9 - 10.3 mg/dL 8.1 (L)  Anion gap Latest Ref Range: 5 - 15  8  Alkaline Phosphatase Latest Ref Range: 38 - 126 U/L 99  Albumin Latest Ref Range: 3.5 - 5.0 g/dL 3.4 (L)  AST Latest Ref Range: 15 - 41 U/L 15  ALT Latest Ref Range: 0 - 44 U/L 21  Total Protein Latest Ref Range: 6.5 - 8.1 g/dL 6.9  Total Bilirubin Latest Ref Range: 0.3 - 1.2 mg/dL 0.4  GFR, Estimated Latest Ref Range: >60  mL/min >60  Troponin I (High Sensitivity) Latest Ref Range: <18 ng/L <2  WBC Latest Ref Range: 4.0 - 10.5 K/uL 6.4  RBC Latest Ref Range: 3.87 - 5.11 MIL/uL 4.07  Hemoglobin Latest Ref Range: 12.0 - 15.0 g/dL 11.7 (L)  HCT Latest Ref Range: 36.0 - 46.0 % 36.7  MCV Latest Ref Range: 80.0 - 100.0 fL 90.2  MCH Latest Ref Range: 26.0 - 34.0 pg 28.7  MCHC Latest Ref Range: 30.0 - 36.0 g/dL 31.9  RDW Latest Ref Range: 11.5 - 15.5 % 13.2  Platelets  Latest Ref Range: 150 - 400 K/uL 230  nRBC Latest Ref Range: 0.0 - 0.2 % 0.0  Neutrophils Latest Units: % 53  Lymphocytes Latest Units: % 32  Monocytes Relative Latest Units: % 10  Eosinophil Latest Units: % 5  Basophil Latest Units: % 0  Immature Granulocytes Latest Units: % 0  NEUT# Latest Ref Range: 1.7 - 7.7 K/uL 3.4  Lymphocyte # Latest Ref Range: 0.7 - 4.0 K/uL 2.0  Monocyte # Latest Ref Range: 0.1 - 1.0 K/uL 0.6  Eosinophils Absolute Latest Ref Range: 0.0 - 0.5 K/uL 0.3  Basophils Absolute Latest Ref Range: 0.0 - 0.1 K/uL 0.0  Abs Immature Granulocytes Latest Ref Range: 0.00 - 0.07 K/uL 0.01      Thyroid Ultrasound 02/05/2022   Estimated total number of nodules >/= 1 cm: 2   Number of spongiform nodules >/=  2 cm not described below (TR1): 0   Number of mixed cystic and solid nodules >/= 1.5 cm not described below (TR2): 0   _________________________________________________________   Nodule labeled 1 in the thyroid isthmus is a solid isoechoic TR 3 nodule that measures 2.6 x 2.2 x 1.4 cm, previously measuring up to a maximum dimension of 1.9 cm when measured in a similar fashion. It demonstrates interval growth since January 2022, and biopsy is recommended.   Nodule labeled 2 is a solid hypoechoic nodule in the right thyroid lobe measuring 3.9 x 3.1 x 1.5 cm, which remains similar in size and morphology previously measuring 3.9 cm in January 2022. This nodule was previously biopsied.   IMPRESSION: 1. Multinodular thyroid  gland. 2. The TR 3 nodule in the thyroid isthmus demonstrates interval growth measuring 2.6 cm on today's exam, previously 1.9 cm in January 2022. Biopsy is recommended. 3. Stable appearance of nodule labeled 2 in the right thyroid lobe, which was previously biopsied. Correlate with biopsy results.   FNA RUP (08/02/2019)   THYROID, FINE NEEDLE ASPIRATION, RUP (SPECIMEN 1 OF 1, COLLECTED 08/02/19): CONSISTENT WITH BENIGN FOLLICULAR NODULE (BETHESDA CATEGORY II).   FNA isthmic nodule 02/25/2022 FINAL MICROSCOPIC DIAGNOSIS:  - Atypia of undetermined significance (Bethesda category III)   Afirma Benign   ASSESSMENT / PLAN / RECOMMENDATIONS:   Multinodular Goiter :   - S/P Benign FNA of the Right upper thyroid nodule 08/2019 - She is S/P FNA of the isthmic nodule 01/2022 with atypia of undetermined significance but afirma was benign    2. Hyperthyroidism :   -Pt is clinically and biochemically euthyroid  - She was treated because she was symptomatic  - Most likely secondary to autonomous nodule  -  TFT's normal, No changes today    Medication: Methimazole 5 mg 1 tablet daily      F/U in 6 months     Signed electronically by: Mack Guise, MD  [facility]  [facility] [address] [place], [place] [address] Phone: [phone] FAX: [phone]      CC: Lindell Spar, [place] [address] Phone: [phone]  Fax: [phone]   Return to Endocrinology clinic as below: Future Appointments  Date Time Provider [place]  08/10/2022 11:10 AM Shantara Goosby, Melanie Crazier, MD [facility] None  08/11/2022 11:10 AM Estill Dooms, NP [facility]  09/15/2022  8:30 AM Estill Dooms, NP [facility]  11/19/2022  1:00 PM Lindell Spar, MD [facility]  02/18/2023  1:00 PM [facility] NURSE [facility]

## 2022-08-10 ENCOUNTER — Ambulatory Visit: Payer: BC Managed Care – PPO | Admitting: Internal Medicine

## 2022-08-11 ENCOUNTER — Encounter: Payer: Self-pay | Admitting: Adult Health

## 2022-08-11 ENCOUNTER — Ambulatory Visit (INDEPENDENT_AMBULATORY_CARE_PROVIDER_SITE_OTHER): Payer: BC Managed Care – PPO | Admitting: Adult Health

## 2022-08-11 VITALS — BP 122/80 | HR 71 | Ht 64.0 in | Wt 213.5 lb

## 2022-08-11 DIAGNOSIS — R6882 Decreased libido: Secondary | ICD-10-CM

## 2022-08-11 DIAGNOSIS — N921 Excessive and frequent menstruation with irregular cycle: Secondary | ICD-10-CM | POA: Diagnosis not present

## 2022-08-11 DIAGNOSIS — N946 Dysmenorrhea, unspecified: Secondary | ICD-10-CM

## 2022-08-11 DIAGNOSIS — N951 Menopausal and female climacteric states: Secondary | ICD-10-CM | POA: Insufficient documentation

## 2022-08-11 DIAGNOSIS — D252 Subserosal leiomyoma of uterus: Secondary | ICD-10-CM

## 2022-08-11 MED ORDER — IBUPROFEN 800 MG PO TABS: 800.0000 mg | ORAL_TABLET | Freq: Three times a day (TID) | ORAL | 1 refills | Status: DC | PRN

## 2022-08-11 NOTE — Progress Notes (Signed)
  Subjective:     Patient ID: Gina Coleman, female   DOB: 12-Apr-1976, 46 y.o.   MRN: [id number]  HPI Chole is a 46 year old black female,married, [phone] in complaining of period in August being long, and cramping, used ibuprofen and heating pad. The color was dark brown. And has had more headaches and decreased libido.  Last pap was 06/14/20 negative for malignancy and HPV   PCP is Dr Posey Pronto.  Review of Systems Long period in August +cramping pain with period Increase in headaches Decreased libido Reviewed past medical,surgical, social and family history. Reviewed medications and allergies.     Objective:   Physical Exam BP 122/80 (BP Location: Left Arm, Patient Position: Sitting, Cuff Size: Normal)   Pulse 71   Ht '5\' 4"'$  (1.626 m)   Wt 213 lb 8 oz (96.8 kg)   LMP 07/20/2022   BMI 36.65 kg/m     Skin warm and dry.Pelvic: external genitalia is normal in appearance no lesions, vagina:pink, urethra has no lesions or masses noted, cervix:smooth and bulbous, uterus: normal size, shape and contour, mildly tender, no masses felt, adnexa: no masses or tenderness noted. Bladder is non tender and no masses felt.    Upstream - 08/11/22 1117       Pregnancy Intention Screening   Does the patient want to become pregnant in the next year? No    Does the patient's partner want to become pregnant in the next year? No    Would the patient like to discuss contraceptive options today? No      Contraception Wrap Up   Current Method Female Sterilization    End Method Female Sterilization            Examination chaperoned by Levy Pupa LPN  Assessment:     1. Dysmenorrhea - US PELVIC COMPLETE WITH TRANSVAGINAL; Future  2. Prolonged menstruation Will get pelvic US to assess uterus and ovaries, 08/14/22 at 1;30 pm at [place]  - US PELVIC COMPLETE WITH TRANSVAGINAL; Future  3. Decreased libido  4. Perimenopause Discussed symptoms with her Review handout on perimenopause and  menopause   5. Fibroids, subserous Has history of 1 cm subserosal fibroid  - US PELVIC COMPLETE WITH TRANSVAGINAL; Future     Plan:       Has physical 09/15/22 at 8:30 am

## 2022-08-14 ENCOUNTER — Ambulatory Visit (HOSPITAL_COMMUNITY)
Admission: RE | Admit: 2022-08-14 | Discharge: 2022-08-14 | Disposition: A | Discharge status: Home / Self Care | Payer: BC Managed Care – PPO | Source: Ambulatory Visit | Attending: Adult Health | Admitting: Adult Health

## 2022-08-14 DIAGNOSIS — N946 Dysmenorrhea, unspecified: Secondary | ICD-10-CM | POA: Insufficient documentation

## 2022-08-14 DIAGNOSIS — D252 Subserosal leiomyoma of uterus: Secondary | ICD-10-CM | POA: Diagnosis not present

## 2022-08-14 DIAGNOSIS — N83202 Unspecified ovarian cyst, left side: Secondary | ICD-10-CM | POA: Diagnosis not present

## 2022-08-14 DIAGNOSIS — N921 Excessive and frequent menstruation with irregular cycle: Secondary | ICD-10-CM | POA: Diagnosis not present

## 2022-08-14 DIAGNOSIS — D259 Leiomyoma of uterus, unspecified: Secondary | ICD-10-CM | POA: Diagnosis not present

## 2022-09-15 ENCOUNTER — Encounter: Payer: Self-pay | Admitting: Adult Health

## 2022-09-15 ENCOUNTER — Ambulatory Visit (INDEPENDENT_AMBULATORY_CARE_PROVIDER_SITE_OTHER): Payer: BC Managed Care – PPO | Admitting: Adult Health

## 2022-09-15 VITALS — BP 134/80 | HR 87 | Ht 64.0 in | Wt 213.0 lb

## 2022-09-15 DIAGNOSIS — Z1211 Encounter for screening for malignant neoplasm of colon: Secondary | ICD-10-CM | POA: Diagnosis not present

## 2022-09-15 DIAGNOSIS — Z01419 Encounter for gynecological examination (general) (routine) without abnormal findings: Secondary | ICD-10-CM | POA: Diagnosis not present

## 2022-09-15 DIAGNOSIS — N951 Menopausal and female climacteric states: Secondary | ICD-10-CM

## 2022-09-15 DIAGNOSIS — K640 First degree hemorrhoids: Secondary | ICD-10-CM | POA: Diagnosis not present

## 2022-09-15 DIAGNOSIS — N946 Dysmenorrhea, unspecified: Secondary | ICD-10-CM

## 2022-09-15 LAB — HEMOCCULT GUIAC POC 1CARD (OFFICE): Fecal Occult Blood, POC: NEGATIVE

## 2022-09-15 NOTE — Progress Notes (Signed)
Patient ID: Gina Coleman, female   DOB: 1976-11-17, 46 y.o.   MRN: [id number] History of Present Illness: Gina Coleman is a 46 year old black female, married, [id number] in for a well woman gyn exam. She has pain that radiates from rectum to vagina usually 2-3 days after having had sex.it usually lasts maybe 1-2 hours and goes away. Periods painful at times and motrin helps.Periods regular, night sweats at times.  Last pap was negative HPV and malignancy 06/14/20.  PCP is Dr Posey Pronto.    Current Medications, Allergies, Past Medical History, Past Surgical History, Family History and Social History were reviewed in [employer] record.     Review of Systems: Patient denies any headaches, hearing loss, fatigue, blurred vision, shortness of breath, chest pain, abdominal pain, problems with bowel movements, urination, or intercourse. No joint pain or mood swings.  She HPI for positives.   Physical Exam:BP 134/80 (BP Location: Left Arm, Patient Position: Sitting, Cuff Size: Normal)   Pulse 87   Ht '5\' 4"'$  (1.626 m)   Wt 213 lb (96.6 kg)   LMP 08/27/2022   BMI 36.56 kg/m   General:  Well developed, well nourished, no acute distress Skin:  Warm and dry Neck:  Midline trachea, enlarged thyroid,has known goiter, good ROM, no lymphadenopathy Lungs; Clear to auscultation bilaterally Breast:  No dominant palpable mass, retraction, or nipple discharge Cardiovascular: Regular rate and rhythm Abdomen:  Soft, non tender, no hepatosplenomegaly Pelvic:  External genitalia is normal in appearance, no lesions.  The vagina is normal in appearance. Urethra has no lesions or masses. The cervix is bulbous.  Uterus is felt to be normal size, shape, and contour.  No adnexal masses or tenderness noted.Bladder is non tender, no masses felt. Rectal: Good sphincter tone, no polyps,+ hemorrhoids felt.  Hemoccult negative. Extremities/musculoskeletal:  No swelling or varicosities noted, no clubbing or  cyanosis Psych:  No mood changes, alert and cooperative,seems happy AA is 5 Fall risk is low    09/15/2022    8:46 AM 07/01/2022    1:56 PM 05/20/2022    1:04 PM  Depression screen PHQ 2/9  Decreased Interest 3 0 0  Down, Depressed, Hopeless 0 0 0  PHQ - 2 Score 3 0 0  Altered sleeping 0    Tired, decreased energy 0    Change in appetite 0    Feeling bad or failure about yourself  0    Trouble concentrating 0    Moving slowly or fidgety/restless 0    Suicidal thoughts 0    PHQ-9 Score 3         09/15/2022    8:50 AM 09/13/2020   12:31 PM  GAD 7 : Generalized Anxiety Score  Nervous, Anxious, on Edge 0 0  Control/stop worrying 0 0  Worry too much - different things 0 1  Trouble relaxing 0 0  Restless 0 0  Easily annoyed or irritable 2 1  Afraid - awful might happen 0 0  Total GAD 7 Score 2 2      Upstream - 09/15/22 0844       Pregnancy Intention Screening   Does the patient want to become pregnant in the next year? No    Does the patient's partner want to become pregnant in the next year? No    Would the patient like to discuss contraceptive options today? No      Contraception Wrap Up   Current Method Female Sterilization    End Method Female  Sterilization            Examination chaperoned by Levy Pupa LPN  Impression and Plan: 1. Encounter for well woman exam with routine gynecological exam Pap and physical in 1 year Mammogram was negative 07/16/22 Colonoscopy per GI  Labs with PCP   2. Encounter for screening fecal occult blood testing Hemoccult was negative   3. Grade I hemorrhoids Use warm bath, tucks and try Anusol or preparation H supp OTC  4. Perimenopause Has night sweats at times Periods regular   5. Dysmenorrhea Has pain with periods at times, and motrin helps She had pelvic US 08/14/22 had small fibroid and left ovarian cyst

## 2022-09-28 DIAGNOSIS — Z1159 Encounter for screening for other viral diseases: Secondary | ICD-10-CM | POA: Diagnosis not present

## 2022-09-28 DIAGNOSIS — Z114 Encounter for screening for human immunodeficiency virus [HIV]: Secondary | ICD-10-CM | POA: Diagnosis not present

## 2022-09-28 DIAGNOSIS — E559 Vitamin D deficiency, unspecified: Secondary | ICD-10-CM | POA: Diagnosis not present

## 2022-09-28 DIAGNOSIS — R7301 Impaired fasting glucose: Secondary | ICD-10-CM | POA: Diagnosis not present

## 2022-09-29 ENCOUNTER — Other Ambulatory Visit: Payer: Self-pay | Admitting: *Deleted

## 2022-09-29 DIAGNOSIS — K219 Gastro-esophageal reflux disease without esophagitis: Secondary | ICD-10-CM

## 2022-09-29 LAB — CMP14+EGFR
ALT: 11 IU/L (ref 0–32)
AST: 12 IU/L (ref 0–40)
Albumin/Globulin Ratio: 1.3 (ref 1.2–2.2)
Albumin: 4.1 g/dL (ref 3.9–4.9)
Alkaline Phosphatase: 124 IU/L — ABNORMAL HIGH (ref 44–121)
BUN/Creatinine Ratio: 12 (ref 9–23)
BUN: 10 mg/dL (ref 6–24)
Bilirubin Total: 0.5 mg/dL (ref 0.0–1.2)
CO2: 23 mmol/L (ref 20–29)
Calcium: 9 mg/dL (ref 8.7–10.2)
Chloride: 105 mmol/L (ref 96–106)
Creatinine, Ser: 0.81 mg/dL (ref 0.57–1.00)
Globulin, Total: 3.1 g/dL (ref 1.5–4.5)
Glucose: 96 mg/dL (ref 70–99)
Potassium: 4.3 mmol/L (ref 3.5–5.2)
Sodium: 142 mmol/L (ref 134–144)
Total Protein: 7.2 g/dL (ref 6.0–8.5)
eGFR: 90 mL/min/{1.73_m2} (ref >59)

## 2022-09-29 LAB — CBC WITH DIFFERENTIAL/PLATELET
Basophils Absolute: 0 10*3/uL (ref 0.0–0.2)
Basos: 1 %
EOS (ABSOLUTE): 0.3 10*3/uL (ref 0.0–0.4)
Eos: 6 %
Hematocrit: 38.9 % (ref 34.0–46.6)
Hemoglobin: 12.4 g/dL (ref 11.1–15.9)
Immature Grans (Abs): 0 10*3/uL (ref 0.0–0.1)
Immature Granulocytes: 0 %
Lymphocytes Absolute: 1.9 10*3/uL (ref 0.7–3.1)
Lymphs: 38 %
MCH: 28.2 pg (ref 26.6–33.0)
MCHC: 31.9 g/dL (ref 31.5–35.7)
MCV: 88 fL (ref 79–97)
Monocytes Absolute: 0.5 10*3/uL (ref 0.1–0.9)
Monocytes: 11 %
Neutrophils Absolute: 2.3 10*3/uL (ref 1.4–7.0)
Neutrophils: 44 %
Platelets: 251 10*3/uL (ref 150–450)
RBC: 4.4 x10E6/uL (ref 3.77–5.28)
RDW: 12.5 % (ref 11.7–15.4)
WBC: 5.2 10*3/uL (ref 3.4–10.8)

## 2022-09-29 LAB — HEMOGLOBIN A1C
Est. average glucose Bld gHb Est-mCnc: 120 mg/dL
Hgb A1c MFr Bld: 5.8 % — ABNORMAL HIGH (ref 4.8–5.6)

## 2022-09-29 LAB — LIPID PANEL
Chol/HDL Ratio: 3.1 ratio (ref 0.0–4.4)
Cholesterol, Total: 162 mg/dL (ref 100–199)
HDL: 53 mg/dL (ref >39)
LDL Chol Calc (NIH): 98 mg/dL (ref 0–99)
Triglycerides: 55 mg/dL (ref 0–149)
VLDL Cholesterol Cal: 11 mg/dL (ref 5–40)

## 2022-09-29 LAB — VITAMIN D 25 HYDROXY (VIT D DEFICIENCY, FRACTURES): Vit D, 25-Hydroxy: 29.2 ng/mL — ABNORMAL LOW (ref 30.0–100.0)

## 2022-09-29 LAB — HEPATITIS C ANTIBODY: Hep C Virus Ab: NONREACTIVE

## 2022-09-29 LAB — HIV ANTIBODY (ROUTINE TESTING W REFLEX): HIV Screen 4th Generation wRfx: NONREACTIVE

## 2022-09-29 MED ORDER — FAMOTIDINE 40 MG PO TABS: 40.0000 mg | ORAL_TABLET | Freq: Every day | ORAL | 3 refills | Status: DC

## 2022-10-09 ENCOUNTER — Other Ambulatory Visit: Payer: Self-pay | Admitting: Internal Medicine

## 2022-10-09 DIAGNOSIS — I10 Essential (primary) hypertension: Secondary | ICD-10-CM

## 2022-10-26 ENCOUNTER — Emergency Department (HOSPITAL_COMMUNITY)
Admission: EM | Admit: 2022-10-26 | Discharge: 2022-10-26 | Discharge status: Against Medical Advice | Payer: BC Managed Care – PPO | Attending: Emergency Medicine | Admitting: Emergency Medicine

## 2022-10-26 ENCOUNTER — Emergency Department (HOSPITAL_COMMUNITY): Payer: BC Managed Care – PPO

## 2022-10-26 ENCOUNTER — Other Ambulatory Visit: Payer: Self-pay

## 2022-10-26 DIAGNOSIS — R0789 Other chest pain: Secondary | ICD-10-CM | POA: Insufficient documentation

## 2022-10-26 DIAGNOSIS — Z5329 Procedure and treatment not carried out because of patient's decision for other reasons: Secondary | ICD-10-CM | POA: Insufficient documentation

## 2022-10-26 DIAGNOSIS — Z79899 Other long term (current) drug therapy: Secondary | ICD-10-CM | POA: Insufficient documentation

## 2022-10-26 DIAGNOSIS — R079 Chest pain, unspecified: Secondary | ICD-10-CM | POA: Diagnosis not present

## 2022-10-26 DIAGNOSIS — F17210 Nicotine dependence, cigarettes, uncomplicated: Secondary | ICD-10-CM | POA: Diagnosis not present

## 2022-10-26 DIAGNOSIS — Z91040 Latex allergy status: Secondary | ICD-10-CM | POA: Insufficient documentation

## 2022-10-26 DIAGNOSIS — I10 Essential (primary) hypertension: Secondary | ICD-10-CM | POA: Insufficient documentation

## 2022-10-26 DIAGNOSIS — R072 Precordial pain: Secondary | ICD-10-CM | POA: Diagnosis not present

## 2022-10-26 LAB — TROPONIN I (HIGH SENSITIVITY)
Troponin I (High Sensitivity): <2 ng/L (ref <18)
Troponin I (High Sensitivity): <2 ng/L (ref <18)

## 2022-10-26 LAB — BASIC METABOLIC PANEL
Anion gap: 7 (ref 5–15)
BUN: 11 mg/dL (ref 6–20)
CO2: 26 mmol/L (ref 22–32)
Calcium: 8.8 mg/dL — ABNORMAL LOW (ref 8.9–10.3)
Chloride: 105 mmol/L (ref 98–111)
Creatinine, Ser: 0.67 mg/dL (ref 0.44–1.00)
GFR, Estimated: >60 mL/min (ref >60)
Glucose, Bld: 102 mg/dL — ABNORMAL HIGH (ref 70–99)
Potassium: 4.1 mmol/L (ref 3.5–5.1)
Sodium: 138 mmol/L (ref 135–145)

## 2022-10-26 LAB — CBC
HCT: 38.9 % (ref 36.0–46.0)
Hemoglobin: 12.4 g/dL (ref 12.0–15.0)
MCH: 28.3 pg (ref 26.0–34.0)
MCHC: 31.9 g/dL (ref 30.0–36.0)
MCV: 88.8 fL (ref 80.0–100.0)
Platelets: 240 10*3/uL (ref 150–400)
RBC: 4.38 MIL/uL (ref 3.87–5.11)
RDW: 13.2 % (ref 11.5–15.5)
WBC: 5 10*3/uL (ref 4.0–10.5)
nRBC: 0 % (ref 0.0–0.2)

## 2022-10-26 MED ORDER — SODIUM CHLORIDE 0.9 % IV BOLUS
1000.0000 mL | Freq: Once | INTRAVENOUS | Status: AC
Start: 2022-10-26 — End: 2022-10-26
  Administered 2022-10-26: 1000 mL via INTRAVENOUS

## 2022-10-26 MED ORDER — ALUM & MAG HYDROXIDE-SIMETH 200-200-20 MG/5ML PO SUSP
30.0000 mL | Freq: Once | ORAL | Status: AC
  Administered 2022-10-26: 30 mL via ORAL
  Filled 2022-10-26: qty 30

## 2022-10-26 MED ORDER — LIDOCAINE VISCOUS HCL 2 % MT SOLN
15.0000 mL | Freq: Once | OROMUCOSAL | Status: AC
  Administered 2022-10-26: 15 mL via ORAL
  Filled 2022-10-26: qty 15

## 2022-10-26 MED ORDER — PANTOPRAZOLE SODIUM 40 MG IV SOLR
40.0000 mg | Freq: Once | INTRAVENOUS | Status: AC
  Administered 2022-10-26: 40 mg via INTRAVENOUS
  Filled 2022-10-26: qty 10

## 2022-10-26 NOTE — ED Triage Notes (Signed)
Pt c/o chest pain on her left side since 4 am. No n/v noted.

## 2022-10-26 NOTE — ED Provider Notes (Signed)
[facility] EMERGENCY DEPARTMENT Provider Note   CSN: [id number] Arrival date & time: 10/26/22  4259     History {Add pertinent medical, surgical, social history, OB history to HPI:1} Chief Complaint  Patient presents with   Chest Pain    Gina Coleman is a 46 y.o. female.  Patient as above with significant medical history as below, including gerd, htn who presents to the ED with complaint of mid sternal discomfort. Sudden onset sharp pain that is non radiating around 0400 this am. Worse with lying flat, improved with standing, feels similar to prior acid reflux. Takes omeprazole daily as prescribed by GI, no recent medication/diet changes. No recent etoh [place], +tobacco use. No change to bowel/bladder activity, no melena or brbpr. No ongoing abd pain. Initially had some DIB when pain started but this has subsided.      Past Medical History:  Diagnosis Date   Acid reflux    Anxiety    Headache    Hypertension    Hyperthyroidism     Past Surgical History:  Procedure Laterality Date   BIOPSY  06/05/2021   Procedure: BIOPSY;  Surgeon: Rogene Houston, MD;  [facility];  Service: Endoscopy;;   COLONOSCOPY N/A 06/05/2021   rehman:The examined portion of the ileum was normal. one 51m polyp in cecum (tubular adenoma). external hemorrhoids   ESOPHAGOGASTRODUODENOSCOPY N/A 06/05/2021   Rehman:Normal hypopharynx.normal esophagus, z line irregular 36 cm from incisors, gastritics with reactive gastropathy, normal duodenal bulb and second portion of duodenum   FOOT SURGERY Right    LEG SURGERY Right    TRANSFORAMINAL LUMBAR INTERBODY FUSION W/ MIS 1 LEVEL N/A 12/08/2021   Procedure: Lumbar Three-Four Minimally Invasive Transforaminal Lumbar Interbody Fusion;  Surgeon: OJudith Part MD;  [place] [facility]  Service: Neurosurgery;  Laterality: N/A;  Lumbar Three-Four Minimally Invasive Transforaminal Lumbar Interbody Fusion   TUBAL LIGATION       The history is  provided by the patient. No language interpreter was used.  Chest Pain Associated symptoms: no abdominal pain, no back pain, no cough, no dysphagia, no fever, no headache, no nausea, no palpitations and no shortness of breath        Home Medications Prior to Admission medications   Medication Sig Start Date End Date Taking? Authorizing Provider  amLODipine (NORVASC) 5 MG tablet TAKE 1 TABLET(5 MG) BY MOUTH DAILY 10/09/22   PLindell Spar MD  esomeprazole (NEXIUM) 40 MG packet Take 40 mg by mouth 2 (two) times daily. Patient taking differently: Take 40 mg by mouth daily as needed (heartburn). 09/04/21   Carlan, Chelsea L, NP  famotidine (PEPCID) 40 MG tablet Take 1 tablet (40 mg total) by mouth daily. In the evening 09/29/22   PLindell Spar MD  ibuprofen (ADVIL) 800 MG tablet Take 1 tablet (800 mg total) by mouth every 8 (eight) hours as needed. 08/11/22   GEstill Dooms NP  methimazole (TAPAZOLE) 5 MG tablet Take 1 tablet (5 mg total) by mouth daily. 02/11/22   Shamleffer, IMelanie Crazier MD  Omega-3 Fatty Acids (FISH OIL) 1200 MG CAPS Take 1,200 mg by mouth daily.    [provider]  triamcinolone cream (KENALOG) 0.1 % Apply 1 application topically daily as needed (Eczema).    [provider]  UNABLE TO FIND Black seed oil-daily    [provider]  vitamin B-6 (PYRIDOXINE) 25 MG tablet Take 1 tid 10/02/21   GDerrek MonacoA, NP  lisinopril (ZESTRIL) 5 MG tablet  Take by mouth. 04/14/19 09/21/19  [provider]      Allergies    Latex, Other, and Flexeril [cyclobenzaprine]    Review of Systems   Review of Systems  Constitutional:  Negative for activity change and fever.  HENT:  Negative for facial swelling and trouble swallowing.   Eyes:  Negative for discharge and redness.  Respiratory:  Negative for cough and shortness of breath.   Cardiovascular:  Positive for chest pain. Negative for palpitations.  Gastrointestinal:  Negative for  abdominal pain and nausea.  Genitourinary:  Negative for dysuria and flank pain.  Musculoskeletal:  Negative for back pain and gait problem.  Skin:  Negative for pallor and rash.  Neurological:  Negative for syncope and headaches.    Physical Exam Updated Vital Signs BP (!) 145/107   Pulse 71   Temp 98.1 F (36.7 C) (Oral)   Resp 19   Ht '5\' 4"'$  (1.626 m)   Wt 63.5 kg   LMP 10/26/2022 (Exact Date)   SpO2 100%   BMI 24.03 kg/m  Physical Exam Vitals and nursing note reviewed.  Constitutional:      General: She is not in acute distress.    Appearance: Normal appearance.  HENT:     Head: Normocephalic and atraumatic.     Right Ear: External ear normal.     Left Ear: External ear normal.     Nose: Nose normal.     Mouth/Throat:     Mouth: Mucous membranes are moist.  Eyes:     General: No scleral icterus.       Right eye: No discharge.        Left eye: No discharge.  Cardiovascular:     Rate and Rhythm: Normal rate and regular rhythm.     Pulses: Normal pulses.     Heart sounds: Normal heart sounds.  Pulmonary:     Effort: Pulmonary effort is normal. No respiratory distress.     Breath sounds: Normal breath sounds. No wheezing or rales.  Abdominal:     General: Abdomen is flat.     Palpations: Abdomen is soft.     Tenderness: There is no abdominal tenderness. There is no guarding or rebound.  Musculoskeletal:        General: Normal range of motion.     Cervical back: Normal range of motion.     Right lower leg: No edema.     Left lower leg: No edema.  Skin:    General: Skin is warm and dry.     Capillary Refill: Capillary refill takes less than 2 seconds.  Neurological:     Mental Status: She is alert and oriented to person, place, and time.     GCS: GCS eye subscore is 4. GCS verbal subscore is 5. GCS motor subscore is 6.  Psychiatric:        Mood and Affect: Mood normal.        Behavior: Behavior normal.     ED Results / Procedures / Treatments   Labs (all  labs ordered are listed, but only abnormal results are displayed) Labs Reviewed  BASIC METABOLIC PANEL  CBC  POC URINE PREG, ED  TROPONIN I (HIGH SENSITIVITY)    EKG None  Radiology No results found.  Procedures Procedures  {Document cardiac monitor, telemetry assessment procedure when appropriate:1}  Medications Ordered in ED Medications - No data to display  ED Course/ Medical Decision Making/ A&P  Medical Decision Making Amount and/or Complexity of Data Reviewed Labs: ordered. Radiology: ordered.  Risk OTC drugs. Prescription drug management.   This patient presents to the ED with chief complaint(s) of mid sternal pain with pertinent past medical history of gerd, htn which further complicates the presenting complaint. The complaint involves an extensive differential diagnosis and also carries with it a high risk of complications and morbidity.    Differential includes all life-threatening causes for chest pain. This includes but is not exclusive to acute coronary syndrome, aortic dissection, pulmonary embolism, cardiac tamponade, community-acquired pneumonia, pericarditis, musculoskeletal chest wall pain, etc.  . Serious etiologies were considered.   The initial plan is to screening labs/ gi cocktail   Additional history obtained: Additional history obtained from  na Records reviewed Primary Care Documents and home meds, prior labs/imaging   Independent labs interpretation:  The following labs were independently interpreted: ***  Independent visualization of imaging: - I independently visualized the following imaging with scope of interpretation limited to determining acute life threatening conditions related to emergency care: cxr, which revealed no acute findings  Cardiac monitoring was reviewed and interpreted by myself which shows NSR  Treatment and Reassessment: Gi cocktail Ivf >> improved    Consultation: - Consulted or  discussed management/test interpretation w/ external professional: ***  Consideration for admission or further workup: Admission was considered ***  Social Determinants of health: Social History   Tobacco Use   Smoking status: Some Days    Types: Cigarettes   Smokeless tobacco: Never  Vaping Use   Vaping Use: Never used  Substance Use Topics   Alcohol use: Yes    Alcohol/week: 3.0 standard drinks of alcohol    Types: 3 Glasses of wine per week    Comment: social    Drug use: Never      {Document critical care time when appropriate:1} {Document review of labs and clinical decision tools ie heart score, Chads2Vasc2 etc:1}  {Document your independent review of radiology images, and any outside records:1} {Document your discussion with family members, caretakers, and with consultants:1} {Document social determinants of health affecting pt's care:1} {Document your decision making why or why not admission, treatments were needed:1} Final Clinical Impression(s) / ED Diagnoses Final diagnoses:  None    Rx / DC Orders ED Discharge Orders     None

## 2022-10-26 NOTE — ED Notes (Signed)
Patient in hallway, fully dressed and states that she needs to leave. IV was removed by patient, verified by this nurse. States that she will look her her MyChart for results, etc. MD made aware.

## 2022-11-06 ENCOUNTER — Other Ambulatory Visit (INDEPENDENT_AMBULATORY_CARE_PROVIDER_SITE_OTHER): Payer: Self-pay | Admitting: Gastroenterology

## 2022-11-06 DIAGNOSIS — K219 Gastro-esophageal reflux disease without esophagitis: Secondary | ICD-10-CM

## 2022-11-19 ENCOUNTER — Ambulatory Visit: Payer: BC Managed Care – PPO | Admitting: Internal Medicine

## 2022-12-21 ENCOUNTER — Ambulatory Visit (INDEPENDENT_AMBULATORY_CARE_PROVIDER_SITE_OTHER): Payer: BC Managed Care – PPO | Admitting: Internal Medicine

## 2022-12-21 ENCOUNTER — Encounter: Payer: Self-pay | Admitting: Internal Medicine

## 2022-12-21 VITALS — BP 150/90 | HR 71 | Ht 64.0 in | Wt 215.8 lb

## 2022-12-21 DIAGNOSIS — E042 Nontoxic multinodular goiter: Secondary | ICD-10-CM

## 2022-12-21 DIAGNOSIS — E0590 Thyrotoxicosis, unspecified without thyrotoxic crisis or storm: Secondary | ICD-10-CM

## 2022-12-21 LAB — TSH: TSH: 1.26 u[IU]/mL (ref 0.35–5.50)

## 2022-12-21 LAB — T4, FREE: Free T4: 0.61 ng/dL (ref 0.60–1.60)

## 2022-12-21 MED ORDER — METHIMAZOLE 5 MG PO TABS
5.0000 mg | ORAL_TABLET | Freq: Every day | ORAL | 2 refills | Status: DC
Start: 2022-12-21 — End: 2024-01-28

## 2022-12-21 NOTE — Progress Notes (Signed)
Name: Gina Coleman  MRN/ DOB: [id number], August 28, 1976    Age/ Sex: 47 y.o., female     PCP: Lindell Spar, MD   Reason for Endocrinology Evaluation: [place]     Initial Endocrinology Clinic Visit: 07/12/2019    PATIENT IDENTIFIER: [name] is a 47 y.o., female with a past medical history of HTN. She has followed with [facility] since 07/12/2019 for consultative assistance with management of her 07/12/2019.   HISTORICAL SUMMARY: The patient was first  noted to have a low TSH at 0.399 uIU/mL during an evaluation for an enlarged cervical lymph node in 05/2019. She was started on Abx at the time.  Repeat TFT's were normal in 07/2019 . Ultrasound showed MNG . She is S/P benign FNA of the RUP nodule 08/2019   TRAb was undetectable  Methimazole started 03/2020 due to symptoms of hyperthyroidism    No FH of thyroid disorder  SUBJECTIVE:    Today (12/21/2022):  [name] is here for a follow up on subclinical hyperthyroidism.    Weight continues to fluctuate  Denies local neck swelling  Denies palpitations  Has occasional tremors  No loose stool or diarrhea    Methimazole 5 mg, once daily     HISTORY:  Past Medical History:  Past Medical History:  Diagnosis Date   Acid reflux    Anxiety    Headache    Hypertension    Hyperthyroidism    Past Surgical History:  Past Surgical History:  Procedure Laterality Date   BIOPSY  06/05/2021   Procedure: BIOPSY;  Surgeon: Rogene Houston, MD;  [facility];  Service: Endoscopy;;   COLONOSCOPY N/A 06/05/2021   rehman:The examined portion of the ileum was normal. one 66m polyp in cecum (tubular adenoma). external hemorrhoids   ESOPHAGOGASTRODUODENOSCOPY N/A 06/05/2021   Rehman:Normal hypopharynx.normal esophagus, z line irregular 36 cm from incisors, gastritics with reactive gastropathy, normal duodenal bulb and second portion of duodenum   FOOT SURGERY Right    LEG SURGERY Right     TRANSFORAMINAL LUMBAR INTERBODY FUSION W/ MIS 1 LEVEL N/A 12/08/2021   Procedure: Lumbar Three-Four Minimally Invasive Transforaminal Lumbar Interbody Fusion;  Surgeon: OJudith Part MD;  [place] [facility]  Service: Neurosurgery;  Laterality: N/A;  Lumbar Three-Four Minimally Invasive Transforaminal Lumbar Interbody Fusion   TUBAL LIGATION     Social History:  reports that she has been smoking cigarettes. She has never used smokeless tobacco. She reports current alcohol use of about 3.0 standard drinks of alcohol per week. She reports that she does not use drugs. Family History:  Family History  Problem Relation Age of Onset   CAD Mother        Premature disease   Hypertension Sister    Breast cancer Maternal Grandmother    HIV Sister    Asthma Son      HOME MEDICATIONS: Allergies as of 12/21/2022       Reactions   Latex Hives   Other Rash   GLOVE POWDER    Flexeril [cyclobenzaprine] Palpitations   Happened with 5 mg dose        Medication List        Accurate as of December 21, 2022 10:24 AM. If you have any questions, ask your nurse or doctor.          amLODipine 5 MG tablet Commonly known as: NORVASC TAKE 1 TABLET(5 MG) BY MOUTH DAILY What changed: See the new instructions.   cyanocobalamin  500 MCG tablet Commonly known as: VITAMIN B12 Take 500 mcg by mouth daily.   esomeprazole 40 MG packet Commonly known as: NexIUM Take 40 mg by mouth 2 (two) times daily. What changed:  when to take this reasons to take this   famotidine 40 MG tablet Commonly known as: PEPCID Take 1 tablet (40 mg total) by mouth daily. In the evening   Fish Oil 1200 MG Caps Take 1,200 mg by mouth daily.   ibuprofen 800 MG tablet Commonly known as: ADVIL Take 1 tablet (800 mg total) by mouth every 8 (eight) hours as needed. What changed: reasons to take this   methimazole 5 MG tablet Commonly known as: TAPAZOLE Take 1 tablet (5 mg total) by mouth daily. What changed:  additional instructions   triamcinolone cream 0.1 % Commonly known as: KENALOG Apply 1 application topically daily as needed (Eczema).   UNABLE TO FIND Black seed oil-daily          OBJECTIVE:   PHYSICAL EXAM: VS: BP (!) 150/90 (BP Location: Left Arm, Patient Position: Sitting, Cuff Size: Normal)   Pulse 71   Ht '5\' 4"'$  (1.626 m)   Wt 215 lb 12.8 oz (97.9 kg)   SpO2 98%   BMI 37.04 kg/m    EXAM: General: Pt appears well and is in NAD  Neck: General: Supple without adenopathy. Thyroid: Right thyroid  nodule appreciated.   Lungs: Clear with good BS bilat with no rales, rhonchi, or wheezes  Heart: Auscultation: RRR.  Abdomen: Normoactive bowel sounds, soft, nontender, without masses or organomegaly palpable  Extremities:  BL LE: No pretibial edema normal ROM and strength.  Mental Status: Judgment, insight: Intact Orientation: Oriented to time, place, and person Mood and affect: No depression, anxiety, or agitation     DATA REVIEWED:   Latest Reference Range & Units 12/21/22 10:32  TSH 0.35 - 5.50 uIU/mL 1.26  T4,Free(Direct) 0.60 - 1.60 ng/dL 0.61     Thyroid Ultrasound 02/05/2022   Estimated total number of nodules >/= 1 cm: 2   Number of spongiform nodules >/=  2 cm not described below (TR1): 0   Number of mixed cystic and solid nodules >/= 1.5 cm not described below ([place]): 0   _________________________________________________________   Nodule labeled 1 in the thyroid isthmus is a solid isoechoic TR 3 nodule that measures 2.6 x 2.2 x 1.4 cm, previously measuring up to a maximum dimension of 1.9 cm when measured in a similar fashion. It demonstrates interval growth since January 2022, and biopsy is recommended.   Nodule labeled 2 is a solid hypoechoic nodule in the right thyroid lobe measuring 3.9 x 3.1 x 1.5 cm, which remains similar in size and morphology previously measuring 3.9 cm in January 2022. This nodule was previously biopsied.    IMPRESSION: 1. Multinodular thyroid gland. 2. The TR 3 nodule in the thyroid isthmus demonstrates interval growth measuring 2.6 cm on today's exam, previously 1.9 cm in January 2022. Biopsy is recommended. 3. Stable appearance of nodule labeled 2 in the right thyroid lobe, which was previously biopsied. Correlate with biopsy results.   FNA (08/02/2019)   THYROID, FINE NEEDLE ASPIRATION, RUP (SPECIMEN 1 OF 1, COLLECTED 08/02/19): CONSISTENT WITH BENIGN FOLLICULAR NODULE (BETHESDA CATEGORY II).   FNA 02/25/2022  Clinical History: 2.6 cm Isthmus Specimen Submitted:  A. THYROID, ISTHMUS, FINE NEEDLE ASPIRATION:   FINAL MICROSCOPIC DIAGNOSIS: - Atypia of undetermined significance (Bethesda category III)   Afirma Benign    ASSESSMENT / PLAN /  RECOMMENDATIONS:   Multinodular Goiter :    -No local neck symptoms - S/P Benign FNA of the Right upper nodule 08/2019 - S/P FNA isthmic nodule with benign molecular testing 01/2022 -We will proceed with repeat thyroid ultrasound at [facility]  2. Hyperthyroidism :   - Pt is clinically euthyroid  - She was treated because she was symptomatic  - Most likely secondary to autonomous nodule  -  TFT's remain normal, but she has been noted with normal-low free T4, but I suspect this has to do with the fact that she had to double up recently because she forgot to take it 1 day.  I did discourage the patient from doubling up on methimazole and I have encouraged her to use a pillbox   Medication: Continue  methimazole 5 mg daily      F/U in 1 year    Signed electronically by: Mack Guise, MD  [facility]  [address] [place], [place] [address] Phone: [phone] FAX: [phone]      CC: Lindell Spar, [place] [address] Phone: [phone]  Fax: [phone]   Return to Endocrinology clinic as below: Future Appointments  Date Time  Provider [place]  12/21/2022 10:30 AM Victoriana Aziz, Melanie Crazier, MD [facility] None  12/23/2022  1:00 PM Lindell Spar, MD [facility]  02/18/2023  1:00 PM Del Eli Hose, FNP [facility]

## 2022-12-23 ENCOUNTER — Encounter: Payer: Self-pay | Admitting: Internal Medicine

## 2022-12-23 ENCOUNTER — Ambulatory Visit (INDEPENDENT_AMBULATORY_CARE_PROVIDER_SITE_OTHER): Payer: BC Managed Care – PPO | Admitting: Internal Medicine

## 2022-12-23 VITALS — BP 138/82 | HR 79 | Ht 64.0 in | Wt 214.8 lb

## 2022-12-23 DIAGNOSIS — Z0001 Encounter for general adult medical examination with abnormal findings: Secondary | ICD-10-CM | POA: Diagnosis not present

## 2022-12-23 DIAGNOSIS — E042 Nontoxic multinodular goiter: Secondary | ICD-10-CM

## 2022-12-23 DIAGNOSIS — I10 Essential (primary) hypertension: Secondary | ICD-10-CM | POA: Diagnosis not present

## 2022-12-23 DIAGNOSIS — Z2821 Immunization not carried out because of patient refusal: Secondary | ICD-10-CM

## 2022-12-23 DIAGNOSIS — Z981 Arthrodesis status: Secondary | ICD-10-CM

## 2022-12-23 DIAGNOSIS — K219 Gastro-esophageal reflux disease without esophagitis: Secondary | ICD-10-CM | POA: Diagnosis not present

## 2022-12-23 MED ORDER — OMEPRAZOLE 40 MG PO CPDR: 40.0000 mg | DELAYED_RELEASE_CAPSULE | Freq: Every day | ORAL | 3 refills | Status: DC | PRN

## 2022-12-23 NOTE — Assessment & Plan Note (Addendum)
Well-controlled overall, but has occasional heartburn On Pepcid 40 mg daily Added Omeprazole PRN

## 2022-12-23 NOTE — Assessment & Plan Note (Signed)
S/p L3-4 minimally invasive interbody fusion Back and leg pain better now, followed by spine surgery

## 2022-12-23 NOTE — Progress Notes (Signed)
Established Patient Office Visit  Subjective:  Patient ID: Gina Coleman, female    DOB: 1976/07/09  Age: 47 y.o. MRN: [id number]  CC:  Chief Complaint  Patient presents with   Annual Exam    HPI Gina Coleman is a 47 y.o. female with past medical history of hypertension and subclinical hyperthyroidism who presents for annual physical.  HTN: BP is well-controlled. Takes medications regularly. Patient denies headache, dizziness, chest pain, dyspnea or palpitations.   Hyperthyroidism: On Methimazole.  Followed by endocrinology.   She has been taking Pepcid daily for GERD.  Her acid reflux symptoms are usually well-controlled now, but occasionally has flareup of heartburn. She had an ER visit for it in 11/23.  Denies any dysphagia or odynophagia.     Past Medical History:  Diagnosis Date   Acid reflux    Anxiety    Headache    Hypertension    Hyperthyroidism     Past Surgical History:  Procedure Laterality Date   BIOPSY  06/05/2021   Procedure: BIOPSY;  Surgeon: Rogene Houston, MD;  [facility];  Service: Endoscopy;;   COLONOSCOPY N/A 06/05/2021   rehman:The examined portion of the ileum was normal. one 29m polyp in cecum (tubular adenoma). external hemorrhoids   ESOPHAGOGASTRODUODENOSCOPY N/A 06/05/2021   Rehman:Normal hypopharynx.normal esophagus, z line irregular 36 cm from incisors, gastritics with reactive gastropathy, normal duodenal bulb and second portion of duodenum   FOOT SURGERY Right    LEG SURGERY Right    TRANSFORAMINAL LUMBAR INTERBODY FUSION W/ MIS 1 LEVEL N/A 12/08/2021   Procedure: Lumbar Three-Four Minimally Invasive Transforaminal Lumbar Interbody Fusion;  Surgeon: OJudith Part MD;  Location: [facility]  Service: Neurosurgery;  Laterality: N/A;  Lumbar Three-Four Minimally Invasive Transforaminal Lumbar Interbody Fusion   TUBAL LIGATION      Family History  Problem Relation Age of Onset   CAD Mother        Premature disease    Hypertension Sister    Breast cancer Maternal Grandmother    HIV Sister    Asthma Son     Social History   Socioeconomic History   Marital status: Married    Spouse name: Not on file   Number of children: Not on file   Years of education: Not on file   Highest education level: Not on file  Occupational History   Not on file  Tobacco Use   Smoking status: Some Days    Types: Cigarettes   Smokeless tobacco: Never  Vaping Use   Vaping Use: Never used  Substance and Sexual Activity   Alcohol use: Yes    Alcohol/week: 3.0 standard drinks of alcohol    Types: 3 Glasses of wine per week    Comment: social    Drug use: Never   Sexual activity: Yes    Birth control/protection: Surgical    Comment: tubal  Other Topics Concern   Not on file  Social History Narrative   Not on file   Social Determinants of Health   Financial Resource Strain: Low Risk  (09/15/2022)   Overall Financial Resource Strain (CARDIA)    Difficulty of Paying Living Expenses: Not hard at all  Food Insecurity: No Food Insecurity (09/15/2022)   Hunger Vital Sign    Worried About Running Out of Food in the Last Year: Never true    Ran Out of Food in the Last Year: Never true  Transportation Needs: No Transportation Needs (09/15/2022)   PRAPARE - Transportation  Lack of Transportation (Medical): No    Lack of Transportation (Non-Medical): No  Physical Activity: Insufficiently Active (09/15/2022)   Exercise Vital Sign    Days of Exercise per Week: 2 days    Minutes of Exercise per Session: 10 min  Stress: No Stress Concern Present (09/15/2022)   [place]    Feeling of Stress : Not at all  Social Connections: Moderately Integrated (09/15/2022)   Social Connection and Isolation Panel [NHANES]    Frequency of Communication with Friends and Family: More than three times a week    Frequency of Social Gatherings with Friends and Family:  More than three times a week    Attends Religious Services: More than 4 times per year    Active Member of [employer] or Organizations: No    Attends Archivist Meetings: Never    Marital Status: Married  Human resources officer Violence: Not At Risk (09/15/2022)   Humiliation, Afraid, Rape, and Kick questionnaire    Fear of Current or Ex-Partner: No    Emotionally Abused: No    Physically Abused: No    Sexually Abused: No    Outpatient Medications Prior to Visit  Medication Sig Dispense Refill   amLODipine (NORVASC) 5 MG tablet TAKE 1 TABLET(5 MG) BY MOUTH DAILY (Patient taking differently: Take 5 mg by mouth daily.) 90 tablet 1   cyanocobalamin (VITAMIN B12) 500 MCG tablet Take 500 mcg by mouth daily.     famotidine (PEPCID) 40 MG tablet Take 1 tablet (40 mg total) by mouth daily. In the evening 90 tablet 3   ibuprofen (ADVIL) 800 MG tablet Take 1 tablet (800 mg total) by mouth every 8 (eight) hours as needed. (Patient taking differently: Take 800 mg by mouth every 8 (eight) hours as needed for mild pain.) 60 tablet 1   methimazole (TAPAZOLE) 5 MG tablet Take 1 tablet (5 mg total) by mouth daily. 90 tablet 2   Omega-3 Fatty Acids (FISH OIL) 1200 MG CAPS Take 1,200 mg by mouth daily.     triamcinolone cream (KENALOG) 0.1 % Apply 1 application topically daily as needed (Eczema).     UNABLE TO FIND Black seed oil-daily     esomeprazole (NEXIUM) 40 MG packet Take 40 mg by mouth 2 (two) times daily. (Patient taking differently: Take 40 mg by mouth daily as needed (heartburn).) 120 each 5   No facility-administered medications prior to visit.    Allergies  Allergen Reactions   Latex Hives   Other Rash     GLOVE POWDER     Flexeril [Cyclobenzaprine] Palpitations    Happened with 5 mg dose    ROS Review of Systems  Constitutional:  Negative for chills and fever.  HENT:  Negative for congestion, sinus pressure, sinus pain and sore throat.   Eyes:  Negative for pain and discharge.   Respiratory:  Negative for cough and shortness of breath.   Cardiovascular:  Negative for chest pain and palpitations.  Gastrointestinal:  Negative for diarrhea, nausea and vomiting.  Endocrine: Negative for polydipsia and polyuria.  Genitourinary:  Negative for dysuria and hematuria.  Musculoskeletal:  Negative for neck pain and neck stiffness.  Skin:  Negative for rash.  Neurological:  Negative for dizziness and weakness.  Psychiatric/Behavioral:  Negative for agitation and behavioral problems.       Objective:    Physical Exam Vitals reviewed.  Constitutional:      General: She is not in acute distress.  Appearance: She is obese. She is not diaphoretic.  HENT:     Head: Normocephalic and atraumatic.     Nose: Nose normal. No congestion.     Mouth/Throat:     Mouth: Mucous membranes are moist.     Pharynx: No posterior oropharyngeal erythema.  Eyes:     General: No scleral icterus.    Extraocular Movements: Extraocular movements intact.  Neck:     Thyroid: Thyromegaly present. No thyroid tenderness.  Cardiovascular:     Rate and Rhythm: Normal rate and regular rhythm.     Pulses: Normal pulses.     Heart sounds: Normal heart sounds. No murmur heard.    No friction rub. No gallop.  Pulmonary:     Breath sounds: Normal breath sounds. No wheezing or rales.  Abdominal:     Palpations: Abdomen is soft.     Tenderness: There is no abdominal tenderness.  Musculoskeletal:     Cervical back: Normal range of motion and neck supple. No tenderness.     Right lower leg: No edema.     Left lower leg: No edema.  Skin:    General: Skin is warm.     Findings: No rash.  Neurological:     General: No focal deficit present.     Mental Status: She is alert and oriented to person, place, and time.     Cranial Nerves: No cranial nerve deficit.     Sensory: No sensory deficit.     Motor: No weakness.  Psychiatric:        Mood and Affect: Mood normal.        Behavior: Behavior  normal.     BP 138/82 (BP Location: Left Arm, Patient Position: Sitting, Cuff Size: Large)   Pulse 79   Ht '5\' 4"'$  (1.626 m)   Wt 214 lb 12.8 oz (97.4 kg)   SpO2 99%   BMI 36.87 kg/m  Wt Readings from Last 3 Encounters:  12/23/22 214 lb 12.8 oz (97.4 kg)  12/21/22 215 lb 12.8 oz (97.9 kg)  10/26/22 140 lb (63.5 kg)    Lab Results  Component Value Date   TSH 1.26 12/21/2022   Lab Results  Component Value Date   WBC 5.0 10/26/2022   HGB 12.4 10/26/2022   HCT 38.9 10/26/2022   MCV 88.8 10/26/2022   PLT 240 10/26/2022   Lab Results  Component Value Date   NA 138 10/26/2022   K 4.1 10/26/2022   CO2 26 10/26/2022   GLUCOSE 102 (H) 10/26/2022   BUN 11 10/26/2022   CREATININE 0.67 10/26/2022   BILITOT 0.5 09/28/2022   ALKPHOS 124 (H) 09/28/2022   AST 12 09/28/2022   ALT 11 09/28/2022   PROT 7.2 09/28/2022   ALBUMIN 4.1 09/28/2022   CALCIUM 8.8 (L) 10/26/2022   ANIONGAP 7 10/26/2022   EGFR 90 09/28/2022   GFR 69.56 05/13/2020   Lab Results  Component Value Date   CHOL 162 09/28/2022   Lab Results  Component Value Date   HDL 53 09/28/2022   Lab Results  Component Value Date   LDLCALC 98 09/28/2022   Lab Results  Component Value Date   TRIG 55 09/28/2022   Lab Results  Component Value Date   CHOLHDL 3.1 09/28/2022   Lab Results  Component Value Date   HGBA1C 5.8 (H) 09/28/2022      Assessment & Plan:   Problem List Items Addressed This Visit       Cardiovascular and Mediastinum  Hypertension    Well-controlled On Amlodipine 5 mg QD Advised to follow DASH diet and perform moderate exercise as tolerated        Digestive   GERD (gastroesophageal reflux disease)    Well-controlled overall, but has occasional heartburn On Pepcid 40 mg daily Added Omeprazole PRN       Relevant Medications   omeprazole (PRILOSEC) 40 MG capsule     Endocrine   Multinodular goiter    On Methimazole 5 mg BID Follows Endocrinology Last TSH and T4 wnl         Other   S/P lumbar fusion    S/p L3-4 minimally invasive interbody fusion Back and leg pain better now, followed by spine surgery      Encounter for general adult medical examination with abnormal findings - Primary    Physical exam as documented. Counseling done  re healthy lifestyle involving commitment to 150 minutes exercise per week, heart healthy diet, and attaining healthy weight.The importance of adequate sleep also discussed. Changes in health habits are decided on by the patient with goals and time frames  set for achieving them. Immunization and cancer screening needs are specifically addressed at this visit.      Refused influenza vaccine    Meds ordered this encounter  Medications   omeprazole (PRILOSEC) 40 MG capsule    Sig: Take 1 capsule (40 mg total) by mouth daily as needed (Heartburn/acid reflux).    Dispense:  30 capsule    Refill:  3    Follow-up: Return in about 6 months (around 06/23/2023) for HTN.    Lindell Spar, MD

## 2022-12-23 NOTE — Patient Instructions (Signed)
Please continue taking medications as prescribed.  Please continue to follow low salt diet and perform moderate exercise/walking at least 150 mins/week. 

## 2022-12-23 NOTE — Assessment & Plan Note (Signed)
Well-controlled On Amlodipine 5 mg QD Advised to follow DASH diet and perform moderate exercise as tolerated

## 2022-12-23 NOTE — Assessment & Plan Note (Signed)

## 2022-12-23 NOTE — Assessment & Plan Note (Signed)
On Methimazole 5 mg BID Follows Endocrinology Last TSH and T4 wnl

## 2023-01-05 ENCOUNTER — Ambulatory Visit (HOSPITAL_COMMUNITY): Payer: BC Managed Care – PPO

## 2023-01-12 ENCOUNTER — Ambulatory Visit (HOSPITAL_COMMUNITY)
Admission: RE | Admit: 2023-01-12 | Discharge: 2023-01-12 | Disposition: A | Discharge status: Home / Self Care | Payer: BC Managed Care – PPO | Source: Ambulatory Visit | Attending: Internal Medicine | Admitting: Internal Medicine

## 2023-01-12 DIAGNOSIS — E042 Nontoxic multinodular goiter: Secondary | ICD-10-CM | POA: Diagnosis not present

## 2023-01-19 ENCOUNTER — Encounter: Payer: Self-pay | Admitting: Adult Health

## 2023-01-19 ENCOUNTER — Ambulatory Visit (INDEPENDENT_AMBULATORY_CARE_PROVIDER_SITE_OTHER): Payer: BC Managed Care – PPO | Admitting: Adult Health

## 2023-01-19 VITALS — BP 132/86 | HR 76 | Ht 64.0 in | Wt 220.5 lb

## 2023-01-19 DIAGNOSIS — L02224 Furuncle of groin: Secondary | ICD-10-CM

## 2023-01-19 MED ORDER — SULFAMETHOXAZOLE-TRIMETHOPRIM 800-160 MG PO TABS: 1.0000 | ORAL_TABLET | Freq: Two times a day (BID) | ORAL | 0 refills | Status: DC

## 2023-01-19 MED ORDER — FLUCONAZOLE 150 MG PO TABS: ORAL_TABLET | ORAL | 1 refills | Status: DC

## 2023-01-19 NOTE — Progress Notes (Signed)
  Subjective:     Patient ID: Gina Coleman, female   DOB: [birth year], 47 y.o.   MRN: [id number]  HPI Gina Coleman is a 47 year old black female, married, [phone] in complaining of lump in groin getting bigger, noticed about 4-5 days ago.  Last pap was negative HPV, NILM 06/14/20.  PCP is Dr Posey Pronto.  Review of Systems Lump in groin area getting bigger,noticed about 4-5 days ago. Reviewed past medical,surgical, social and family history. Reviewed medications and allergies.     Objective:   Physical Exam BP 132/86 (BP Location: Left Arm, Patient Position: Sitting, Cuff Size: Normal)   Pulse 76   Ht 5' 4"$  (1.626 m)   Wt 220 lb 8 oz (100 kg)   LMP 01/08/2023   BMI 37.85 kg/m   Skin warm and dry. Has oval tender, boil, right groin, about 1.5 x 3 cm, slightly red. Had shaved this area recently.  [facility] - 01/19/23 1358       Pregnancy Intention Screening   Does the patient want to become pregnant in the next year? No    Does the patient's partner want to become pregnant in the next year? No    Would the patient like to discuss contraceptive options today? No      Contraception Wrap Up   Current Method Female Sterilization    End Method Female Sterilization            Examination chaperoned by Levy Pupa LPN     Assessment:     1. Boil of groin Do not squeeze Use warm compresses Will rx septra ds and diflucan  Meds ordered this encounter  Medications   sulfamethoxazole-trimethoprim (BACTRIM DS) 800-160 MG tablet    Sig: Take 1 tablet by mouth 2 (two) times daily. Take 1 bid    Dispense:  14 tablet    Refill:  0    Order Specific Question:   Supervising Provider    Answer:   Elonda Husky, LUTHER H [[id number]]   fluconazole (DIFLUCAN) 150 MG tablet    Sig: Take 1 now and 1 in 3 days    Dispense:  2 tablet    Refill:  1    Order Specific Question:   Supervising Provider    Answer:   Florian Buff [[id number]]       Plan:     Follow up in 1 week for recheck with me

## 2023-01-26 ENCOUNTER — Ambulatory Visit: Payer: BC Managed Care – PPO | Admitting: Adult Health

## 2023-02-18 ENCOUNTER — Encounter: Payer: BC Managed Care – PPO | Admitting: Family Medicine

## 2023-02-18 ENCOUNTER — Encounter: Payer: Self-pay | Admitting: Internal Medicine

## 2023-04-09 ENCOUNTER — Ambulatory Visit: Payer: BC Managed Care – PPO | Admitting: Internal Medicine

## 2023-04-09 ENCOUNTER — Other Ambulatory Visit: Payer: Self-pay | Admitting: Internal Medicine

## 2023-04-09 ENCOUNTER — Encounter: Payer: Self-pay | Admitting: Internal Medicine

## 2023-04-09 VITALS — BP 125/83 | HR 83 | Ht 64.0 in | Wt 225.6 lb

## 2023-04-09 DIAGNOSIS — M7989 Other specified soft tissue disorders: Secondary | ICD-10-CM | POA: Insufficient documentation

## 2023-04-09 DIAGNOSIS — I10 Essential (primary) hypertension: Secondary | ICD-10-CM | POA: Diagnosis not present

## 2023-04-09 MED ORDER — FUROSEMIDE 20 MG PO TABS: 20.0000 mg | ORAL_TABLET | Freq: Every day | ORAL | 0 refills | Status: DC | PRN

## 2023-04-09 NOTE — Patient Instructions (Signed)
Please take Lasix once daily for next 3 days and then as needed for leg swelling.  Please follow low salt diet and perform leg elevation as tolerated.  Please try compression socks for leg swelling.

## 2023-04-09 NOTE — Progress Notes (Signed)
Acute Office Visit  Subjective:    Patient ID: Gina Coleman, female    DOB: [birth year], 47 y.o.   MRN: [id number]  Chief Complaint  Patient presents with   Joint Swelling    Ankle swelling started on 03/29/2023. Been evaluating at night and swelling goes down.    HPI Patient is in today for c/o bilateral ankle swelling, right more than left for the last 2 weeks, but has been worse for the last 5 days.  She had pulled pork at a [employer] in the last week.  She reports limiting salt intake and trying leg elevation as well.  Denies any dyspnea, orthopnea or PND currently.  She is on amlodipine for HTN, but has been on it for many years. BP is well-controlled. Takes medications regularly. Patient denies headache, dizziness, chest pain, dyspnea or palpitations.   Past Medical History:  Diagnosis Date   Acid reflux    Anxiety    Headache    Hypertension    Hyperthyroidism     Past Surgical History:  Procedure Laterality Date   BIOPSY  06/05/2021   Procedure: BIOPSY;  Surgeon: Malissa Hippo, MD;  [facility];  Service: Endoscopy;;   COLONOSCOPY N/A 06/05/2021   rehman:The examined portion of the ileum was normal. one 4mm polyp in cecum (tubular adenoma). external hemorrhoids   ESOPHAGOGASTRODUODENOSCOPY N/A 06/05/2021   Rehman:Normal hypopharynx.normal esophagus, z line irregular 36 cm from incisors, gastritics with reactive gastropathy, normal duodenal bulb and second portion of duodenum   FOOT SURGERY Right    LEG SURGERY Right    TRANSFORAMINAL LUMBAR INTERBODY FUSION W/ MIS 1 LEVEL N/A 12/08/2021   Procedure: Lumbar Three-Four Minimally Invasive Transforaminal Lumbar Interbody Fusion;  Surgeon: Jadene Pierini, MD;  Location: [facility];  Service: Neurosurgery;  Laterality: N/A;  Lumbar Three-Four Minimally Invasive Transforaminal Lumbar Interbody Fusion   TUBAL LIGATION      Family History  Problem Relation Age of Onset   CAD Mother        Premature  disease   Hypertension Sister    Breast cancer Maternal Grandmother    HIV Sister    Asthma Son     Social History   Socioeconomic History   Marital status: Married    Spouse name: Not on file   Number of children: Not on file   Years of education: Not on file   Highest education level: Not on file  Occupational History   Not on file  Tobacco Use   Smoking status: Some Days    Types: Cigarettes   Smokeless tobacco: Never  Vaping Use   Vaping Use: Never used  Substance and Sexual Activity   Alcohol use: Yes    Alcohol/week: 3.0 standard drinks of alcohol    Types: 3 Glasses of wine per week    Comment: social    Drug use: Never   Sexual activity: Yes    Birth control/protection: Surgical    Comment: tubal  Other Topics Concern   Not on file  Social History Narrative   Not on file   Social Determinants of Health   Financial Resource Strain: Low Risk  (09/15/2022)   Overall Financial Resource Strain (CARDIA)    Difficulty of Paying Living Expenses: Not hard at all  Food Insecurity: No Food Insecurity (09/15/2022)   Hunger Vital Sign    Worried About Running Out of Food in the Last Year: Never true    Ran Out of Food in the  Last Year: Never true  Transportation Needs: No Transportation Needs (09/15/2022)   PRAPARE - Administrator, Civil Service (Medical): No    Lack of Transportation (Non-Medical): No  Physical Activity: Insufficiently Active (09/15/2022)   Exercise Vital Sign    Days of Exercise per Week: 2 days    Minutes of Exercise per Session: 10 min  Stress: No Stress Concern Present (09/15/2022)   [employer] of Occupational Health - Occupational Stress Questionnaire    Feeling of Stress : Not at all  Social Connections: Moderately Integrated (09/15/2022)   Social Connection and Isolation Panel [NHANES]    Frequency of Communication with Friends and Family: More than three times a week    Frequency of Social Gatherings with Friends and  Family: More than three times a week    Attends Religious Services: More than 4 times per year    Active Member of [employer] or Organizations: No    Attends Banker Meetings: Never    Marital Status: Married  Catering manager Violence: Not At Risk (09/15/2022)   Humiliation, Afraid, Rape, and Kick questionnaire    Fear of Current or Ex-Partner: No    Emotionally Abused: No    Physically Abused: No    Sexually Abused: No    Outpatient Medications Prior to Visit  Medication Sig Dispense Refill   amLODipine (NORVASC) 5 MG tablet TAKE 1 TABLET(5 MG) BY MOUTH DAILY (Patient taking differently: Take 5 mg by mouth daily.) 90 tablet 1   cyanocobalamin (VITAMIN B12) 500 MCG tablet Take 500 mcg by mouth daily.     famotidine (PEPCID) 40 MG tablet Take 1 tablet (40 mg total) by mouth daily. In the evening 90 tablet 3   fluconazole (DIFLUCAN) 150 MG tablet Take 1 now and 1 in 3 days 2 tablet 1   ibuprofen (ADVIL) 800 MG tablet Take 1 tablet (800 mg total) by mouth every 8 (eight) hours as needed. (Patient taking differently: Take 800 mg by mouth every 8 (eight) hours as needed for mild pain.) 60 tablet 1   methimazole (TAPAZOLE) 5 MG tablet Take 1 tablet (5 mg total) by mouth daily. 90 tablet 2   Omega-3 Fatty Acids (FISH OIL) 1200 MG CAPS Take 1,200 mg by mouth daily.     omeprazole (PRILOSEC) 40 MG capsule Take 1 capsule (40 mg total) by mouth daily as needed (Heartburn/acid reflux). 30 capsule 3   sulfamethoxazole-trimethoprim (BACTRIM DS) 800-160 MG tablet Take 1 tablet by mouth 2 (two) times daily. Take 1 bid 14 tablet 0   triamcinolone cream (KENALOG) 0.1 % Apply 1 application topically daily as needed (Eczema).     UNABLE TO FIND Black seed oil-daily     No facility-administered medications prior to visit.    Allergies  Allergen Reactions   Latex Hives   Other Rash     GLOVE POWDER     Flexeril [Cyclobenzaprine] Palpitations    Happened with 5 mg dose    Review of Systems   Constitutional:  Negative for chills and fever.  HENT:  Negative for congestion, sinus pressure, sinus pain and sore throat.   Eyes:  Negative for pain and discharge.  Respiratory:  Negative for cough and shortness of breath.   Cardiovascular:  Positive for leg swelling. Negative for chest pain and palpitations.  Gastrointestinal:  Negative for diarrhea, nausea and vomiting.  Endocrine: Negative for polydipsia and polyuria.  Genitourinary:  Negative for dysuria and hematuria.  Musculoskeletal:  Negative for neck  pain and neck stiffness.  Skin:  Negative for rash.  Neurological:  Negative for dizziness and weakness.  Psychiatric/Behavioral:  Negative for agitation and behavioral problems.        Objective:    Physical Exam Vitals reviewed.  Constitutional:      General: She is not in acute distress.    Appearance: She is obese. She is not diaphoretic.  HENT:     Head: Normocephalic and atraumatic.     Nose: Nose normal. No congestion.     Mouth/Throat:     Mouth: Mucous membranes are moist.     Pharynx: No posterior oropharyngeal erythema.  Eyes:     General: No scleral icterus.    Extraocular Movements: Extraocular movements intact.  Neck:     Thyroid: Thyromegaly present. No thyroid tenderness.  Cardiovascular:     Rate and Rhythm: Normal rate and regular rhythm.     Pulses: Normal pulses.     Heart sounds: Normal heart sounds. No murmur heard.    No friction rub. No gallop.  Pulmonary:     Breath sounds: Normal breath sounds. No wheezing or rales.  Musculoskeletal:     Cervical back: Normal range of motion and neck supple. No tenderness.     Right lower leg: Edema (1+) present.     Left lower leg: Edema (Mild) present.  Skin:    General: Skin is warm.     Findings: No rash.  Neurological:     General: No focal deficit present.     Mental Status: She is alert and oriented to person, place, and time.     Cranial Nerves: No cranial nerve deficit.     Sensory: No  sensory deficit.     Motor: No weakness.  Psychiatric:        Mood and Affect: Mood normal.        Behavior: Behavior normal.     BP 125/83   Pulse 83   Ht 5\' 4"  (1.626 m)   Wt 225 lb 9.6 oz (102.3 kg)   SpO2 98%   BMI 38.72 kg/m  Wt Readings from Last 3 Encounters:  04/09/23 225 lb 9.6 oz (102.3 kg)  01/19/23 220 lb 8 oz (100 kg)  12/23/22 214 lb 12.8 oz (97.4 kg)        Assessment & Plan:   Problem List Items Addressed This Visit       Cardiovascular and Mediastinum   Hypertension    Well-controlled On Amlodipine 5 mg QD, her leg swelling is unlikely to be due to amlodipine Advised to follow DASH diet and perform moderate exercise as tolerated      Relevant Medications   furosemide (LASIX) 20 MG tablet     Other   Leg swelling - Primary    Could be due to recent high intake of salt and/or venous insufficiency, has remote history of right foot crush injury Perform leg elevation Avoid prolonged standing Low-salt diet Lasix as needed for persistent leg swelling      Relevant Medications   furosemide (LASIX) 20 MG tablet     Meds ordered this encounter  Medications   furosemide (LASIX) 20 MG tablet    Sig: Take 1 tablet (20 mg total) by mouth daily as needed.    Dispense:  30 tablet    Refill:  0     Ossie Yebra Concha Se, MD

## 2023-04-09 NOTE — Assessment & Plan Note (Signed)
Well-controlled On Amlodipine 5 mg QD, her leg swelling is unlikely to be due to amlodipine Advised to follow DASH diet and perform moderate exercise as tolerated

## 2023-04-09 NOTE — Assessment & Plan Note (Addendum)
Could be due to recent high intake of salt and/or venous insufficiency, has remote history of right foot crush injury Perform leg elevation Avoid prolonged standing Low-salt diet Lasix as needed for persistent leg swelling

## 2023-06-16 ENCOUNTER — Other Ambulatory Visit (HOSPITAL_COMMUNITY): Payer: Self-pay | Admitting: Adult Health

## 2023-06-16 DIAGNOSIS — Z1231 Encounter for screening mammogram for malignant neoplasm of breast: Secondary | ICD-10-CM

## 2023-06-22 ENCOUNTER — Ambulatory Visit: Payer: BC Managed Care – PPO | Admitting: Adult Health

## 2023-06-22 ENCOUNTER — Other Ambulatory Visit (HOSPITAL_COMMUNITY): Admission: RE | Admit: 2023-06-22 | Payer: BC Managed Care – PPO | Source: Ambulatory Visit

## 2023-06-22 ENCOUNTER — Encounter: Payer: Self-pay | Admitting: Adult Health

## 2023-06-22 VITALS — BP 121/77 | HR 80 | Ht 64.0 in | Wt 228.0 lb

## 2023-06-22 DIAGNOSIS — Z124 Encounter for screening for malignant neoplasm of cervix: Secondary | ICD-10-CM | POA: Insufficient documentation

## 2023-06-22 DIAGNOSIS — R1032 Left lower quadrant pain: Secondary | ICD-10-CM | POA: Diagnosis not present

## 2023-06-22 NOTE — Progress Notes (Signed)
  Subjective:     Patient ID: Gina Coleman, female   DOB: 08/10/1976, 47 y.o.   MRN: [id number]  HPI Gina Coleman is a 47 year old black female,married, [phone] in complaining of LLQ pain x 7 days started when period started.  She needs a pap.  PCP is Dr Allena Katz.   Review of Systems LLQ pain x 7 days, started with period Denies any problems with urination, bowel movement or sex  Reviewed past medical,surgical, social and family history. Reviewed medications and allergies.     Objective:   Physical Exam BP 121/77 (BP Location: Left Arm, Patient Position: Sitting, Cuff Size: Normal)   Pulse 80   Ht 5\' 4"  (1.626 m)   Wt 228 lb (103.4 kg)   LMP 06/16/2023   BMI 39.14 kg/m     Skin warm and dry.Pelvic: external genitalia is normal in appearance no lesions, vagina: pink,urethra has no lesions or masses noted, cervix:smooth and bulbous, pap with HR HPV genotyping performed, uterus: normal size, shape and contour, mildly tender, no masses felt, adnexa: no masses, LLQ tenderness noted over left ovary. Bladder is non tender and no masses felt. Fall risk is low  Upstream - 06/22/23 1524       Pregnancy Intention Screening   Does the patient want to become pregnant in the next year? No    Does the patient's partner want to become pregnant in the next year? No    Would the patient like to discuss contraceptive options today? No      Contraception Wrap Up   Current Method Female Sterilization    End Method Female Sterilization    Contraception Counseling Provided No            Examination chaperoned by Malachy Mood LPN  Assessment:     1. LLQ pain +LLQ pain x 7 days She has ibuprofen if needed for pain  Scheduled pelvic US for 06/30/23 at [facility] at 2:30 pm to assess uterus and ovaries - US PELVIC COMPLETE WITH TRANSVAGINAL; Future  2. Routine Papanicolaou smear Pap sent Pap in 3 years if normal  - Cytology - PAP( )     Plan:     Follow up prn

## 2023-06-24 LAB — CYTOLOGY - PAP: High risk HPV: NEGATIVE

## 2023-06-28 ENCOUNTER — Ambulatory Visit (INDEPENDENT_AMBULATORY_CARE_PROVIDER_SITE_OTHER): Payer: BC Managed Care – PPO

## 2023-06-28 ENCOUNTER — Ambulatory Visit
Admission: EM | Admit: 2023-06-28 | Discharge: 2023-06-28 | Disposition: A | Discharge status: Home / Self Care | Payer: BC Managed Care – PPO | Attending: Nurse Practitioner | Admitting: Nurse Practitioner

## 2023-06-28 DIAGNOSIS — M25512 Pain in left shoulder: Secondary | ICD-10-CM

## 2023-06-28 MED ORDER — KETOROLAC TROMETHAMINE 30 MG/ML IJ SOLN
30.0000 mg | Freq: Once | INTRAMUSCULAR | Status: AC
  Administered 2023-06-28: 30 mg via INTRAMUSCULAR

## 2023-06-28 MED ORDER — PREDNISONE 20 MG PO TABS: 40.0000 mg | ORAL_TABLET | Freq: Every day | ORAL | 0 refills | Status: DC

## 2023-06-28 MED ORDER — DEXAMETHASONE SODIUM PHOSPHATE 10 MG/ML IJ SOLN
10.0000 mg | INTRAMUSCULAR | Status: AC
  Administered 2023-06-28: 10 mg via INTRAMUSCULAR

## 2023-06-28 NOTE — Discharge Instructions (Addendum)
You were given an injection of Toradol 30 mg and Decadron 10 mg.  Do not take any ibuprofen today.  If you have breakthrough pain, you may take Tylenol arthritis strength 650 mg tablets which she can purchase over-the-counter.  Continue Tylenol while you are taking the prednisone.  When you complete the prednisone, you can resume taking ibuprofen. X-rays were negative for fracture or dislocation. Take medication as prescribed. May apply ice or heat as needed.  Apply ice for pain or swelling, heat for spasm or stiffness.  Apply for 20 minutes, remove for 1 hour, repeat as needed. Gentle range of motion exercises to help decrease recovery time.  I have provided exercises for you to perform at home.  Recommend doing these at least 2-3 times daily. As discussed, if symptoms or not improved with this treatment, recommend following up with orthopedics.  You can follow-up with [facility] or with [facility] [place]. Follow-up as needed.

## 2023-06-28 NOTE — ED Triage Notes (Signed)
Pt c/o left shoulder pain pt states it started Thursday she noticed the pain the Saturday after washing her hair she noticed the pain increased. Sunday morning  she took Ibuprofen, used Ice and heat and nothing seemed to help.

## 2023-06-28 NOTE — ED Provider Notes (Signed)
[facility]    CSN: [id number] Arrival date & time: 06/28/23  1132      History   Chief Complaint No chief complaint on file.   HPI Gina Coleman is a 47 y.o. female.   The history is provided by the patient.   The patient presents with a 2-day history of left shoulder pain.  Patient states that she "does not care".  She states that she did hear over the weekend, but on 2 days ago, symptoms worsen.  She states she developed a "burning" pain to the front part of her left shoulder with decreased range of motion.  She also complains of numbness and tingling in the left hand.  Patient denies injury, trauma, swelling, radiation of pain, or bruising.  Patient reports that she did apply ice, and took ibuprofen with some relief.  Past Medical History:  Diagnosis Date   Acid reflux    Anxiety    Headache    Hypertension    Hyperthyroidism     Patient Active Problem List   Diagnosis Date Noted   LLQ pain 06/22/2023   Leg swelling 04/09/2023   Boil of groin 01/19/2023   Encounter for general adult medical examination with abnormal findings 12/23/2022   Refused influenza vaccine 12/23/2022   Perimenopause 08/11/2022   Hemorrhoids 07/01/2022   S/P lumbar fusion 01/22/2022   Spondylolisthesis of lumbar region 12/08/2021   Family history of breast cyst 10/02/2021   Mass of upper outer quadrant of left breast 10/02/2021   Breast cyst, left 10/02/2021   GERD (gastroesophageal reflux disease) 02/11/2021   Fibroids, subserous 09/13/2020   Dysmenorrhea 09/13/2020   Menorrhagia with regular cycle 09/13/2020   Peripheral neuropathy 08/22/2020   Chronic back pain 06/14/2020   Hypertension 11/18/2019   Subclinical hyperthyroidism 09/08/2019   Multinodular goiter 07/12/2019   Chest pain 02/23/2018    Past Surgical History:  Procedure Laterality Date   BIOPSY  06/05/2021   Procedure: BIOPSY;  Surgeon: Malissa Hippo, MD;  [facility];  Service:  Endoscopy;;   COLONOSCOPY N/A 06/05/2021   rehman:The examined portion of the ileum was normal. one 4mm polyp in cecum (tubular adenoma). external hemorrhoids   ESOPHAGOGASTRODUODENOSCOPY N/A 06/05/2021   Rehman:Normal hypopharynx.normal esophagus, z line irregular 36 cm from incisors, gastritics with reactive gastropathy, normal duodenal bulb and second portion of duodenum   FOOT SURGERY Right    LEG SURGERY Right    TRANSFORAMINAL LUMBAR INTERBODY FUSION W/ MIS 1 LEVEL N/A 12/08/2021   Procedure: Lumbar Three-Four Minimally Invasive Transforaminal Lumbar Interbody Fusion;  Surgeon: Jadene Pierini, MD;  Location: [facility];  Service: Neurosurgery;  Laterality: N/A;  Lumbar Three-Four Minimally Invasive Transforaminal Lumbar Interbody Fusion   TUBAL LIGATION      OB History     Gravida  3   Para  3   Term  1   Preterm  2   AB      Living  3      SAB      IAB      Ectopic      Multiple      Live Births  3            Home Medications    Prior to Admission medications   Medication Sig Start Date End Date Taking? Authorizing Provider  amLODipine (NORVASC) 5 MG tablet TAKE 1 TABLET(5 MG) BY MOUTH DAILY Patient taking differently: Take 5 mg by mouth daily. 10/09/22   Allena Katz, Dondra Prader  K, MD  famotidine (PEPCID) 40 MG tablet Take 1 tablet (40 mg total) by mouth daily. In the evening 09/29/22   Anabel Halon, MD  furosemide (LASIX) 20 MG tablet Take 1 tablet (20 mg total) by mouth daily as needed. 04/09/23   Anabel Halon, MD  ibuprofen (ADVIL) 800 MG tablet Take 1 tablet (800 mg total) by mouth every 8 (eight) hours as needed. Patient taking differently: Take 800 mg by mouth every 8 (eight) hours as needed for mild pain. 08/11/22   Adline Potter, NP  methimazole (TAPAZOLE) 5 MG tablet Take 1 tablet (5 mg total) by mouth daily. 12/21/22   Shamleffer, Konrad Dolores, MD  Omega-3 Fatty Acids (FISH OIL) 1200 MG CAPS Take 1,200 mg by mouth daily.    [provider]  omeprazole (PRILOSEC) 40 MG capsule Take 1 capsule (40 mg total) by mouth daily as needed (Heartburn/acid reflux). 12/23/22   Anabel Halon, MD  triamcinolone cream (KENALOG) 0.1 % Apply 1 application topically daily as needed (Eczema).    [provider]  UNABLE TO FIND Black seed oil-daily    [provider]  lisinopril (ZESTRIL) 5 MG tablet Take by mouth. 04/14/19 09/21/19  [provider]    Family History Family History  Problem Relation Age of Onset   CAD Mother        Premature disease   Hypertension Sister    Breast cancer Maternal Grandmother    HIV Sister    Asthma Son     Social History Social History   Tobacco Use   Smoking status: Some Days    Types: Cigarettes   Smokeless tobacco: Never  Vaping Use   Vaping status: Never Used  Substance Use Topics   Alcohol use: Yes    Alcohol/week: 3.0 standard drinks of alcohol    Types: 3 Glasses of wine per week    Comment: social    Drug use: Never     Allergies   Other and Flexeril [cyclobenzaprine]   Review of Systems Review of Systems Per HPI  Physical Exam Triage Vital Signs ED Triage Vitals  Encounter Vitals Group     BP 06/28/23 1138 (!) 157/113     Systolic BP Percentile --      Diastolic BP Percentile --      Pulse Rate 06/28/23 1138 90     Resp --      Temp 06/28/23 1138 98.2 F (36.8 C)     Temp Source 06/28/23 1138 Oral     SpO2 06/28/23 1138 98 %     Weight --      Height --      Head Circumference --      Peak Flow --      Pain Score 06/28/23 1141 8     Pain Loc --      Pain Education --      Exclude from Growth Chart --    No data found.  Updated Vital Signs BP (!) 161/102 (BP Location: Right Arm)   Pulse 90   Temp 98.2 F (36.8 C) (Oral)   LMP 06/16/2023   SpO2 98%   Visual Acuity Right Eye Distance:   Left Eye Distance:   Bilateral Distance:    Right Eye Near:   Left Eye Near:    Bilateral Near:     Physical Exam Vitals and nursing  note reviewed.  Constitutional:      General: She is not in acute  distress.    Appearance: Normal appearance.  HENT:     Head: Normocephalic.  Eyes:     Extraocular Movements: Extraocular movements intact.     Pupils: Pupils are equal, round, and reactive to light.  Cardiovascular:     Rate and Rhythm: Normal rate and regular rhythm.  Pulmonary:     Effort: Pulmonary effort is normal.  Musculoskeletal:     Left shoulder: Tenderness (Tenderness noted to the anterior shoulder at the [facility] joint) present. No swelling or deformity. Decreased range of motion (With internal/external rotation, and with extension). Decreased strength (Left hand). Normal pulse.     Cervical back: Normal range of motion.  Skin:    General: Skin is warm and dry.  Neurological:     General: No focal deficit present.     Mental Status: She is alert and oriented to person, place, and time.  Psychiatric:        Mood and Affect: Mood normal.        Behavior: Behavior normal.      UC Treatments / Results  Labs (all labs ordered are listed, but only abnormal results are displayed) Labs Reviewed - No data to display  EKG   Radiology DG Shoulder Left  Result Date: 06/28/2023 CLINICAL DATA:  Left shoulder pain for 4 days. EXAM: LEFT SHOULDER - 2+ VIEW COMPARISON:  None Available. FINDINGS: The glenohumeral and acromioclavicular joint spaces are normally aligned and maintained. No significant osteoarthritis. No acute fracture or dislocation. The visualized portions of the lungs are unremarkable. IMPRESSION: Negative. Electronically Signed   By: Neita Garnet M.D.   On: 06/28/2023 12:47    Procedures Procedures (including critical care time)  Medications Ordered in UC Medications  ketorolac (TORADOL) 30 MG/ML injection 30 mg (30 mg Intramuscular Given 06/28/23 1246)  dexamethasone (DECADRON) injection 10 mg (10 mg Intramuscular Given 06/28/23 1246)    Initial Impression / Assessment and Plan / UC Course  I  have reviewed the triage vital signs and the nursing notes.  Pertinent labs & imaging results that were available during my care of the patient were reviewed by me and considered in my medical decision making (see chart for details).  The patient is well-appearing, she is in no acute distress, vital signs are stable.  X-rays were negative for fracture or dislocation.  Tenderness noted at the [facility] joint, suspect and inflammation of the nerve or tendon.  Decadron 10 mg IM and Toradol 30 mg IM administered.  Will start patient on prednisone 40 mg for the next 5 days.  Supportive care recommendations were provided and discussed with the patient to include over-the-counter analgesics for pain or discomfort, continuing ice or heat, and gentle range of motion exercises.  Patient advised if symptoms or not improving with this treatment, recommend following up with orthopedics for further evaluation.  Patient was given information for [facility] [place] and for [facility].  Patient is in agreement with this plan of care and verbalizes understanding.  All questions were answered.  Patient stable for discharge.   Final Clinical Impressions(s) / UC Diagnoses   Final diagnoses:  Left shoulder pain, unspecified chronicity     Discharge Instructions      You were given an injection of Toradol 30 mg and Decadron 10 mg.  Do not take any ibuprofen today.  If you have breakthrough pain, you may take Tylenol arthritis strength 650 mg tablets which she can purchase over-the-counter.  Continue Tylenol while you are taking the prednisone.  When you complete the prednisone, you can resume taking ibuprofen. X-rays were negative for fracture or dislocation. Take medication as prescribed. May apply ice or heat as needed.  Apply ice for pain or swelling, heat for spasm or stiffness.  Apply for 20 minutes, remove for 1 hour, repeat as needed. Gentle range of motion exercises to help decrease recovery time. As  discussed, if symptoms or not improved with this treatment, recommend following up with orthopedics.  You can follow-up with [facility] or with [facility] . Follow-up as needed.     ED Prescriptions   None    PDMP not reviewed this encounter.   Abran Cantor, NP 06/28/23 1306

## 2023-06-30 ENCOUNTER — Ambulatory Visit (HOSPITAL_COMMUNITY)
Admission: RE | Admit: 2023-06-30 | Discharge: 2023-06-30 | Disposition: A | Discharge status: Home / Self Care | Payer: BC Managed Care – PPO | Source: Ambulatory Visit | Attending: Adult Health | Admitting: Adult Health

## 2023-06-30 ENCOUNTER — Ambulatory Visit: Payer: BC Managed Care – PPO | Admitting: Internal Medicine

## 2023-06-30 ENCOUNTER — Encounter: Payer: Self-pay | Admitting: Internal Medicine

## 2023-06-30 VITALS — BP 132/81 | HR 88 | Ht 64.0 in | Wt 213.4 lb

## 2023-06-30 DIAGNOSIS — R1032 Left lower quadrant pain: Secondary | ICD-10-CM | POA: Insufficient documentation

## 2023-06-30 DIAGNOSIS — D252 Subserosal leiomyoma of uterus: Secondary | ICD-10-CM

## 2023-06-30 DIAGNOSIS — K219 Gastro-esophageal reflux disease without esophagitis: Secondary | ICD-10-CM | POA: Diagnosis not present

## 2023-06-30 DIAGNOSIS — D259 Leiomyoma of uterus, unspecified: Secondary | ICD-10-CM | POA: Diagnosis not present

## 2023-06-30 DIAGNOSIS — I10 Essential (primary) hypertension: Secondary | ICD-10-CM | POA: Diagnosis not present

## 2023-06-30 DIAGNOSIS — E042 Nontoxic multinodular goiter: Secondary | ICD-10-CM | POA: Diagnosis not present

## 2023-06-30 DIAGNOSIS — M25512 Pain in left shoulder: Secondary | ICD-10-CM

## 2023-06-30 MED ORDER — AMLODIPINE BESYLATE 5 MG PO TABS
5.0000 mg | ORAL_TABLET | Freq: Every day | ORAL | 1 refills | Status: DC
Start: 2023-06-30 — End: 2024-01-28

## 2023-06-30 NOTE — Patient Instructions (Signed)
Please continue to take medications as prescribed.  Please continue to follow low salt diet and perform moderate exercise/walking at least 150 mins/week. 

## 2023-06-30 NOTE — Assessment & Plan Note (Addendum)
On Methimazole 5 mg BID Follows Endocrinology Last US thyroid (02/24) reviewed Last TSH and T4 wnl

## 2023-06-30 NOTE — Assessment & Plan Note (Signed)
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

## 2023-06-30 NOTE — Progress Notes (Signed)
Established Patient Office Visit  Subjective:  Patient ID: Gina Coleman, female    DOB: 20-May-1976  Age: 47 y.o. MRN: [id number]  CC:  Chief Complaint  Patient presents with   Hypertension    Six month follow up     HPI Gina Coleman is a 47 y.o. female with past medical history of hypertension and subclinical hyperthyroidism who presents for f/u of her chronic medical conditions.  HTN: BP is well-controlled. Takes medications regularly. Patient denies headache, dizziness, chest pain, dyspnea or palpitations.  Hyperthyroidism: On Methimazole.  Followed by endocrinology.   She has been taking Pepcid PRN for GERD.  Her acid reflux symptoms are better now.  Denies any dysphagia or odynophagia.  She had had an episode of left shoulder pain, radiating towards left hand in the last week.  She went to urgent care and was given oral prednisone.  She has noticed significant improvement in her shoulder pain since then.  Denies any numbness or tingling of the hand today.  Past Medical History:  Diagnosis Date   Acid reflux    Anxiety    Headache    Hypertension    Hyperthyroidism     Past Surgical History:  Procedure Laterality Date   BIOPSY  06/05/2021   Procedure: BIOPSY;  Surgeon: Malissa Hippo, MD;  Location: [facility];  Service: Endoscopy;;   COLONOSCOPY N/A 06/05/2021   rehman:The examined portion of the ileum was normal. one 4mm polyp in cecum (tubular adenoma). external hemorrhoids   ESOPHAGOGASTRODUODENOSCOPY N/A 06/05/2021   Rehman:Normal hypopharynx.normal esophagus, z line irregular 36 cm from incisors, gastritics with reactive gastropathy, normal duodenal bulb and second portion of duodenum   FOOT SURGERY Right    LEG SURGERY Right    TRANSFORAMINAL LUMBAR INTERBODY FUSION W/ MIS 1 LEVEL N/A 12/08/2021   Procedure: Lumbar Three-Four Minimally Invasive Transforaminal Lumbar Interbody Fusion;  Surgeon: Jadene Pierini, MD;  Location: [facility];  Service:  Neurosurgery;  Laterality: N/A;  Lumbar Three-Four Minimally Invasive Transforaminal Lumbar Interbody Fusion   TUBAL LIGATION      Family History  Problem Relation Age of Onset   CAD Mother        Premature disease   Hypertension Sister    Breast cancer Maternal Grandmother    HIV Sister    Asthma Son     Social History   Socioeconomic History   Marital status: Married    Spouse name: Not on file   Number of children: Not on file   Years of education: Not on file   Highest education level: Not on file  Occupational History   Not on file  Tobacco Use   Smoking status: Some Days    Types: Cigarettes   Smokeless tobacco: Never  Vaping Use   Vaping status: Never Used  Substance and Sexual Activity   Alcohol use: Yes    Alcohol/week: 3.0 standard drinks of alcohol    Types: 3 Glasses of wine per week    Comment: social    Drug use: Never   Sexual activity: Yes    Birth control/protection: Surgical    Comment: tubal  Other Topics Concern   Not on file  Social History Narrative   Not on file   Social Determinants of Health   Financial Resource Strain: Low Risk  (09/15/2022)   Overall Financial Resource Strain (CARDIA)    Difficulty of Paying Living Expenses: Not hard at all  Food Insecurity: No Food Insecurity (09/15/2022)   Hunger  Vital Sign    Worried About Programme researcher, broadcasting/film/video in the Last Year: Never true    Ran Out of Food in the Last Year: Never true  Transportation Needs: No Transportation Needs (09/15/2022)   PRAPARE - Administrator, Civil Service (Medical): No    Lack of Transportation (Non-Medical): No  Physical Activity: Insufficiently Active (09/15/2022)   Exercise Vital Sign    Days of Exercise per Week: 2 days    Minutes of Exercise per Session: 10 min  Stress: No Stress Concern Present (09/15/2022)   [employer] of Occupational Health - Occupational Stress Questionnaire    Feeling of Stress : Not at all  Social Connections:  Moderately Integrated (09/15/2022)   Social Connection and Isolation Panel [NHANES]    Frequency of Communication with Friends and Family: More than three times a week    Frequency of Social Gatherings with Friends and Family: More than three times a week    Attends Religious Services: More than 4 times per year    Active Member of [employer] or Organizations: No    Attends Banker Meetings: Never    Marital Status: Married  Catering manager Violence: Not At Risk (09/15/2022)   Humiliation, Afraid, Rape, and Kick questionnaire    Fear of Current or Ex-Partner: No    Emotionally Abused: No    Physically Abused: No    Sexually Abused: No    Outpatient Medications Prior to Visit  Medication Sig Dispense Refill   amLODipine (NORVASC) 5 MG tablet TAKE 1 TABLET(5 MG) BY MOUTH DAILY (Patient taking differently: Take 5 mg by mouth daily.) 90 tablet 1   famotidine (PEPCID) 40 MG tablet Take 1 tablet (40 mg total) by mouth daily. In the evening 90 tablet 3   furosemide (LASIX) 20 MG tablet Take 1 tablet (20 mg total) by mouth daily as needed. 30 tablet 0   ibuprofen (ADVIL) 800 MG tablet Take 1 tablet (800 mg total) by mouth every 8 (eight) hours as needed. (Patient taking differently: Take 800 mg by mouth every 8 (eight) hours as needed for mild pain.) 60 tablet 1   methimazole (TAPAZOLE) 5 MG tablet Take 1 tablet (5 mg total) by mouth daily. 90 tablet 2   Omega-3 Fatty Acids (FISH OIL) 1200 MG CAPS Take 1,200 mg by mouth daily.     omeprazole (PRILOSEC) 40 MG capsule Take 1 capsule (40 mg total) by mouth daily as needed (Heartburn/acid reflux). 30 capsule 3   predniSONE (DELTASONE) 20 MG tablet Take 2 tablets (40 mg total) by mouth daily with breakfast for 5 days. 10 tablet 0   triamcinolone cream (KENALOG) 0.1 % Apply 1 application topically daily as needed (Eczema).     UNABLE TO FIND Black seed oil-daily     No facility-administered medications prior to visit.    Allergies   Allergen Reactions   Other Rash     GLOVE POWDER     Flexeril [Cyclobenzaprine] Palpitations    Happened with 5 mg dose    ROS Review of Systems  Constitutional:  Negative for chills and fever.  HENT:  Negative for congestion, sinus pressure, sinus pain and sore throat.   Eyes:  Negative for pain and discharge.  Respiratory:  Negative for cough and shortness of breath.   Cardiovascular:  Negative for chest pain and palpitations.  Gastrointestinal:  Negative for diarrhea, nausea and vomiting.  Endocrine: Negative for polydipsia and polyuria.  Genitourinary:  Negative for dysuria and  hematuria.  Musculoskeletal:  Negative for neck pain and neck stiffness.  Skin:  Negative for rash.  Neurological:  Negative for dizziness and weakness.  Psychiatric/Behavioral:  Negative for agitation and behavioral problems.       Objective:    Physical Exam Vitals reviewed.  Constitutional:      General: She is not in acute distress.    Appearance: She is obese. She is not diaphoretic.  HENT:     Head: Normocephalic and atraumatic.     Nose: Nose normal. No congestion.     Mouth/Throat:     Mouth: Mucous membranes are moist.     Pharynx: No posterior oropharyngeal erythema.  Eyes:     General: No scleral icterus.    Extraocular Movements: Extraocular movements intact.  Neck:     Thyroid: Thyromegaly present. No thyroid tenderness.  Cardiovascular:     Rate and Rhythm: Normal rate and regular rhythm.     Heart sounds: Normal heart sounds. No murmur heard. Pulmonary:     Breath sounds: Normal breath sounds. No wheezing or rales.  Musculoskeletal:     Cervical back: Normal range of motion. No rigidity.     Right lower leg: No edema.     Left lower leg: No edema.  Skin:    General: Skin is warm.     Findings: No rash.  Neurological:     General: No focal deficit present.     Mental Status: She is alert and oriented to person, place, and time.     Sensory: No sensory deficit.      Motor: No weakness.  Psychiatric:        Mood and Affect: Mood normal.        Behavior: Behavior normal.     BP 132/81 (BP Location: Right Arm, Patient Position: Sitting, Cuff Size: Large)   Pulse 88   Ht 5\' 4"  (1.626 m)   Wt 213 lb 6.4 oz (96.8 kg)   LMP 06/16/2023   SpO2 98%   BMI 36.63 kg/m  Wt Readings from Last 3 Encounters:  06/30/23 213 lb 6.4 oz (96.8 kg)  06/22/23 228 lb (103.4 kg)  04/09/23 225 lb 9.6 oz (102.3 kg)    Lab Results  Component Value Date   TSH 1.26 12/21/2022   Lab Results  Component Value Date   WBC 5.0 10/26/2022   HGB 12.4 10/26/2022   HCT 38.9 10/26/2022   MCV 88.8 10/26/2022   PLT 240 10/26/2022   Lab Results  Component Value Date   NA 138 10/26/2022   K 4.1 10/26/2022   CO2 26 10/26/2022   GLUCOSE 102 (H) 10/26/2022   BUN 11 10/26/2022   CREATININE 0.67 10/26/2022   BILITOT 0.5 09/28/2022   ALKPHOS 124 (H) 09/28/2022   AST 12 09/28/2022   ALT 11 09/28/2022   PROT 7.2 09/28/2022   ALBUMIN 4.1 09/28/2022   CALCIUM 8.8 (L) 10/26/2022   ANIONGAP 7 10/26/2022   EGFR 90 09/28/2022   GFR 69.56 05/13/2020   Lab Results  Component Value Date   CHOL 162 09/28/2022   Lab Results  Component Value Date   HDL 53 09/28/2022   Lab Results  Component Value Date   LDLCALC 98 09/28/2022   Lab Results  Component Value Date   TRIG 55 09/28/2022   Lab Results  Component Value Date   CHOLHDL 3.1 09/28/2022   Lab Results  Component Value Date   HGBA1C 5.8 (H) 09/28/2022  Assessment & Plan:   Problem List Items Addressed This Visit    Problem List Items Addressed This Visit       Cardiovascular and Mediastinum   Hypertension - Primary    Well-controlled On Amlodipine 5 mg QD, her leg swelling is unlikely to be due to amlodipine Advised to follow DASH diet and perform moderate exercise as tolerated      Relevant Medications   amLODipine (NORVASC) 5 MG tablet     Digestive   GERD (gastroesophageal reflux disease)     Well-controlled overall, but has occasional heartburn On Pepcid 40 mg QD On Omeprazole PRN        Endocrine   Multinodular goiter    On Methimazole 5 mg BID Follows Endocrinology Last US thyroid (02/24) reviewed Last TSH and T4 wnl        Genitourinary   Fibroids, subserous    Has intermittent abdominal pain Planned to get [place] of pelvis today        Other   Acute pain of left shoulder    Since 06/26/23 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   Follow-up: No follow-ups on file.    Anabel Halon, MD

## 2023-06-30 NOTE — Assessment & Plan Note (Addendum)
Has intermittent abdominal pain Planned to get [place] of pelvis today

## 2023-06-30 NOTE — Assessment & Plan Note (Addendum)
Well-controlled overall, but has occasional heartburn On Pepcid 40 mg QD On Omeprazole PRN

## 2023-06-30 NOTE — Assessment & Plan Note (Signed)
Well-controlled On Amlodipine 5 mg QD, her leg swelling is unlikely to be due to amlodipine Advised to follow DASH diet and perform moderate exercise as tolerated

## 2023-07-19 ENCOUNTER — Ambulatory Visit (HOSPITAL_COMMUNITY)
Admission: RE | Admit: 2023-07-19 | Discharge: 2023-07-19 | Disposition: A | Discharge status: Home / Self Care | Payer: BC Managed Care – PPO | Source: Ambulatory Visit | Attending: Adult Health | Admitting: Adult Health

## 2023-07-19 DIAGNOSIS — Z1231 Encounter for screening mammogram for malignant neoplasm of breast: Secondary | ICD-10-CM | POA: Insufficient documentation

## 2023-07-27 ENCOUNTER — Ambulatory Visit: Payer: BC Managed Care – PPO | Admitting: Family Medicine

## 2023-08-03 ENCOUNTER — Encounter: Payer: Self-pay | Admitting: Internal Medicine

## 2023-08-03 ENCOUNTER — Ambulatory Visit: Payer: BC Managed Care – PPO | Admitting: Internal Medicine

## 2023-08-03 VITALS — BP 134/84 | HR 85 | Ht 64.0 in | Wt 237.6 lb

## 2023-08-03 DIAGNOSIS — M542 Cervicalgia: Secondary | ICD-10-CM | POA: Diagnosis not present

## 2023-08-03 DIAGNOSIS — Z2821 Immunization not carried out because of patient refusal: Secondary | ICD-10-CM

## 2023-08-03 DIAGNOSIS — M25512 Pain in left shoulder: Secondary | ICD-10-CM

## 2023-08-03 DIAGNOSIS — E042 Nontoxic multinodular goiter: Secondary | ICD-10-CM | POA: Diagnosis not present

## 2023-08-03 MED ORDER — CELECOXIB 200 MG PO CAPS: 200.0000 mg | ORAL_CAPSULE | Freq: Two times a day (BID) | ORAL | 1 refills | Status: DC | PRN

## 2023-08-03 NOTE — Patient Instructions (Signed)
Please start taking Celebrex as needed for shoulder pain.  Apply ice over the shoulder area for local inflammation.  You are being referred to Orthopedic surgeon.

## 2023-08-03 NOTE — Progress Notes (Signed)
Acute Office Visit  Subjective:    Patient ID: Gina Coleman, female    DOB: 1976/10/18, 47 y.o.   MRN: [id number]  Chief Complaint  Patient presents with   Shoulder Pain    Patient is having shoulder pain , along with neck pain and a headache     HPI Patient is in today for complaint of left shoulder pain, which is constant, dull, worse with movement and radiates towards the head and neck area (?). ROM is limited due to pain at left shoulder. She has tried ibuprofen with mild relief.  Denies any numbness or tingling of the UE.  Denies any recent injury.  She also reports pain over left side of the neck, which is unrelated to the shoulder pain at times.  She has also noticed difficulty swallowing at times.  She has history of multinodular goiter, and her last [place] of thyroid in 02/24 did not show any PE significant increase in size of thyroid nodules or suspicious character.  Past Medical History:  Diagnosis Date   Acid reflux    Anxiety    Headache    Hypertension    Hyperthyroidism     Past Surgical History:  Procedure Laterality Date   BIOPSY  06/05/2021   Procedure: BIOPSY;  Surgeon: Malissa Hippo, MD;  Location: [facility];  Service: Endoscopy;;   COLONOSCOPY N/A 06/05/2021   rehman:The examined portion of the ileum was normal. one 4mm polyp in cecum (tubular adenoma). external hemorrhoids   ESOPHAGOGASTRODUODENOSCOPY N/A 06/05/2021   Rehman:Normal hypopharynx.normal esophagus, z line irregular 36 cm from incisors, gastritics with reactive gastropathy, normal duodenal bulb and second portion of duodenum   FOOT SURGERY Right    LEG SURGERY Right    TRANSFORAMINAL LUMBAR INTERBODY FUSION W/ MIS 1 LEVEL N/A 12/08/2021   Procedure: Lumbar Three-Four Minimally Invasive Transforaminal Lumbar Interbody Fusion;  Surgeon: Jadene Pierini, MD;  Location: [facility];  Service: Neurosurgery;  Laterality: N/A;  Lumbar Three-Four Minimally Invasive Transforaminal Lumbar Interbody  Fusion   TUBAL LIGATION      Family History  Problem Relation Age of Onset   CAD Mother        Premature disease   Hypertension Sister    Breast cancer Maternal Grandmother    HIV Sister    Asthma Son     Social History   Socioeconomic History   Marital status: Married    Spouse name: Not on file   Number of children: Not on file   Years of education: Not on file   Highest education level: Not on file  Occupational History   Not on file  Tobacco Use   Smoking status: Some Days    Types: Cigarettes   Smokeless tobacco: Never  Vaping Use   Vaping status: Never Used  Substance and Sexual Activity   Alcohol use: Yes    Alcohol/week: 3.0 standard drinks of alcohol    Types: 3 Glasses of wine per week    Comment: social    Drug use: Never   Sexual activity: Yes    Birth control/protection: Surgical    Comment: tubal  Other Topics Concern   Not on file  Social History Narrative   Not on file   Social Determinants of Health   Financial Resource Strain: Low Risk  (09/15/2022)   Overall Financial Resource Strain (CARDIA)    Difficulty of Paying Living Expenses: Not hard at all  Food Insecurity: No Food Insecurity (09/15/2022)   Hunger Vital  Sign    Worried About Programme researcher, broadcasting/film/video in the Last Year: Never true    Ran Out of Food in the Last Year: Never true  Transportation Needs: No Transportation Needs (09/15/2022)   PRAPARE - Administrator, Civil Service (Medical): No    Lack of Transportation (Non-Medical): No  Physical Activity: Insufficiently Active (09/15/2022)   Exercise Vital Sign    Days of Exercise per Week: 2 days    Minutes of Exercise per Session: 10 min  Stress: No Stress Concern Present (09/15/2022)   [employer] of Occupational Health - Occupational Stress Questionnaire    Feeling of Stress : Not at all  Social Connections: Moderately Integrated (09/15/2022)   Social Connection and Isolation Panel [NHANES]    Frequency of  Communication with Friends and Family: More than three times a week    Frequency of Social Gatherings with Friends and Family: More than three times a week    Attends Religious Services: More than 4 times per year    Active Member of [employer] or Organizations: No    Attends Banker Meetings: Never    Marital Status: Married  Catering manager Violence: Not At Risk (09/15/2022)   Humiliation, Afraid, Rape, and Kick questionnaire    Fear of Current or Ex-Partner: No    Emotionally Abused: No    Physically Abused: No    Sexually Abused: No    Outpatient Medications Prior to Visit  Medication Sig Dispense Refill   amLODipine (NORVASC) 5 MG tablet Take 1 tablet (5 mg total) by mouth daily. 90 tablet 1   famotidine (PEPCID) 40 MG tablet Take 1 tablet (40 mg total) by mouth daily. In the evening 90 tablet 3   methimazole (TAPAZOLE) 5 MG tablet Take 1 tablet (5 mg total) by mouth daily. 90 tablet 2   Omega-3 Fatty Acids (FISH OIL) 1200 MG CAPS Take 1,200 mg by mouth daily.     triamcinolone cream (KENALOG) 0.1 % Apply 1 application topically daily as needed (Eczema).     UNABLE TO FIND Black seed oil-daily     ibuprofen (ADVIL) 800 MG tablet Take 1 tablet (800 mg total) by mouth every 8 (eight) hours as needed. (Patient taking differently: Take 800 mg by mouth every 8 (eight) hours as needed for mild pain.) 60 tablet 1   No facility-administered medications prior to visit.    Allergies  Allergen Reactions   Other Rash     GLOVE POWDER     Flexeril [Cyclobenzaprine] Palpitations    Happened with 5 mg dose    Review of Systems  Constitutional:  Negative for chills and fever.  HENT:  Negative for congestion, sinus pressure, sinus pain and sore throat.   Eyes:  Negative for pain and discharge.  Respiratory:  Negative for cough and shortness of breath.   Cardiovascular:  Negative for chest pain and palpitations.  Gastrointestinal:  Negative for diarrhea, nausea and vomiting.   Endocrine: Negative for polydipsia and polyuria.  Genitourinary:  Negative for dysuria and hematuria.  Musculoskeletal:  Positive for arthralgias (L shoulder) and neck pain. Negative for neck stiffness.  Skin:  Negative for rash.  Neurological:  Positive for headaches. Negative for dizziness and weakness.  Psychiatric/Behavioral:  Negative for agitation and behavioral problems.        Objective:    Physical Exam Vitals reviewed.  Constitutional:      General: She is not in acute distress.    Appearance: She is  obese. She is not diaphoretic.  HENT:     Head: Normocephalic and atraumatic.     Nose: Nose normal. No congestion.     Mouth/Throat:     Mouth: Mucous membranes are moist.     Pharynx: No posterior oropharyngeal erythema.  Eyes:     General: No scleral icterus.    Extraocular Movements: Extraocular movements intact.  Neck:     Thyroid: Thyromegaly present. No thyroid tenderness.     Comments: Tenderness in the left side of neck Cardiovascular:     Rate and Rhythm: Normal rate and regular rhythm.     Heart sounds: Normal heart sounds. No murmur heard. Pulmonary:     Breath sounds: Normal breath sounds. No wheezing or rales.  Musculoskeletal:     Left shoulder: Tenderness present. No swelling. Decreased range of motion.     Cervical back: Normal range of motion. No rigidity.     Right lower leg: No edema.     Left lower leg: No edema.  Skin:    General: Skin is warm.     Findings: No rash.  Neurological:     General: No focal deficit present.     Mental Status: She is alert and oriented to person, place, and time.     Sensory: No sensory deficit.     Motor: No weakness.  Psychiatric:        Mood and Affect: Mood normal.        Behavior: Behavior normal.     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 Improved with oral prednisone in the past, though would avoid repeated use of oral steroid Started Celebrex 200 mg as needed for pain Referred to orthopedic surgery      Relevant Medications   celecoxib (CELEBREX) 200 MG capsule   Other Relevant Orders   Ambulatory referral to Orthopedic Surgery   Neck pain    Left sided neck pain and soft tissue swelling Does not appear to be in conjunction with thyromegaly Check [place] of neck      Relevant Orders   US Soft Tissue Head/Neck (NON-THYROID)     Meds ordered this encounter  Medications   celecoxib (CELEBREX) 200 MG capsule    Sig: Take 1 capsule (200 mg total) by mouth 2 (two) times daily as needed.    Dispense:  30 capsule    Refill:  1     Peni Rupard Concha Se, MD

## 2023-08-06 ENCOUNTER — Encounter: Payer: Self-pay | Admitting: Internal Medicine

## 2023-08-06 DIAGNOSIS — M542 Cervicalgia: Secondary | ICD-10-CM | POA: Insufficient documentation

## 2023-08-06 NOTE — Assessment & Plan Note (Signed)
On Methimazole 5 mg BID Follows Endocrinology Last US thyroid (02/24) reviewed Last TSH and T4 wnl

## 2023-08-06 NOTE — Assessment & Plan Note (Signed)
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

## 2023-08-06 NOTE — Assessment & Plan Note (Signed)
Left sided neck pain and soft tissue swelling Does not appear to be in conjunction with thyromegaly Check [place] of neck

## 2023-08-09 ENCOUNTER — Ambulatory Visit (HOSPITAL_COMMUNITY)
Admission: RE | Admit: 2023-08-09 | Discharge: 2023-08-09 | Disposition: A | Discharge status: Home / Self Care | Payer: BC Managed Care – PPO | Source: Ambulatory Visit | Attending: Internal Medicine | Admitting: Internal Medicine

## 2023-08-09 DIAGNOSIS — M542 Cervicalgia: Secondary | ICD-10-CM | POA: Insufficient documentation

## 2023-08-10 ENCOUNTER — Ambulatory Visit: Payer: BC Managed Care – PPO | Admitting: Orthopedic Surgery

## 2023-08-12 ENCOUNTER — Telehealth: Payer: Self-pay

## 2023-08-13 NOTE — Telephone Encounter (Signed)
Scheduled appt.

## 2023-08-30 ENCOUNTER — Other Ambulatory Visit: Payer: Self-pay | Admitting: Internal Medicine

## 2023-08-30 DIAGNOSIS — M25512 Pain in left shoulder: Secondary | ICD-10-CM

## 2023-09-09 ENCOUNTER — Ambulatory Visit: Payer: BC Managed Care – PPO | Admitting: Internal Medicine

## 2023-09-20 ENCOUNTER — Ambulatory Visit: Payer: BC Managed Care – PPO | Admitting: Adult Health

## 2023-09-20 ENCOUNTER — Encounter: Payer: Self-pay | Admitting: Adult Health

## 2023-09-20 VITALS — BP 145/87 | HR 79 | Ht 64.0 in | Wt 241.5 lb

## 2023-09-20 DIAGNOSIS — Z01419 Encounter for gynecological examination (general) (routine) without abnormal findings: Secondary | ICD-10-CM | POA: Diagnosis not present

## 2023-09-20 DIAGNOSIS — I10 Essential (primary) hypertension: Secondary | ICD-10-CM | POA: Diagnosis not present

## 2023-09-20 DIAGNOSIS — Z1331 Encounter for screening for depression: Secondary | ICD-10-CM

## 2023-09-20 DIAGNOSIS — Z1211 Encounter for screening for malignant neoplasm of colon: Secondary | ICD-10-CM | POA: Insufficient documentation

## 2023-09-20 DIAGNOSIS — D219 Benign neoplasm of connective and other soft tissue, unspecified: Secondary | ICD-10-CM

## 2023-09-20 DIAGNOSIS — R140 Abdominal distension (gaseous): Secondary | ICD-10-CM | POA: Diagnosis not present

## 2023-09-20 LAB — HEMOCCULT GUIAC POC 1CARD (OFFICE): Fecal Occult Blood, POC: NEGATIVE

## 2023-09-20 NOTE — Progress Notes (Signed)
Patient ID: Gina Coleman, female   DOB: 07/19/1976, 47 y.o.   MRN: [id number] History of Present Illness: Gina Coleman is a 47 year old black female, married, [id number] in for a well woman gyn exam and feels bloated.     Component Value Date/Time   DIAGPAP  06/22/2023 1548    - Negative for intraepithelial lesion or malignancy (NILM)   DIAGPAP  06/14/2020 1537    - Negative for intraepithelial lesion or malignancy (NILM)   HPVHIGH Negative 06/22/2023 1548   HPVHIGH Negative 06/14/2020 1537   ADEQPAP  06/22/2023 1548    Satisfactory for evaluation; transformation zone component PRESENT.   ADEQPAP  06/14/2020 1537    Satisfactory for evaluation; transformation zone component PRESENT.    PCP is Dr Allena Katz.   Current Medications, Allergies, Past Medical History, Past Surgical History, Family History and Social History were reviewed in [employer] record.     Review of Systems: Patient denies any headaches, hearing loss, fatigue, blurred vision, shortness of breath, chest pain, abdominal pain, problems with bowel movements, urination, or intercourse. No joint pain or mood swings.  Feels bloated    Physical Exam:BP (!) 145/87 (BP Location: Right Arm, Patient Position: Sitting, Cuff Size: Large)   Pulse 79   Ht 5\' 4"  (1.626 m)   Wt 241 lb 8 oz (109.5 kg)   LMP 09/03/2023   BMI 41.45 kg/m   General:  Well developed, well nourished, no acute distress Skin:  Warm and dry Neck:  Midline trachea, normal thyroid, good ROM, no lymphadenopathy Lungs; Clear to auscultation bilaterally Breast:  No dominant palpable mass, retraction, or nipple discharge Cardiovascular: Regular rate and rhythm Abdomen:  Soft, non tender, no hepatosplenomegaly Pelvic:  External genitalia is normal in appearance, no lesions.  The vagina is normal in appearance. Urethra has no lesions or masses. The cervix is bulbous.  Uterus is felt to be normal size, shape, and contour.  No adnexal masses or  tenderness noted.Bladder is non tender, no masses felt. Rectal: Good sphincter tone, no polyps, or hemorrhoids felt.  Hemoccult negative. Extremities/musculoskeletal:  No swelling or varicosities noted, no clubbing or cyanosis Psych:  No mood changes, alert and cooperative,seems happy AA is 3 Fall risk is low    09/20/2023   10:46 AM 08/03/2023   10:27 AM 06/30/2023    1:01 PM  Depression screen PHQ 2/9  Decreased Interest 0 0 0  Down, Depressed, Hopeless 0 0 0  PHQ - 2 Score 0 0 0  Altered sleeping 3    Tired, decreased energy 2    Change in appetite 0    Feeling bad or failure about yourself  0    Trouble concentrating 0    Moving slowly or fidgety/restless 0    Suicidal thoughts 0    PHQ-9 Score 5         09/20/2023   10:46 AM 04/09/2023    8:16 AM 09/15/2022    8:50 AM 09/13/2020   12:31 PM  GAD 7 : Generalized Anxiety Score  Nervous, Anxious, on Edge 0 0 0 0  Control/stop worrying 0 0 0 0  Worry too much - different things 0 0 0 1  Trouble relaxing 0 0 0 0  Restless 0 0 0 0  Easily annoyed or irritable 2 0 2 1  Afraid - awful might happen 0 0 0 0  Total GAD 7 Score 2 0 2 2    Upstream - 09/20/23 1041  Pregnancy Intention Screening   Does the patient want to become pregnant in the next year? No    Does the patient's partner want to become pregnant in the next year? No    Would the patient like to discuss contraceptive options today? No      Contraception Wrap Up   Current Method Female Sterilization    End Method Female Sterilization    Contraception Counseling Provided No              Examination chaperoned by Malachy Mood LPN   Impression and Plan: 1. Encounter for well woman exam with routine gynecological exam Physical in 1 year Pap in 2027 Mammogram was negative 07/19/23 Colonoscopy per [facility] with PCP  2. Encounter for screening fecal occult blood testing Hemoccult was negative  - POCT occult blood stool  3. Hypertension, unspecified  type Take BP meds, did not take today Follow up with PCP   4. Bloated abdomen  5. Fibroid Has fibroid seen 06/30/23 on pelvic US

## 2023-09-21 ENCOUNTER — Encounter: Payer: Self-pay | Admitting: Internal Medicine

## 2023-09-21 ENCOUNTER — Ambulatory Visit: Payer: BC Managed Care – PPO | Admitting: Internal Medicine

## 2023-09-21 VITALS — BP 135/85 | HR 85 | Ht 64.0 in | Wt 240.4 lb

## 2023-09-21 DIAGNOSIS — M25512 Pain in left shoulder: Secondary | ICD-10-CM

## 2023-09-21 DIAGNOSIS — E042 Nontoxic multinodular goiter: Secondary | ICD-10-CM

## 2023-09-21 DIAGNOSIS — R7303 Prediabetes: Secondary | ICD-10-CM | POA: Diagnosis not present

## 2023-09-21 DIAGNOSIS — E559 Vitamin D deficiency, unspecified: Secondary | ICD-10-CM

## 2023-09-21 DIAGNOSIS — K219 Gastro-esophageal reflux disease without esophagitis: Secondary | ICD-10-CM

## 2023-09-21 DIAGNOSIS — E782 Mixed hyperlipidemia: Secondary | ICD-10-CM | POA: Diagnosis not present

## 2023-09-21 DIAGNOSIS — I10 Essential (primary) hypertension: Secondary | ICD-10-CM | POA: Diagnosis not present

## 2023-09-21 NOTE — Progress Notes (Unsigned)
Established Patient Office Visit  Subjective:  Patient ID: Gina Coleman, female    DOB: Mar 27, 1976  Age: 47 y.o. MRN: [id number]  CC:  Chief Complaint  Patient presents with   Hypertension    Follow up     HPI Gina Coleman is a 47 y.o. female with past medical history of hypertension and subclinical hyperthyroidism who presents for f/u of her chronic medical conditions.  HTN: BP is well-controlled. Takes medications regularly. Patient denies headache, dizziness, chest pain, dyspnea or palpitations.  Hyperthyroidism: On Methimazole.  Followed by endocrinology.   She has been taking Pepcid PRN for GERD.  Her acid reflux symptoms are better now.  Denies any dysphagia or odynophagia.  She had had an episode of left shoulder pain, radiating towards left hand in the last week.  She went to urgent care and was given oral prednisone.  She has noticed significant improvement in her shoulder pain since then.  Denies any numbness or tingling of the hand today.  Past Medical History:  Diagnosis Date   Acid reflux    Anxiety    Headache    Hypertension    Hyperthyroidism     Past Surgical History:  Procedure Laterality Date   BIOPSY  06/05/2021   Procedure: BIOPSY;  Surgeon: Malissa Hippo, MD;  [facility];  Service: Endoscopy;;   COLONOSCOPY N/A 06/05/2021   rehman:The examined portion of the ileum was normal. one 4mm polyp in cecum (tubular adenoma). external hemorrhoids   ESOPHAGOGASTRODUODENOSCOPY N/A 06/05/2021   Rehman:Normal hypopharynx.normal esophagus, z line irregular 36 cm from incisors, gastritics with reactive gastropathy, normal duodenal bulb and second portion of duodenum   FOOT SURGERY Right    LEG SURGERY Right    TRANSFORAMINAL LUMBAR INTERBODY FUSION W/ MIS 1 LEVEL N/A 12/08/2021   Procedure: Lumbar Three-Four Minimally Invasive Transforaminal Lumbar Interbody Fusion;  Surgeon: Jadene Pierini, MD;  Location: [facility];  Service: Neurosurgery;   Laterality: N/A;  Lumbar Three-Four Minimally Invasive Transforaminal Lumbar Interbody Fusion   TUBAL LIGATION      Family History  Problem Relation Age of Onset   CAD Mother        Premature disease   Hypertension Sister    Breast cancer Maternal Grandmother    HIV Sister    Asthma Son     Social History   Socioeconomic History   Marital status: Married    Spouse name: Not on file   Number of children: Not on file   Years of education: Not on file   Highest education level: Not on file  Occupational History   Not on file  Tobacco Use   Smoking status: Former    Types: Cigarettes   Smokeless tobacco: Never  Vaping Use   Vaping status: Never Used  Substance and Sexual Activity   Alcohol use: Yes    Alcohol/week: 3.0 standard drinks of alcohol    Types: 3 Glasses of wine per week    Comment: social    Drug use: Never   Sexual activity: Yes    Birth control/protection: Surgical    Comment: tubal  Other Topics Concern   Not on file  Social History Narrative   Not on file   Social Determinants of Health   Financial Resource Strain: Low Risk  (09/20/2023)   Overall Financial Resource Strain (CARDIA)    Difficulty of Paying Living Expenses: Not hard at all  Food Insecurity: No Food Insecurity (09/20/2023)   Hunger Vital Sign  Worried About Programme researcher, broadcasting/film/video in the Last Year: Never true    Ran Out of Food in the Last Year: Never true  Transportation Needs: No Transportation Needs (09/20/2023)   PRAPARE - Administrator, Civil Service (Medical): No    Lack of Transportation (Non-Medical): No  Physical Activity: Inactive (09/20/2023)   Exercise Vital Sign    Days of Exercise per Week: 0 days    Minutes of Exercise per Session: 10 min  Stress: No Stress Concern Present (09/20/2023)   [employer] of Occupational Health - Occupational Stress Questionnaire    Feeling of Stress : Only a little  Social Connections: Socially Integrated (09/20/2023)    Social Connection and Isolation Panel [NHANES]    Frequency of Communication with Friends and Family: More than three times a week    Frequency of Social Gatherings with Friends and Family: Once a week    Attends Religious Services: More than 4 times per year    Active Member of [employer] or Organizations: Yes    Attends Banker Meetings: Never    Marital Status: Married  Catering manager Violence: Not At Risk (09/20/2023)   Humiliation, Afraid, Rape, and Kick questionnaire    Fear of Current or Ex-Partner: No    Emotionally Abused: No    Physically Abused: No    Sexually Abused: No    Outpatient Medications Prior to Visit  Medication Sig Dispense Refill   amLODipine (NORVASC) 5 MG tablet Take 1 tablet (5 mg total) by mouth daily. 90 tablet 1   celecoxib (CELEBREX) 200 MG capsule TAKE 1 CAPSULE(200 MG) BY MOUTH TWICE DAILY AS NEEDED 30 capsule 1   famotidine (PEPCID) 40 MG tablet Take 1 tablet (40 mg total) by mouth daily. In the evening 90 tablet 3   methimazole (TAPAZOLE) 5 MG tablet Take 1 tablet (5 mg total) by mouth daily. 90 tablet 2   Omega-3 Fatty Acids (FISH OIL) 1200 MG CAPS Take 1,200 mg by mouth daily.     triamcinolone cream (KENALOG) 0.1 % Apply 1 application topically daily as needed (Eczema).     UNABLE TO FIND Black seed oil-daily     No facility-administered medications prior to visit.    Allergies  Allergen Reactions   Other Rash     GLOVE POWDER     Flexeril [Cyclobenzaprine] Palpitations    Happened with 5 mg dose    ROS Review of Systems  Constitutional:  Negative for chills and fever.  HENT:  Negative for congestion, sinus pressure, sinus pain and sore throat.   Eyes:  Negative for pain and discharge.  Respiratory:  Negative for cough and shortness of breath.   Cardiovascular:  Negative for chest pain and palpitations.  Gastrointestinal:  Negative for diarrhea, nausea and vomiting.  Endocrine: Negative for polydipsia and polyuria.   Genitourinary:  Negative for dysuria and hematuria.  Musculoskeletal:  Negative for neck pain and neck stiffness.  Skin:  Negative for rash.  Neurological:  Negative for dizziness and weakness.  Psychiatric/Behavioral:  Negative for agitation and behavioral problems.       Objective:    Physical Exam Vitals reviewed.  Constitutional:      General: She is not in acute distress.    Appearance: She is obese. She is not diaphoretic.  HENT:     Head: Normocephalic and atraumatic.     Nose: Nose normal. No congestion.     Mouth/Throat:     Mouth: Mucous membranes are  moist.     Pharynx: No posterior oropharyngeal erythema.  Eyes:     General: No scleral icterus.    Extraocular Movements: Extraocular movements intact.  Neck:     Thyroid: Thyromegaly present. No thyroid tenderness.  Cardiovascular:     Rate and Rhythm: Normal rate and regular rhythm.     Heart sounds: Normal heart sounds. No murmur heard. Pulmonary:     Breath sounds: Normal breath sounds. No wheezing or rales.  Musculoskeletal:     Cervical back: Normal range of motion. No rigidity.     Right lower leg: No edema.     Left lower leg: No edema.  Skin:    General: Skin is warm.     Findings: No rash.  Neurological:     General: No focal deficit present.     Mental Status: She is alert and oriented to person, place, and time.     Sensory: No sensory deficit.     Motor: No weakness.  Psychiatric:        Mood and Affect: Mood normal.        Behavior: Behavior normal.     BP 135/85 (BP Location: Left Arm, Patient Position: Sitting, Cuff Size: Large)   Pulse 85   Ht 5\' 4"  (1.626 m)   Wt 240 lb 6.4 oz (109 kg)   LMP 09/03/2023   SpO2 97%   BMI 41.26 kg/m  Wt Readings from Last 3 Encounters:  09/21/23 240 lb 6.4 oz (109 kg)  09/20/23 241 lb 8 oz (109.5 kg)  08/03/23 237 lb 9.6 oz (107.8 kg)    Lab Results  Component Value Date   TSH 1.26 12/21/2022   Lab Results  Component Value Date   WBC 5.0  10/26/2022   HGB 12.4 10/26/2022   HCT 38.9 10/26/2022   MCV 88.8 10/26/2022   PLT 240 10/26/2022   Lab Results  Component Value Date   NA 138 10/26/2022   K 4.1 10/26/2022   CO2 26 10/26/2022   GLUCOSE 102 (H) 10/26/2022   BUN 11 10/26/2022   CREATININE 0.67 10/26/2022   BILITOT 0.5 09/28/2022   ALKPHOS 124 (H) 09/28/2022   AST 12 09/28/2022   ALT 11 09/28/2022   PROT 7.2 09/28/2022   ALBUMIN 4.1 09/28/2022   CALCIUM 8.8 (L) 10/26/2022   ANIONGAP 7 10/26/2022   EGFR 90 09/28/2022   GFR 69.56 05/13/2020   Lab Results  Component Value Date   CHOL 162 09/28/2022   Lab Results  Component Value Date   HDL 53 09/28/2022   Lab Results  Component Value Date   LDLCALC 98 09/28/2022   Lab Results  Component Value Date   TRIG 55 09/28/2022   Lab Results  Component Value Date   CHOLHDL 3.1 09/28/2022   Lab Results  Component Value Date   HGBA1C 5.8 (H) 09/28/2022      Assessment & Plan:   Problem List Items Addressed This Visit    Problem List Items Addressed This Visit   None      No orders of the defined types were placed in this encounter.   Follow-up: No follow-ups on file.    Anabel Halon, MD

## 2023-09-21 NOTE — Patient Instructions (Addendum)
Please continue to take medications as prescribed.  Please continue to follow low salt diet and perform moderate exercise/walking at least 150 mins/week. 

## 2023-09-21 NOTE — Assessment & Plan Note (Signed)
On Methimazole 5 mg BID Follows Endocrinology Last US thyroid (02/24) reviewed Last TSH and T4 wnl

## 2023-09-21 NOTE — Assessment & Plan Note (Signed)
Well-controlled On Amlodipine 5 mg QD, her leg swelling is unlikely to be due to amlodipine Advised to follow DASH diet and perform moderate exercise as tolerated

## 2023-09-21 NOTE — Assessment & Plan Note (Signed)
Well-controlled overall, but has occasional heartburn On Pepcid 40 mg QD On Omeprazole PRN

## 2023-09-22 DIAGNOSIS — R7303 Prediabetes: Secondary | ICD-10-CM | POA: Insufficient documentation

## 2023-09-22 DIAGNOSIS — E782 Mixed hyperlipidemia: Secondary | ICD-10-CM | POA: Insufficient documentation

## 2023-09-22 LAB — CMP14+EGFR
ALT: 15 [IU]/L (ref 0–32)
AST: 17 [IU]/L (ref 0–40)
Albumin: 4 g/dL (ref 3.9–4.9)
Alkaline Phosphatase: 138 [IU]/L — ABNORMAL HIGH (ref 44–121)
BUN/Creatinine Ratio: 17 (ref 9–23)
BUN: 12 mg/dL (ref 6–24)
Bilirubin Total: 0.3 mg/dL (ref 0.0–1.2)
CO2: 24 mmol/L (ref 20–29)
Calcium: 9.1 mg/dL (ref 8.7–10.2)
Chloride: 103 mmol/L (ref 96–106)
Creatinine, Ser: 0.72 mg/dL (ref 0.57–1.00)
Globulin, Total: 3.1 g/dL (ref 1.5–4.5)
Glucose: 106 mg/dL — ABNORMAL HIGH (ref 70–99)
Potassium: 4.1 mmol/L (ref 3.5–5.2)
Sodium: 139 mmol/L (ref 134–144)
Total Protein: 7.1 g/dL (ref 6.0–8.5)
eGFR: 103 mL/min/{1.73_m2} (ref >59)

## 2023-09-22 LAB — CBC WITH DIFFERENTIAL/PLATELET
Basophils Absolute: 0 10*3/uL (ref 0.0–0.2)
Basos: 0 %
EOS (ABSOLUTE): 0.3 10*3/uL (ref 0.0–0.4)
Eos: 5 %
Hematocrit: 38.4 % (ref 34.0–46.6)
Hemoglobin: 12.4 g/dL (ref 11.1–15.9)
Immature Grans (Abs): 0 10*3/uL (ref 0.0–0.1)
Immature Granulocytes: 0 %
Lymphocytes Absolute: 1.9 10*3/uL (ref 0.7–3.1)
Lymphs: 28 %
MCH: 29.3 pg (ref 26.6–33.0)
MCHC: 32.3 g/dL (ref 31.5–35.7)
MCV: 91 fL (ref 79–97)
Monocytes Absolute: 0.7 10*3/uL (ref 0.1–0.9)
Monocytes: 11 %
Neutrophils Absolute: 3.7 10*3/uL (ref 1.4–7.0)
Neutrophils: 56 %
Platelets: 227 10*3/uL (ref 150–450)
RBC: 4.23 x10E6/uL (ref 3.77–5.28)
RDW: 12.2 % (ref 11.7–15.4)
WBC: 6.7 10*3/uL (ref 3.4–10.8)

## 2023-09-22 LAB — LIPID PANEL
Chol/HDL Ratio: 3.2 ratio (ref 0.0–4.4)
Cholesterol, Total: 185 mg/dL (ref 100–199)
HDL: 57 mg/dL (ref >39)
LDL Chol Calc (NIH): 108 mg/dL — ABNORMAL HIGH (ref 0–99)
Triglycerides: 114 mg/dL (ref 0–149)
VLDL Cholesterol Cal: 20 mg/dL (ref 5–40)

## 2023-09-22 LAB — TSH+FREE T4
Free T4: 0.98 ng/dL (ref 0.82–1.77)
TSH: 1.46 u[IU]/mL (ref 0.450–4.500)

## 2023-09-22 LAB — VITAMIN D 25 HYDROXY (VIT D DEFICIENCY, FRACTURES): Vit D, 25-Hydroxy: 35.3 ng/mL (ref 30.0–100.0)

## 2023-09-22 LAB — HEMOGLOBIN A1C
Est. average glucose Bld gHb Est-mCnc: 123 mg/dL
Hgb A1c MFr Bld: 5.9 % — ABNORMAL HIGH (ref 4.8–5.6)

## 2023-09-22 NOTE — Assessment & Plan Note (Signed)
Lab Results  Component Value Date   HGBA1C 5.9 (H) 09/21/2023   Advised to follow DASH diet

## 2023-09-22 NOTE — Assessment & Plan Note (Signed)
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

## 2023-09-22 NOTE — Assessment & Plan Note (Signed)
Check lipid profile Advised to follow DASH diet 

## 2023-09-30 ENCOUNTER — Other Ambulatory Visit: Payer: Self-pay | Admitting: Internal Medicine

## 2023-09-30 DIAGNOSIS — K219 Gastro-esophageal reflux disease without esophagitis: Secondary | ICD-10-CM

## 2023-10-07 ENCOUNTER — Telehealth: Payer: Self-pay | Admitting: Internal Medicine

## 2023-10-07 NOTE — Telephone Encounter (Signed)
Health exam (work)  Noted  Copied Armed forces logistics/support/administrative officer in PCP box Copy front desk folder

## 2023-10-08 NOTE — Telephone Encounter (Signed)
Pt forms completed   Pt advised to come in for  TB   Pt will come in Monday morning.

## 2023-10-11 ENCOUNTER — Ambulatory Visit (INDEPENDENT_AMBULATORY_CARE_PROVIDER_SITE_OTHER): Payer: BC Managed Care – PPO

## 2023-10-11 DIAGNOSIS — Z111 Encounter for screening for respiratory tuberculosis: Secondary | ICD-10-CM

## 2023-10-11 NOTE — Telephone Encounter (Signed)
Patient picked up forms.

## 2023-10-13 LAB — TB SKIN TEST
Induration: 0.01 mm
TB Skin Test: NEGATIVE

## 2023-10-30 ENCOUNTER — Other Ambulatory Visit: Payer: Self-pay | Admitting: Internal Medicine

## 2023-10-30 DIAGNOSIS — M25512 Pain in left shoulder: Secondary | ICD-10-CM

## 2023-12-22 ENCOUNTER — Ambulatory Visit: Payer: BC Managed Care – PPO | Admitting: Internal Medicine

## 2023-12-22 NOTE — Progress Notes (Deleted)
Name: Gina Coleman  MRN/ DOB: [id number], July 12, 1976    Age/ Sex: 48 y.o., female     PCP: Anabel Halon, MD   Reason for Endocrinology Evaluation: MNG     Initial Endocrinology Clinic Visit: 07/12/2019    PATIENT IDENTIFIER: Gina Coleman is a 48 y.o., female with a past medical history of HTN. She has followed with [facility] since 07/12/2019 for consultative assistance with management of her 07/12/2019.   HISTORICAL SUMMARY: The patient was first  noted to have a low TSH at 0.399 uIU/mL during an evaluation for an enlarged cervical lymph node in 05/2019. She was started on Abx at the time.  Repeat TFT's were normal in 07/2019 . Ultrasound showed MNG . She is S/P benign FNA of the RUP nodule 08/2019   TRAb was undetectable  Methimazole started 03/2020 due to symptoms of hyperthyroidism    No FH of thyroid disorder  SUBJECTIVE:    Today (12/22/2023):  Gina Coleman is here for a follow up on subclinical hyperthyroidism.    Weight continues to fluctuate  Denies local neck swelling  Denies palpitations  Has occasional tremors  No loose stool or diarrhea    Methimazole 5 mg, once daily     HISTORY:  Past Medical History:  Past Medical History:  Diagnosis Date   Acid reflux    Anxiety    Headache    Hypertension    Hyperthyroidism    Past Surgical History:  Past Surgical History:  Procedure Laterality Date   BIOPSY  06/05/2021   Procedure: BIOPSY;  Surgeon: Malissa Hippo, MD;  [facility];  Service: Endoscopy;;   COLONOSCOPY N/A 06/05/2021   rehman:The examined portion of the ileum was normal. one 4mm polyp in cecum (tubular adenoma). external hemorrhoids   ESOPHAGOGASTRODUODENOSCOPY N/A 06/05/2021   Rehman:Normal hypopharynx.normal esophagus, z line irregular 36 cm from incisors, gastritics with reactive gastropathy, normal duodenal bulb and second portion of duodenum   FOOT SURGERY Right    LEG SURGERY Right     TRANSFORAMINAL LUMBAR INTERBODY FUSION W/ MIS 1 LEVEL N/A 12/08/2021   Procedure: Lumbar Three-Four Minimally Invasive Transforaminal Lumbar Interbody Fusion;  Surgeon: Jadene Pierini, MD;  Location: [facility];  Service: Neurosurgery;  Laterality: N/A;  Lumbar Three-Four Minimally Invasive Transforaminal Lumbar Interbody Fusion   TUBAL LIGATION     Social History:  reports that she has quit smoking. Her smoking use included cigarettes. She has never used smokeless tobacco. She reports current alcohol use of about 3.0 standard drinks of alcohol per week. She reports that she does not use drugs. Family History:  Family History  Problem Relation Age of Onset   CAD Mother        Premature disease   Hypertension Sister    Breast cancer Maternal Grandmother    HIV Sister    Asthma Son      HOME MEDICATIONS: Allergies as of 12/22/2023       Reactions   Other Rash   GLOVE POWDER    Flexeril [cyclobenzaprine] Palpitations   Happened with 5 mg dose        Medication List        Accurate as of December 22, 2023  7:25 AM. If you have any questions, ask your nurse or doctor.          amLODipine 5 MG tablet Commonly known as: NORVASC Take 1 tablet (5 mg total) by mouth daily.   celecoxib 200 MG capsule  Commonly known as: CELEBREX TAKE 1 CAPSULE(200 MG) BY MOUTH TWICE DAILY AS NEEDED   famotidine 40 MG tablet Commonly known as: PEPCID TAKE 1 TABLET(40 MG) BY MOUTH DAILY IN THE EVENING   Fish Oil 1200 MG Caps Take 1,200 mg by mouth daily.   methimazole 5 MG tablet Commonly known as: TAPAZOLE Take 1 tablet (5 mg total) by mouth daily.   triamcinolone cream 0.1 % Commonly known as: KENALOG Apply 1 application topically daily as needed (Eczema).   UNABLE TO FIND Black seed oil-daily          OBJECTIVE:   PHYSICAL EXAM: VS: There were no vitals taken for this visit.   EXAM: General: Pt appears well and is in NAD  Neck: General: Supple without  adenopathy. Thyroid: Right thyroid  nodule appreciated.   Lungs: Clear with good BS bilat with no rales, rhonchi, or wheezes  Heart: Auscultation: RRR.  Abdomen: Normoactive bowel sounds, soft, nontender, without masses or organomegaly palpable  Extremities:  BL LE: No pretibial edema normal ROM and strength.  Mental Status: Judgment, insight: Intact Orientation: Oriented to time, place, and person Mood and affect: No depression, anxiety, or agitation     DATA REVIEWED:   Latest Reference Range & Units 12/21/22 10:32  TSH 0.35 - 5.50 uIU/mL 1.26  T4,Free(Direct) 0.60 - 1.60 ng/dL 4.33     Thyroid Ultrasound 02/10/2023 Estimated total number of nodules >/= 1 cm: 6   Number of spongiform nodules >/=  2 cm not described below (TR1): 0   Number of mixed cystic and solid nodules >/= 1.5 cm not described below (TR2): 0   _________________________________________________________   Nodule 1: Previously biopsied isthmus nodule is not significantly changed in size currently measuring 1.9 x 1.1 x 1 x 1.8 cm. Please correlate with prior FNA results from 02/25/2022.   _________________________________________________________   Nodule 2: 4.4 x 1.4 x 3 x 1 cm solid hypoechoic right superior thyroid nodule is unchanged in size since prior examination. Please correlate with prior FNA results from 08/02/2019.   _________________________________________________________   Nodule 3: 1.0 x 0.9 x 0.6 cm isoechoic solid isthmus nodule (TI-RADS 3) does not meet criteria for imaging surveillance or FNA.   _________________________________________________________   Nodule 4: 1.1 x 1.0 x 1.0 cm solid isoechoic right inferior thyroid nodule (TI-RADS 3) is not significantly changed in size since 12/24/2020 and does not meet criteria for imaging surveillance or FNA.   _________________________________________________________   Nodule 5: 1.4 x 1.2 x 0.9 cm solid isoechoic right inferior  thyroid nodule (TI-RADS 3) is not significantly changed in size since 12/24/2020 and does not meet criteria for imaging surveillance or FNA.   _________________________________________________________   Nodule 6: 1.5 x 1.0 x 1.5 cm solid isoechoic left superior thyroid nodule (TI-RADS 3) is not significantly changed in size since 12/24/2020 and does not meet criteria for imaging surveillance or FNA.   IMPRESSION: 1. Previously biopsied isthmus and right thyroid nodules are not significantly changed in size. Please correlate with prior FNA results from 02/25/2022 and 08/02/2019. 2. Remaining bilateral thyroid nodules are not significantly changed in size since 12/24/2020.   The above is in keeping with the ACR TI-RADS recommendations - J Am Coll Radiol 2017;14:587-595.          FNA (08/02/2019)   THYROID, FINE NEEDLE ASPIRATION, RUP (SPECIMEN 1 OF 1, COLLECTED 08/02/19): CONSISTENT WITH BENIGN FOLLICULAR NODULE (BETHESDA CATEGORY II).   FNA 02/25/2022  Clinical History: 2.6 cm Isthmus Specimen Submitted:  A.  THYROID, ISTHMUS, FINE NEEDLE ASPIRATION:   FINAL MICROSCOPIC DIAGNOSIS: - Atypia of undetermined significance (Bethesda category III)   Afirma Benign    ASSESSMENT / PLAN / RECOMMENDATIONS:   Multinodular Goiter :    -No local neck symptoms - S/P Benign FNA of the Right upper nodule 08/2019 - S/P FNA isthmic nodule with benign molecular testing 01/2022 -We will proceed with repeat thyroid ultrasound at [facility]  2. Hyperthyroidism :   - Pt is clinically euthyroid  - She was treated because she was symptomatic  - Most likely secondary to autonomous nodule  -  TFT's remain normal, but she has been noted with normal-low free T4, but I suspect this has to do with the fact that she had to double up recently because she forgot to take it 1 day.  I did discourage the patient from doubling up on methimazole and I have encouraged her to use a  pillbox   Medication: Continue  methimazole 5 mg daily      F/U in 1 year    Signed electronically by: Lyndle Herrlich, MD  [facility]  [facility] [address] [place] [place], [place] [address] Phone: [phone] FAX: [phone]      CC: Anabel Halon, MD [address] [place] [address] Phone: [phone]  Fax: [phone]   Return to Endocrinology clinic as below: Future Appointments  Date Time Provider Department Center  12/22/2023 10:30 AM Alan Drummer, Konrad Dolores, MD [facility] None  01/31/2024 10:00 AM Anabel Halon, MD [facility]

## 2023-12-27 ENCOUNTER — Encounter: Payer: Self-pay | Admitting: Internal Medicine

## 2023-12-27 ENCOUNTER — Ambulatory Visit: Payer: BC Managed Care – PPO | Admitting: Internal Medicine

## 2023-12-27 VITALS — BP 143/86 | HR 89 | Ht 64.0 in | Wt 238.8 lb

## 2023-12-27 DIAGNOSIS — G44201 Tension-type headache, unspecified, intractable: Secondary | ICD-10-CM | POA: Diagnosis not present

## 2023-12-27 MED ORDER — KETOROLAC TROMETHAMINE 60 MG/2ML IM SOLN
60.0000 mg | Freq: Once | INTRAMUSCULAR | Status: AC
Start: 2023-12-27 — End: 2023-12-27
  Administered 2023-12-27: 60 mg via INTRAMUSCULAR

## 2023-12-27 NOTE — Assessment & Plan Note (Signed)
Presenting today for an acute visit endorsing a 1 week history of tension type headache as described above.  Headache is located in the bilateral frontal region.  No red flag features identified.  No indication to pursue cranial imaging currently.  Will initially treat with Toradol 60 mg IM injection.  Okay to use Tylenol as needed.  She was instructed to return to care if symptoms worsen or fail to improve at which time it would be reasonable to pursue imaging.  Otherwise, she will return to care for previously scheduled follow-up with Dr. Allena Katz in early March.

## 2023-12-27 NOTE — Patient Instructions (Signed)
It was a pleasure to see you today.  Thank you for giving [place] the opportunity to be involved in your care.  Below is a brief recap of your visit and next steps.  We will plan to see you again in March.  Summary Toradol injection ordered today OK to use tylenol for pain relief Follow up as scheduled with Dr. Allena Katz on 3/3.

## 2023-12-27 NOTE — Progress Notes (Signed)
Acute Office Visit  Subjective:     Patient ID: Gina Coleman, female    DOB: Jul 09, 1976, 48 y.o.   MRN: [id number]  Chief Complaint  Patient presents with   Headache    Bad headache for over a week    Gina Coleman has been evaluated today for an acute visit in the setting of persistent headache.  She endorses a one week history of headache located along the frontal region of her head bilaterally.  There was no head trauma prior to the onset of headache.  She endorses flulike symptoms 2 weeks ago.  Headache onset started around the time that flulike symptoms resolved.  No prior history of headache disorder.  Her current headache is described as a constant, mild headache with waxing/waning intensity.  Intensity increases can be brief or last close to an hour.  Her headache is throbbing in nature and feels as though someone is sitting on top of her head.  She also describes a "really bad brain freeze". She describes a bandlike sensation.  Denies photophobia, phonophobia, and nausea/vomiting.  Symptoms somewhat improved with lying down and are worse with movement.  Not associated with exertion.  She has tried taking ibuprofen and Tylenol, which lessen her symptoms but did not take them completely away.  She additionally endorses a lingering cough.  Review of Systems  Constitutional:  Negative for chills, fever and malaise/fatigue.  HENT:  Negative for congestion, ear discharge, ear pain, hearing loss, sinus pain and tinnitus.   Respiratory:  Negative for cough, hemoptysis, sputum production, shortness of breath and wheezing.   Cardiovascular:  Negative for chest pain and palpitations.  Gastrointestinal:  Negative for nausea and vomiting.  Skin:  Negative for rash.  Neurological:  Positive for headaches. Negative for dizziness, tingling, sensory change, speech change, focal weakness, seizures, loss of consciousness and weakness.      Objective:    BP (!) 143/86 (BP Location: Left Arm,  Patient Position: Sitting, Cuff Size: Large)   Pulse 89   Ht 5\' 4"  (1.626 m)   Wt 238 lb 12.8 oz (108.3 kg)   SpO2 96%   BMI 40.99 kg/m   Physical Exam Vitals reviewed.  Constitutional:      General: She is not in acute distress.    Appearance: Normal appearance. She is obese. She is not toxic-appearing.  HENT:     Head: Normocephalic and atraumatic.     Right Ear: External ear normal.     Left Ear: External ear normal.     Nose: Nose normal. No congestion or rhinorrhea.     Mouth/Throat:     Mouth: Mucous membranes are moist.     Pharynx: Oropharynx is clear. No oropharyngeal exudate or posterior oropharyngeal erythema.  Eyes:     General: No scleral icterus.    Extraocular Movements: Extraocular movements intact.     Conjunctiva/sclera: Conjunctivae normal.     Pupils: Pupils are equal, round, and reactive to light.  Cardiovascular:     Rate and Rhythm: Normal rate and regular rhythm.     Pulses: Normal pulses.     Heart sounds: Normal heart sounds. No murmur heard.    No friction rub. No gallop.  Pulmonary:     Effort: Pulmonary effort is normal.     Breath sounds: Normal breath sounds. No wheezing, rhonchi or rales.  Abdominal:     General: Abdomen is flat. Bowel sounds are normal. There is no distension.     Palpations: Abdomen is  soft.     Tenderness: There is no abdominal tenderness.  Musculoskeletal:        General: No swelling. Normal range of motion.     Cervical back: Normal range of motion.     Right lower leg: No edema.     Left lower leg: No edema.  Lymphadenopathy:     Cervical: No cervical adenopathy.  Skin:    General: Skin is warm and dry.     Capillary Refill: Capillary refill takes less than 2 seconds.     Coloration: Skin is not jaundiced.  Neurological:     General: No focal deficit present.     Mental Status: She is alert and oriented to person, place, and time.  Psychiatric:        Mood and Affect: Mood normal.        Behavior: Behavior  normal.       Assessment & Plan:   Problem List Items Addressed This Visit       Acute intractable tension-type headache - Primary   Presenting today for an acute visit endorsing a 1 week history of tension type headache as described above.  Headache is located in the bilateral frontal region.  No red flag features identified.  No indication to pursue cranial imaging currently.  Will initially treat with Toradol 60 mg IM injection.  Okay to use Tylenol as needed.  She was instructed to return to care if symptoms worsen or fail to improve at which time it would be reasonable to pursue imaging.  Otherwise, she will return to care for previously scheduled follow-up with Dr. Allena Katz in early March.      Meds ordered this encounter  Medications   ketorolac (TORADOL) injection 60 mg   Return if symptoms worsen or fail to improve.  Billie Lade, MD

## 2024-01-28 ENCOUNTER — Other Ambulatory Visit: Payer: Self-pay | Admitting: Internal Medicine

## 2024-01-28 DIAGNOSIS — I10 Essential (primary) hypertension: Secondary | ICD-10-CM

## 2024-01-31 ENCOUNTER — Ambulatory Visit: Payer: BC Managed Care – PPO | Admitting: Internal Medicine

## 2024-02-02 ENCOUNTER — Encounter: Payer: Self-pay | Admitting: Internal Medicine

## 2024-02-02 ENCOUNTER — Ambulatory Visit (INDEPENDENT_AMBULATORY_CARE_PROVIDER_SITE_OTHER): Payer: BC Managed Care – PPO | Admitting: Internal Medicine

## 2024-02-02 VITALS — BP 126/78 | HR 93 | Ht 64.0 in | Wt 239.4 lb

## 2024-02-02 DIAGNOSIS — E782 Mixed hyperlipidemia: Secondary | ICD-10-CM

## 2024-02-02 DIAGNOSIS — E042 Nontoxic multinodular goiter: Secondary | ICD-10-CM

## 2024-02-02 DIAGNOSIS — K219 Gastro-esophageal reflux disease without esophagitis: Secondary | ICD-10-CM | POA: Diagnosis not present

## 2024-02-02 DIAGNOSIS — I10 Essential (primary) hypertension: Secondary | ICD-10-CM

## 2024-02-02 DIAGNOSIS — F411 Generalized anxiety disorder: Secondary | ICD-10-CM | POA: Diagnosis not present

## 2024-02-02 MED ORDER — ESCITALOPRAM OXALATE 5 MG PO TABS: 5.0000 mg | ORAL_TABLET | Freq: Every day | ORAL | 2 refills | Status: DC

## 2024-02-02 NOTE — Assessment & Plan Note (Signed)
Checked lipid profile Advised to follow DASH diet 

## 2024-02-02 NOTE — Assessment & Plan Note (Signed)
Well-controlled overall, but has occasional heartburn On Pepcid 40 mg QD On Omeprazole PRN

## 2024-02-02 NOTE — Patient Instructions (Signed)
 Please start taking Lexapro as prescribed.  Please perform simple relaxation activities as discussed.  Please continue to take medications as prescribed.  Please continue to follow low carb diet and perform moderate exercise/walking at least 150 mins/week.

## 2024-02-02 NOTE — Assessment & Plan Note (Signed)
Well-controlled On Amlodipine 5 mg QD Advised to follow DASH diet and perform moderate exercise as tolerated 

## 2024-02-02 NOTE — Assessment & Plan Note (Signed)
On Methimazole 5 mg BID Follows Endocrinology Last US thyroid (02/24) reviewed Last TSH and T4 wnl

## 2024-02-02 NOTE — Assessment & Plan Note (Signed)
 Uncontrolled, likely due to recent relationship disturbance with her husband Started Lexapro 5 mg QD Discussed about role of BH therapy -referred to [facility] therapy Counseled about relaxation activities

## 2024-02-02 NOTE — Progress Notes (Signed)
 Established Patient Office Visit  Subjective:  Patient ID: Gina Coleman, female    DOB: 17-May-1976  Age: 48 y.o. MRN: [id number]  CC:  Chief Complaint  Patient presents with   Care Management    Six month follow up    Anxiety    Patient feels she needs to be on a medication to help her anxiety     HPI Gina Coleman is a 48 y.o. female with past medical history of hypertension and subclinical hyperthyroidism who presents for f/u of her chronic medical conditions.  HTN: BP is well-controlled. Takes medications regularly. Patient denies headache, dizziness, chest pain, dyspnea.  Hyperthyroidism: On Methimazole.  Followed by endocrinology.   She has been taking Pepcid PRN for GERD.  Her acid reflux symptoms are better now.  Denies any dysphagia or odynophagia.  GAD: She reports feeling anxious on a daily basis.  She had caught her husband cheating on her about 2 years ago, but stayed in relationship.  She recently found out some disturbing messages in his phone again, which has caused worsening of her anxiety.  She has insomnia and crying spells at times.  She has good family support.  Denies any SI or HI currently.  Past Medical History:  Diagnosis Date   Acid reflux    Anxiety    Headache    Hypertension    Hyperthyroidism     Past Surgical History:  Procedure Laterality Date   BIOPSY  06/05/2021   Procedure: BIOPSY;  Surgeon: Malissa Hippo, MD;  Location: [facility];  Service: Endoscopy;;   COLONOSCOPY N/A 06/05/2021   rehman:The examined portion of the ileum was normal. one 4mm polyp in cecum (tubular adenoma). external hemorrhoids   ESOPHAGOGASTRODUODENOSCOPY N/A 06/05/2021   Rehman:Normal hypopharynx.normal esophagus, z line irregular 36 cm from incisors, gastritics with reactive gastropathy, normal duodenal bulb and second portion of duodenum   FOOT SURGERY Right    LEG SURGERY Right    TRANSFORAMINAL LUMBAR INTERBODY FUSION W/ MIS 1 LEVEL N/A 12/08/2021    Procedure: Lumbar Three-Four Minimally Invasive Transforaminal Lumbar Interbody Fusion;  Surgeon: Jadene Pierini, MD;  Location: [facility] OR;  Service: Neurosurgery;  Laterality: N/A;  Lumbar Three-Four Minimally Invasive Transforaminal Lumbar Interbody Fusion   TUBAL LIGATION      Family History  Problem Relation Age of Onset   CAD Mother        Premature disease   Hypertension Sister    Breast cancer Maternal Grandmother    HIV Sister    Asthma Son     Social History   Socioeconomic History   Marital status: Married    Spouse name: Not on file   Number of children: Not on file   Years of education: Not on file   Highest education level: Not on file  Occupational History   Not on file  Tobacco Use   Smoking status: Former    Types: Cigarettes   Smokeless tobacco: Never  Vaping Use   Vaping status: Never Used  Substance and Sexual Activity   Alcohol use: Yes    Alcohol/week: 3.0 standard drinks of alcohol    Types: 3 Glasses of wine per week    Comment: social    Drug use: Never   Sexual activity: Yes    Birth control/protection: Surgical    Comment: tubal  Other Topics Concern   Not on file  Social History Narrative   Not on file   Social Drivers of Health   Financial  Resource Strain: Low Risk  (09/20/2023)   Overall Financial Resource Strain (CARDIA)    Difficulty of Paying Living Expenses: Not hard at all  Food Insecurity: No Food Insecurity (09/20/2023)   Hunger Vital Sign    Worried About Running Out of Food in the Last Year: Never true    Ran Out of Food in the Last Year: Never true  Transportation Needs: No Transportation Needs (09/20/2023)   PRAPARE - Administrator, Civil Service (Medical): No    Lack of Transportation (Non-Medical): No  Physical Activity: Inactive (09/20/2023)   Exercise Vital Sign    Days of Exercise per Week: 0 days    Minutes of Exercise per Session: 10 min  Stress: No Stress Concern Present (09/20/2023)    [employer] of Occupational Health - Occupational Stress Questionnaire    Feeling of Stress : Only a little  Social Connections: Socially Integrated (09/20/2023)   Social Connection and Isolation Panel [NHANES]    Frequency of Communication with Friends and Family: More than three times a week    Frequency of Social Gatherings with Friends and Family: Once a week    Attends Religious Services: More than 4 times per year    Active Member of [employer] or Organizations: Yes    Attends Banker Meetings: Never    Marital Status: Married  Catering manager Violence: Not At Risk (09/20/2023)   Humiliation, Afraid, Rape, and Kick questionnaire    Fear of Current or Ex-Partner: No    Emotionally Abused: No    Physically Abused: No    Sexually Abused: No    Outpatient Medications Prior to Visit  Medication Sig Dispense Refill   amLODipine (NORVASC) 5 MG tablet TAKE 1 TABLET(5 MG) BY MOUTH DAILY 90 tablet 1   celecoxib (CELEBREX) 200 MG capsule TAKE 1 CAPSULE(200 MG) BY MOUTH TWICE DAILY AS NEEDED 30 capsule 1   famotidine (PEPCID) 40 MG tablet TAKE 1 TABLET(40 MG) BY MOUTH DAILY IN THE EVENING 90 tablet 3   methimazole (TAPAZOLE) 5 MG tablet TAKE 1 TABLET(5 MG) BY MOUTH DAILY 30 tablet 0   Omega-3 Fatty Acids (FISH OIL) 1200 MG CAPS Take 1,200 mg by mouth daily.     triamcinolone cream (KENALOG) 0.1 % Apply 1 application topically daily as needed (Eczema).     UNABLE TO FIND Black seed oil-daily     No facility-administered medications prior to visit.    Allergies  Allergen Reactions   Other Rash     GLOVE POWDER     Flexeril [Cyclobenzaprine] Palpitations    Happened with 5 mg dose    ROS Review of Systems  Constitutional:  Negative for chills and fever.  HENT:  Negative for congestion, sinus pressure, sinus pain and sore throat.   Eyes:  Negative for pain and discharge.  Respiratory:  Negative for cough and shortness of breath.   Cardiovascular:  Negative for  chest pain and palpitations.  Gastrointestinal:  Negative for diarrhea, nausea and vomiting.  Endocrine: Negative for polydipsia and polyuria.  Genitourinary:  Negative for dysuria and hematuria.  Musculoskeletal:  Negative for neck pain and neck stiffness.  Skin:  Negative for rash.  Neurological:  Negative for dizziness and weakness.  Psychiatric/Behavioral:  Positive for sleep disturbance. Negative for agitation and behavioral problems. The patient is nervous/anxious.       Objective:    Physical Exam Vitals reviewed.  Constitutional:      General: She is not in acute distress.  Appearance: She is obese. She is not diaphoretic.  HENT:     Head: Normocephalic and atraumatic.     Nose: Nose normal. No congestion.     Mouth/Throat:     Mouth: Mucous membranes are moist.     Pharynx: No posterior oropharyngeal erythema.  Eyes:     General: No scleral icterus.    Extraocular Movements: Extraocular movements intact.  Neck:     Thyroid: Thyromegaly present. No thyroid tenderness.  Cardiovascular:     Rate and Rhythm: Normal rate and regular rhythm.     Heart sounds: Normal heart sounds. No murmur heard. Pulmonary:     Breath sounds: Normal breath sounds. No wheezing or rales.  Musculoskeletal:     Cervical back: Normal range of motion. No rigidity.     Right lower leg: No edema.     Left lower leg: No edema.  Skin:    General: Skin is warm.     Findings: No rash.  Neurological:     General: No focal deficit present.     Mental Status: She is alert and oriented to person, place, and time.     Sensory: No sensory deficit.     Motor: No weakness.  Psychiatric:        Mood and Affect: Mood is anxious. Affect is tearful.        Behavior: Behavior normal.     BP 126/78 (BP Location: Left Arm, Patient Position: Sitting, Cuff Size: Large)   Pulse 93   Ht 5\' 4"  (1.626 m)   Wt 239 lb 6.4 oz (108.6 kg)   SpO2 98%   BMI 41.09 kg/m  Wt Readings from Last 3 Encounters:   02/02/24 239 lb 6.4 oz (108.6 kg)  12/27/23 238 lb 12.8 oz (108.3 kg)  09/21/23 240 lb 6.4 oz (109 kg)    Lab Results  Component Value Date   TSH 1.460 09/21/2023   Lab Results  Component Value Date   WBC 6.7 09/21/2023   HGB 12.4 09/21/2023   HCT 38.4 09/21/2023   MCV 91 09/21/2023   PLT 227 09/21/2023   Lab Results  Component Value Date   NA 139 09/21/2023   K 4.1 09/21/2023   CO2 24 09/21/2023   GLUCOSE 106 (H) 09/21/2023   BUN 12 09/21/2023   CREATININE 0.72 09/21/2023   BILITOT 0.3 09/21/2023   ALKPHOS 138 (H) 09/21/2023   AST 17 09/21/2023   ALT 15 09/21/2023   PROT 7.1 09/21/2023   ALBUMIN 4.0 09/21/2023   CALCIUM 9.1 09/21/2023   ANIONGAP 7 10/26/2022   EGFR 103 09/21/2023   GFR 69.56 05/13/2020   Lab Results  Component Value Date   CHOL 185 09/21/2023   Lab Results  Component Value Date   HDL 57 09/21/2023   Lab Results  Component Value Date   LDLCALC 108 (H) 09/21/2023   Lab Results  Component Value Date   TRIG 114 09/21/2023   Lab Results  Component Value Date   CHOLHDL 3.2 09/21/2023   Lab Results  Component Value Date   HGBA1C 5.9 (H) 09/21/2023      Assessment & Plan:   Problem List Items Addressed This Visit    Problem List Items Addressed This Visit       Cardiovascular and Mediastinum   Hypertension - Primary   Well-controlled On Amlodipine 5 mg QD Advised to follow DASH diet and perform moderate exercise as tolerated        Digestive  GERD (gastroesophageal reflux disease)   Well-controlled overall, but has occasional heartburn On Pepcid 40 mg QD On Omeprazole PRN        Endocrine   Multinodular goiter   On Methimazole 5 mg BID Follows Endocrinology Last US thyroid (02/24) reviewed Last TSH and T4 wnl        Other   Mixed hyperlipidemia   Checked lipid profile Advised to follow DASH diet      GAD (generalized anxiety disorder)   Uncontrolled, likely due to recent relationship disturbance with her  husband Started Lexapro 5 mg QD Discussed about role of BH therapy -referred to [facility] therapy Counseled about relaxation activities      Relevant Medications   escitalopram (LEXAPRO) 5 MG tablet   Other Relevant Orders   Ambulatory referral to Psychology        Meds ordered this encounter  Medications   escitalopram (LEXAPRO) 5 MG tablet    Sig: Take 1 tablet (5 mg total) by mouth daily.    Dispense:  30 tablet    Refill:  2    Follow-up: Return in about 3 months (around 05/04/2024) for GAD.    Anabel Halon, MD

## 2024-02-22 ENCOUNTER — Other Ambulatory Visit: Payer: Self-pay | Admitting: Internal Medicine

## 2024-02-22 DIAGNOSIS — M25512 Pain in left shoulder: Secondary | ICD-10-CM

## 2024-03-13 ENCOUNTER — Other Ambulatory Visit: Payer: Self-pay | Admitting: Internal Medicine

## 2024-03-27 ENCOUNTER — Telehealth: Payer: Self-pay | Admitting: Internal Medicine

## 2024-03-27 DIAGNOSIS — E042 Nontoxic multinodular goiter: Secondary | ICD-10-CM

## 2024-03-27 NOTE — Telephone Encounter (Signed)
 Patient requested a referral to [facility] - closer to home

## 2024-03-28 NOTE — Telephone Encounter (Signed)
 Patient notified

## 2024-04-04 ENCOUNTER — Encounter: Payer: Self-pay | Admitting: Adult Health

## 2024-04-04 ENCOUNTER — Ambulatory Visit: Admitting: Adult Health

## 2024-04-04 VITALS — BP 132/83 | HR 85 | Ht 64.0 in | Wt 242.5 lb

## 2024-04-04 DIAGNOSIS — N946 Dysmenorrhea, unspecified: Secondary | ICD-10-CM | POA: Diagnosis not present

## 2024-04-04 DIAGNOSIS — R102 Pelvic and perineal pain: Secondary | ICD-10-CM

## 2024-04-04 DIAGNOSIS — K649 Unspecified hemorrhoids: Secondary | ICD-10-CM | POA: Diagnosis not present

## 2024-04-04 DIAGNOSIS — D219 Benign neoplasm of connective and other soft tissue, unspecified: Secondary | ICD-10-CM | POA: Diagnosis not present

## 2024-04-04 NOTE — Progress Notes (Signed)
  Subjective:     Patient ID: Gina Coleman, female   DOB: Dec 13, 1975, 48 y.o.   MRN: [id number]  HPI Gina Coleman is a 48 year old black female, married, [phone] in complaining of pelvic pain that goes and comes and worse when on period(has fiborids) and feels like has hemorrhoid.    Component Value Date/Time   DIAGPAP  06/22/2023 1548    - Negative for intraepithelial lesion or malignancy (NILM)   DIAGPAP  06/14/2020 1537    - Negative for intraepithelial lesion or malignancy (NILM)   HPVHIGH Negative 06/22/2023 1548   HPVHIGH Negative 06/14/2020 1537   ADEQPAP  06/22/2023 1548    Satisfactory for evaluation; transformation zone component PRESENT.   ADEQPAP  06/14/2020 1537    Satisfactory for evaluation; transformation zone component PRESENT.    PCP is Dr Lydia Sams  Review of Systems  +pelvic pain that goes and comes and worse when on period(has fiborids) and feels like has hemorrhoid.   Reviewed past medical,surgical, social and family history. Reviewed medications and allergies.  Objective:   Physical Exam BP 132/83 (BP Location: Left Arm, Patient Position: Sitting, Cuff Size: Large)   Pulse 85   Ht 5\' 4"  (1.626 m)   Wt 242 lb 8 oz (110 kg)   LMP 03/14/2024 (Exact Date)   BMI 41.63 kg/m     Skin warm and dry.Pelvic: external genitalia is normal in appearance no lesions, vagina: white discharge without odor,urethra has no lesions or masses noted, cervix:smooth and bulbous, uterus: ULNS, mildly tender, no masses felt, adnexa: no masses, LLQ tenderness noted. Bladder is non tender and no masses felt. On rectal exam has internal hemorrhoid.   Upstream - 04/04/24 1516       Pregnancy Intention Screening   Does the patient want to become pregnant in the next year? No    Does the patient's partner want to become pregnant in the next year? No    Would the patient like to discuss contraceptive options today? No      Contraception Wrap Up   Current Method Female Sterilization    End  Method Female Sterilization    Contraception Counseling Provided No            Examination chaperoned by Alphonso Aschoff LPN  Assessment:     1. Pelvic pain (Primary) +pelvic pain that comes and goes Will get pelvic US  to assess uterus and ovaries Take ibuprofen   - US  PELVIC COMPLETE WITH TRANSVAGINAL; Future  2. Fibroid Will get US  to assess any growth  - US  PELVIC COMPLETE WITH TRANSVAGINAL; Future  3. Hemorrhoids, unspecified hemorrhoid type Use sitz bath, preparation H and wet wipes or TUCKS  4. Dysmenorrhea Pain worse when on period    Plan:     Return in about 2 weeks for pelvic US

## 2024-04-19 ENCOUNTER — Ambulatory Visit (INDEPENDENT_AMBULATORY_CARE_PROVIDER_SITE_OTHER): Admitting: Radiology

## 2024-04-19 DIAGNOSIS — R102 Pelvic and perineal pain: Secondary | ICD-10-CM | POA: Diagnosis not present

## 2024-04-19 DIAGNOSIS — D219 Benign neoplasm of connective and other soft tissue, unspecified: Secondary | ICD-10-CM

## 2024-04-19 NOTE — Progress Notes (Signed)
 GYN US : TA and TV imaging performed - vinyl probe cover used - Chaperone: Whitney Anteverted uterus normal in size, symmetrical, homogeneous myometrium with single small intramural fibroid off mid anterior myometrium = 23 x 22 x 18 mm. Endom thickness = 5.6 mm, uniform avascular cavity and canal, no evidence of intracavitary defects Nl ov's, mobile, neg adnexal regions, neg CDS, no free fluid present

## 2024-04-26 ENCOUNTER — Ambulatory Visit: Payer: Self-pay | Admitting: Adult Health

## 2024-05-10 ENCOUNTER — Encounter: Payer: Self-pay | Admitting: Internal Medicine

## 2024-05-10 ENCOUNTER — Ambulatory Visit (INDEPENDENT_AMBULATORY_CARE_PROVIDER_SITE_OTHER): Admitting: Internal Medicine

## 2024-05-10 VITALS — BP 131/83 | HR 84 | Ht 64.0 in | Wt 241.2 lb

## 2024-05-10 DIAGNOSIS — E042 Nontoxic multinodular goiter: Secondary | ICD-10-CM | POA: Diagnosis not present

## 2024-05-10 DIAGNOSIS — I10 Essential (primary) hypertension: Secondary | ICD-10-CM

## 2024-05-10 DIAGNOSIS — M25561 Pain in right knee: Secondary | ICD-10-CM

## 2024-05-10 DIAGNOSIS — K219 Gastro-esophageal reflux disease without esophagitis: Secondary | ICD-10-CM

## 2024-05-10 DIAGNOSIS — F411 Generalized anxiety disorder: Secondary | ICD-10-CM | POA: Diagnosis not present

## 2024-05-10 DIAGNOSIS — G8929 Other chronic pain: Secondary | ICD-10-CM | POA: Insufficient documentation

## 2024-05-10 DIAGNOSIS — M25562 Pain in left knee: Secondary | ICD-10-CM

## 2024-05-10 MED ORDER — AMLODIPINE BESYLATE 5 MG PO TABS
5.0000 mg | ORAL_TABLET | Freq: Every day | ORAL | 3 refills | Status: DC
Start: 2024-05-10 — End: 2024-08-15

## 2024-05-10 MED ORDER — METHIMAZOLE 5 MG PO TABS
5.0000 mg | ORAL_TABLET | Freq: Every day | ORAL | 0 refills | Status: DC
Start: 2024-05-10 — End: 2024-07-18

## 2024-05-10 MED ORDER — ESCITALOPRAM OXALATE 5 MG PO TABS
5.0000 mg | ORAL_TABLET | Freq: Every day | ORAL | 5 refills | Status: DC
Start: 2024-05-10 — End: 2024-09-07

## 2024-05-10 NOTE — Patient Instructions (Addendum)
 Please get x-rays of knees done at [facility].  Please perform simple knee exercises as shown in the material.  Please continue to take medications as prescribed.  Please continue to follow low salt diet and perform moderate exercise/walking at least 150 mins/week.

## 2024-05-10 NOTE — Assessment & Plan Note (Signed)
 Bilateral knee pain with mild swelling Check x-ray of bilateral knee Could be patellofemoral syndrome, pain worse with prolonged standing Advised to take Tylenol  as needed for pain, can alternate with Celebrex  as needed Simple knee exercises material provided

## 2024-05-10 NOTE — Progress Notes (Signed)
 Established Patient Office Visit  Subjective:  Patient ID: Gina Coleman, female    DOB: 03/25/1976  Age: 48 y.o. MRN: [id number]  CC:  Chief Complaint  Patient presents with   Medical Management of Chronic Issues    3 month f/u   Knee Pain    Reports knee pain, ongoing for 1 month. Pain is constant.     HPI Gina Coleman is a 49 y.o. female with past medical history of hypertension and subclinical hyperthyroidism who presents for f/u of her chronic medical conditions.  HTN: BP is well-controlled. Takes medications regularly. Patient denies headache, dizziness, chest pain, dyspnea.  Hyperthyroidism: On Methimazole .  Followed by endocrinology, but is planned to see new endocrinology provider in [place] area.  Requests refill of methimazole  for now.  She has been taking Pepcid  PRN for GERD.  Her acid reflux symptoms are better now.  Denies any dysphagia or odynophagia.  GAD: She reports feeling anxious on a daily basis. She had disturbance in her marital relationship about 3-4 months ago.  She found out some disturbing messages in her husband's phone, which had caused worsening of her anxiety.  She has insomnia and crying spells at times.  She was given Lexapro , but has taken it as needed instead of QD, but agrees to take it every day now.  She has good family support.  Denies any SI or HI currently.  Bilateral knee pain: She reports bilateral knee pain and swelling for the last 6 months, which is intermittent and worse at the end of the day.  She has tried using Tiger balm with mild relief.  Denies any recent injury.  Past Medical History:  Diagnosis Date   Acid reflux    Anxiety    Headache    Hypertension    Hyperthyroidism     Past Surgical History:  Procedure Laterality Date   BIOPSY  06/05/2021   Procedure: BIOPSY;  Surgeon: Ruby Corporal, MD;  [facility]: [facility];  Service: Endoscopy;;   COLONOSCOPY N/A 06/05/2021   rehman:The examined portion of the  ileum was normal. one 4mm polyp in cecum (tubular adenoma). external hemorrhoids   ESOPHAGOGASTRODUODENOSCOPY N/A 06/05/2021   Rehman:Normal hypopharynx.normal esophagus, z line irregular 36 cm from incisors, gastritics with reactive gastropathy, normal duodenal bulb and second portion of duodenum   FOOT SURGERY Right    LEG SURGERY Right    TRANSFORAMINAL LUMBAR INTERBODY FUSION W/ MIS 1 LEVEL N/A 12/08/2021   Procedure: Lumbar Three-Four Minimally Invasive Transforaminal Lumbar Interbody Fusion;  Surgeon: Cannon Champion, MD;  Location: [facility];  Service: Neurosurgery;  Laterality: N/A;  Lumbar Three-Four Minimally Invasive Transforaminal Lumbar Interbody Fusion   TUBAL LIGATION      Family History  Problem Relation Age of Onset   Breast cancer Maternal Grandmother    CAD Mother        Premature disease   Hypertension Sister    HIV Sister    Asthma Son    Diabetes Son        Type 1    Social History   Socioeconomic History   Marital status: Married    Spouse name: Not on file   Number of children: Not on file   Years of education: Not on file   Highest education level: Not on file  Occupational History   Not on file  Tobacco Use   Smoking status: Former    Types: Cigarettes   Smokeless tobacco: Never  Vaping Use   Vaping  status: Never Used  Substance and Sexual Activity   Alcohol use: Yes    Alcohol/week: 3.0 standard drinks of alcohol    Types: 3 Glasses of wine per week    Comment: social    Drug use: Never   Sexual activity: Yes    Birth control/protection: Surgical    Comment: tubal  Other Topics Concern   Not on file  Social History Narrative   Not on file   Social Drivers of Health   Financial Resource Strain: Low Risk  (09/20/2023)   Overall Financial Resource Strain (CARDIA)    Difficulty of Paying Living Expenses: Not hard at all  Food Insecurity: No Food Insecurity (09/20/2023)   Hunger Vital Sign    Worried About Running Out of Food in the Last  Year: Never true    Ran Out of Food in the Last Year: Never true  Transportation Needs: No Transportation Needs (09/20/2023)   PRAPARE - Administrator, Civil Service (Medical): No    Lack of Transportation (Non-Medical): No  Physical Activity: Inactive (09/20/2023)   Exercise Vital Sign    Days of Exercise per Week: 0 days    Minutes of Exercise per Session: 10 min  Stress: No Stress Concern Present (09/20/2023)   [employer] of Occupational Health - Occupational Stress Questionnaire    Feeling of Stress : Only a little  Social Connections: Socially Integrated (09/20/2023)   Social Connection and Isolation Panel [NHANES]    Frequency of Communication with Friends and Family: More than three times a week    Frequency of Social Gatherings with Friends and Family: Once a week    Attends Religious Services: More than 4 times per year    Active Member of [employer] or Organizations: Yes    Attends Banker Meetings: Never    Marital Status: Married  Catering manager Violence: Not At Risk (09/20/2023)   Humiliation, Afraid, Rape, and Kick questionnaire    Fear of Current or Ex-Partner: No    Emotionally Abused: No    Physically Abused: No    Sexually Abused: No    Outpatient Medications Prior to Visit  Medication Sig Dispense Refill   celecoxib  (CELEBREX ) 200 MG capsule TAKE 1 CAPSULE(200 MG) BY MOUTH TWICE DAILY AS NEEDED 30 capsule 1   famotidine  (PEPCID ) 40 MG tablet TAKE 1 TABLET(40 MG) BY MOUTH DAILY IN THE EVENING 90 tablet 3   Omega-3 Fatty Acids (FISH OIL) 1200 MG CAPS Take 1,200 mg by mouth daily.     triamcinolone  cream (KENALOG ) 0.1 % Apply 1 application topically daily as needed (Eczema).     UNABLE TO FIND Black seed oil-daily     amLODipine  (NORVASC ) 5 MG tablet TAKE 1 TABLET(5 MG) BY MOUTH DAILY 90 tablet 1   escitalopram  (LEXAPRO ) 5 MG tablet Take 1 tablet (5 mg total) by mouth daily. (Patient taking differently: Take 5 mg by mouth as needed.)  30 tablet 2   methimazole  (TAPAZOLE ) 5 MG tablet TAKE 1 TABLET(5 MG) BY MOUTH DAILY 30 tablet 0   No facility-administered medications prior to visit.    Allergies  Allergen Reactions   Other Rash     GLOVE POWDER     Flexeril  [Cyclobenzaprine ] Palpitations    Happened with 5 mg dose    ROS Review of Systems  Constitutional:  Negative for chills and fever.  HENT:  Negative for congestion, sinus pressure, sinus pain and sore throat.   Eyes:  Negative for pain and  discharge.  Respiratory:  Negative for cough and shortness of breath.   Cardiovascular:  Negative for chest pain and palpitations.  Gastrointestinal:  Negative for diarrhea, nausea and vomiting.  Endocrine: Negative for polydipsia and polyuria.  Genitourinary:  Negative for dysuria and hematuria.  Musculoskeletal:  Positive for arthralgias. Negative for neck pain and neck stiffness.  Skin:  Negative for rash.  Neurological:  Negative for dizziness and weakness.  Psychiatric/Behavioral:  Positive for sleep disturbance. Negative for agitation and behavioral problems. The patient is nervous/anxious.       Objective:    Physical Exam Vitals reviewed.  Constitutional:      General: She is not in acute distress.    Appearance: She is obese. She is not diaphoretic.  HENT:     Head: Normocephalic and atraumatic.     Nose: Nose normal. No congestion.     Mouth/Throat:     Mouth: Mucous membranes are moist.     Pharynx: No posterior oropharyngeal erythema.  Eyes:     General: No scleral icterus.    Extraocular Movements: Extraocular movements intact.  Neck:     Thyroid : Thyromegaly present. No thyroid  tenderness.  Cardiovascular:     Rate and Rhythm: Normal rate and regular rhythm.     Heart sounds: Normal heart sounds. No murmur heard. Pulmonary:     Breath sounds: Normal breath sounds. No wheezing or rales.  Musculoskeletal:     Cervical back: Normal range of motion. No rigidity.     Right knee: No swelling.  No tenderness.     Left knee: Swelling (Mild) present. No tenderness.     Right lower leg: No edema.     Left lower leg: No edema.  Skin:    General: Skin is warm.     Findings: No rash.  Neurological:     General: No focal deficit present.     Mental Status: She is alert and oriented to person, place, and time.     Sensory: No sensory deficit.     Motor: No weakness.  Psychiatric:        Mood and Affect: Mood is anxious. Affect is tearful.        Behavior: Behavior normal.     BP 131/83   Pulse 84   Ht 5' 4 (1.626 m)   Wt 241 lb 3.2 oz (109.4 kg)   SpO2 98%   BMI 41.40 kg/m  Wt Readings from Last 3 Encounters:  05/10/24 241 lb 3.2 oz (109.4 kg)  04/04/24 242 lb 8 oz (110 kg)  02/02/24 239 lb 6.4 oz (108.6 kg)    Lab Results  Component Value Date   TSH 1.460 09/21/2023   Lab Results  Component Value Date   WBC 6.7 09/21/2023   HGB 12.4 09/21/2023   HCT 38.4 09/21/2023   MCV 91 09/21/2023   PLT 227 09/21/2023   Lab Results  Component Value Date   NA 139 09/21/2023   K 4.1 09/21/2023   CO2 24 09/21/2023   GLUCOSE 106 (H) 09/21/2023   BUN 12 09/21/2023   CREATININE 0.72 09/21/2023   BILITOT 0.3 09/21/2023   ALKPHOS 138 (H) 09/21/2023   AST 17 09/21/2023   ALT 15 09/21/2023   PROT 7.1 09/21/2023   ALBUMIN 4.0 09/21/2023   CALCIUM 9.1 09/21/2023   ANIONGAP 7 10/26/2022   EGFR 103 09/21/2023   GFR 69.56 05/13/2020   Lab Results  Component Value Date   CHOL 185 09/21/2023   Lab Results  Component Value Date   HDL 57 09/21/2023   Lab Results  Component Value Date   LDLCALC 108 (H) 09/21/2023   Lab Results  Component Value Date   TRIG 114 09/21/2023   Lab Results  Component Value Date   CHOLHDL 3.2 09/21/2023   Lab Results  Component Value Date   HGBA1C 5.9 (H) 09/21/2023      Assessment & Plan:   Problem List Items Addressed This Visit    Problem List Items Addressed This Visit       Cardiovascular and Mediastinum   Hypertension    BP: 131/83   Well-controlled On Amlodipine  5 mg QD Advised to follow DASH diet and perform moderate exercise as tolerated      Relevant Medications   amLODipine  (NORVASC ) 5 MG tablet     Digestive   GERD (gastroesophageal reflux disease)   Well-controlled overall, but has occasional heartburn On Pepcid  40 mg QD On Omeprazole  PRN        Endocrine   Multinodular goiter - Primary   On Methimazole  5 mg once daily, refilled for now until her new endocrinology visit Last US  thyroid  (02/24) reviewed Last TSH and T4 wnl      Relevant Medications   methimazole  (TAPAZOLE ) 5 MG tablet     Other   GAD (generalized anxiety disorder) (Chronic)   Better controlled, likely due to recent relationship disturbance with her husband Had started Lexapro  5 mg QD in the last visit, needs to take it regularly instead of as needed Discussed about role of BH therapy -referred to [facility] therapy Counseled about relaxation activities      Relevant Medications   escitalopram  (LEXAPRO ) 5 MG tablet   Chronic pain of both knees   Bilateral knee pain with mild swelling Check x-ray of bilateral knee Could be patellofemoral syndrome, pain worse with prolonged standing Advised to take Tylenol  as needed for pain, can alternate with Celebrex  as needed Simple knee exercises material provided      Relevant Medications   escitalopram  (LEXAPRO ) 5 MG tablet   Other Relevant Orders   DG Knee Complete 4 Views Right   DG Knee Complete 4 Views Left         Meds ordered this encounter  Medications   methimazole  (TAPAZOLE ) 5 MG tablet    Sig: Take 1 tablet (5 mg total) by mouth daily.    Dispense:  60 tablet    Refill:  0   amLODipine  (NORVASC ) 5 MG tablet    Sig: Take 1 tablet (5 mg total) by mouth daily.    Dispense:  90 tablet    Refill:  3   escitalopram  (LEXAPRO ) 5 MG tablet    Sig: Take 1 tablet (5 mg total) by mouth daily.    Dispense:  30 tablet    Refill:  5    Follow-up: Return in  about 6 months (around 11/09/2024) for HTN.    Meldon Sport, MD

## 2024-05-10 NOTE — Assessment & Plan Note (Signed)
 On Methimazole  5 mg once daily, refilled for now until her new endocrinology visit Last US  thyroid  (02/24) reviewed Last TSH and T4 wnl

## 2024-05-10 NOTE — Assessment & Plan Note (Signed)
 Better controlled, likely due to recent relationship disturbance with her husband Had started Lexapro  5 mg QD in the last visit, needs to take it regularly instead of as needed Discussed about role of BH therapy -referred to [facility] therapy Counseled about relaxation activities

## 2024-05-10 NOTE — Assessment & Plan Note (Addendum)
 BP: 131/83   Well-controlled On Amlodipine  5 mg QD Advised to follow DASH diet and perform moderate exercise as tolerated

## 2024-05-10 NOTE — Assessment & Plan Note (Signed)
Well-controlled overall, but has occasional heartburn On Pepcid 40 mg QD On Omeprazole PRN

## 2024-05-11 ENCOUNTER — Ambulatory Visit (HOSPITAL_COMMUNITY)
Admission: RE | Admit: 2024-05-11 | Discharge: 2024-05-11 | Disposition: A | Discharge status: Home / Self Care | Source: Ambulatory Visit | Attending: Internal Medicine | Admitting: Internal Medicine

## 2024-05-11 DIAGNOSIS — G8929 Other chronic pain: Secondary | ICD-10-CM | POA: Insufficient documentation

## 2024-05-11 DIAGNOSIS — M25561 Pain in right knee: Secondary | ICD-10-CM | POA: Insufficient documentation

## 2024-05-11 DIAGNOSIS — M25562 Pain in left knee: Secondary | ICD-10-CM | POA: Insufficient documentation

## 2024-05-15 ENCOUNTER — Ambulatory Visit: Payer: Self-pay | Admitting: Internal Medicine

## 2024-05-18 DIAGNOSIS — L700 Acne vulgaris: Secondary | ICD-10-CM | POA: Diagnosis not present

## 2024-05-18 DIAGNOSIS — L728 Other follicular cysts of the skin and subcutaneous tissue: Secondary | ICD-10-CM | POA: Diagnosis not present

## 2024-05-18 DIAGNOSIS — L308 Other specified dermatitis: Secondary | ICD-10-CM | POA: Diagnosis not present

## 2024-06-22 ENCOUNTER — Other Ambulatory Visit (HOSPITAL_COMMUNITY): Payer: Self-pay | Admitting: Adult Health

## 2024-06-22 DIAGNOSIS — Z1231 Encounter for screening mammogram for malignant neoplasm of breast: Secondary | ICD-10-CM

## 2024-06-23 ENCOUNTER — Ambulatory Visit
Admission: EM | Admit: 2024-06-23 | Discharge: 2024-06-23 | Disposition: A | Discharge status: Home / Self Care | Attending: Family Medicine | Admitting: Family Medicine

## 2024-06-23 DIAGNOSIS — K219 Gastro-esophageal reflux disease without esophagitis: Secondary | ICD-10-CM | POA: Diagnosis not present

## 2024-06-23 MED ORDER — LIDOCAINE VISCOUS HCL 2 % MT SOLN
15.0000 mL | Freq: Once | OROMUCOSAL | Status: AC
  Administered 2024-06-23: 15 mL via OROMUCOSAL

## 2024-06-23 MED ORDER — SUCRALFATE 1 G PO TABS: 1.0000 g | ORAL_TABLET | Freq: Three times a day (TID) | ORAL | 0 refills | Status: DC | PRN

## 2024-06-23 MED ORDER — PANTOPRAZOLE SODIUM 40 MG PO TBEC: 40.0000 mg | DELAYED_RELEASE_TABLET | Freq: Every day | ORAL | 0 refills | Status: DC

## 2024-06-23 MED ORDER — ALUM & MAG HYDROXIDE-SIMETH 200-200-20 MG/5ML PO SUSP
30.0000 mL | Freq: Once | ORAL | Status: AC
  Administered 2024-06-23: 30 mL via ORAL

## 2024-06-23 NOTE — ED Provider Notes (Signed)
 [facility]    CSN: [id number] Arrival date & time: 06/23/24  1712      History   Chief Complaint No chief complaint on file.   HPI Gina Coleman is a 48 y.o. female.    Patient presenting today with 1 day history of epigastric pain and pressure, reflux, brash and a small episode of vomiting when she bent over to tie her shoes yesterday.  Denies chest pain, shortness of breath, palpitations, melena, hematemesis, fevers.  History of acid reflux currently on Pepcid  but does not feel like it is strong enough.  Has had multiple episodes similar to this in the past.    Past Medical History:  Diagnosis Date   Acid reflux    Anxiety    Headache    Hypertension    Hyperthyroidism     Patient Active Problem List   Diagnosis Date Noted   Chronic pain of both knees 05/10/2024   GAD (generalized anxiety disorder) 02/02/2024   Acute intractable tension-type headache 12/27/2023   Prediabetes 09/22/2023   Mixed hyperlipidemia 09/22/2023   Fibroid 09/20/2023   Neck pain 08/06/2023   Acute pain of left shoulder 06/30/2023   Leg swelling 04/09/2023   Boil of groin 01/19/2023   Encounter for general adult medical examination with abnormal findings 12/23/2022   Refused influenza vaccine 12/23/2022   Perimenopause 08/11/2022   Hemorrhoids 07/01/2022   S/P lumbar fusion 01/22/2022   Spondylolisthesis of lumbar region 12/08/2021   Mass of upper outer quadrant of left breast 10/02/2021   Breast cyst, left 10/02/2021   GERD (gastroesophageal reflux disease) 02/11/2021   Fibroids, subserous 09/13/2020   Dysmenorrhea 09/13/2020   Menorrhagia with regular cycle 09/13/2020   Peripheral neuropathy 08/22/2020   Chronic back pain 06/14/2020   Hypertension 11/18/2019   Subclinical hyperthyroidism 09/08/2019   Multinodular goiter 07/12/2019   Chest pain 02/23/2018    Past Surgical History:  Procedure Laterality Date   BIOPSY  06/05/2021   Procedure: BIOPSY;  Surgeon:  Golda Claudis PENNER, MD;  [facility]: [facility];  Service: Endoscopy;;   COLONOSCOPY N/A 06/05/2021   rehman:The examined portion of the ileum was normal. one 4mm polyp in cecum (tubular adenoma). external hemorrhoids   ESOPHAGOGASTRODUODENOSCOPY N/A 06/05/2021   Rehman:Normal hypopharynx.normal esophagus, z line irregular 36 cm from incisors, gastritics with reactive gastropathy, normal duodenal bulb and second portion of duodenum   FOOT SURGERY Right    LEG SURGERY Right    TRANSFORAMINAL LUMBAR INTERBODY FUSION W/ MIS 1 LEVEL N/A 12/08/2021   Procedure: Lumbar Three-Four Minimally Invasive Transforaminal Lumbar Interbody Fusion;  Surgeon: Cheryle Debby LABOR, MD;  Location: [facility];  Service: Neurosurgery;  Laterality: N/A;  Lumbar Three-Four Minimally Invasive Transforaminal Lumbar Interbody Fusion   TUBAL LIGATION      OB History     Gravida  3   Para  3   Term  1   Preterm  2   AB      Living  3      SAB      IAB      Ectopic      Multiple      Live Births  3            Home Medications    Prior to Admission medications   Medication Sig Start Date End Date Taking? Authorizing Provider  pantoprazole  (PROTONIX ) 40 MG tablet Take 1 tablet (40 mg total) by mouth daily. 06/23/24  Yes Stuart Vernell Norris, PA-C  sucralfate  (  CARAFATE ) 1 g tablet Take 1 tablet (1 g total) by mouth 3 (three) times daily as needed. May dissolve 1 tablet into a glass of water  and drink up to 3 times daily as needed. 06/23/24  Yes Stuart Vernell Norris, PA-C  amLODipine  (NORVASC ) 5 MG tablet Take 1 tablet (5 mg total) by mouth daily. 05/10/24   Tobie Suzzane POUR, MD  celecoxib  (CELEBREX ) 200 MG capsule TAKE 1 CAPSULE(200 MG) BY MOUTH TWICE DAILY AS NEEDED 02/22/24   Tobie Suzzane POUR, MD  escitalopram  (LEXAPRO ) 5 MG tablet Take 1 tablet (5 mg total) by mouth daily. 05/10/24   Tobie Suzzane POUR, MD  famotidine  (PEPCID ) 40 MG tablet TAKE 1 TABLET(40 MG) BY MOUTH DAILY IN THE EVENING 09/30/23   Tobie Suzzane POUR, MD  methimazole  (TAPAZOLE ) 5 MG tablet Take 1 tablet (5 mg total) by mouth daily. 05/10/24   Tobie Suzzane POUR, MD  Omega-3 Fatty Acids (FISH OIL) 1200 MG CAPS Take 1,200 mg by mouth daily.    [provider]  triamcinolone  cream (KENALOG ) 0.1 % Apply 1 application topically daily as needed (Eczema).    [provider]  UNABLE TO FIND Black seed oil-daily    [provider]  lisinopril (ZESTRIL) 5 MG tablet Take by mouth. 04/14/19 09/21/19  [provider]    Family History Family History  Problem Relation Age of Onset   Breast cancer Maternal Grandmother    CAD Mother        Premature disease   Hypertension Sister    HIV Sister    Asthma Son    Diabetes Son        Type 1    Social History Social History   Tobacco Use   Smoking status: Former    Types: Cigarettes   Smokeless tobacco: Never  Vaping Use   Vaping status: Never Used  Substance Use Topics   Alcohol use: Yes    Alcohol/week: 3.0 standard drinks of alcohol    Types: 3 Glasses of wine per week    Comment: social    Drug use: Never     Allergies   Other and Flexeril  [cyclobenzaprine ]   Review of Systems Review of Systems Per HPI  Physical Exam Triage Vital Signs ED Triage Vitals  Encounter Vitals Group     BP 06/23/24 1731 137/83     Girls Systolic BP Percentile --      Girls Diastolic BP Percentile --      Boys Systolic BP Percentile --      Boys Diastolic BP Percentile --      Pulse Rate 06/23/24 1731 92     Resp 06/23/24 1731 20     Temp 06/23/24 1731 98.3 F (36.8 C)     Temp Source 06/23/24 1731 Oral     SpO2 06/23/24 1731 96 %     Weight --      Height --      Head Circumference --      Peak Flow --      Pain Score 06/23/24 1732 4     Pain Loc --      Pain Education --      Exclude from Growth Chart --    No data found.  Updated Vital Signs BP 137/83 (BP Location: Right Arm)   Pulse 92   Temp 98.3 F (36.8 C) (Oral)   Resp 20   SpO2  96%   Visual Acuity Right Eye Distance:   Left Eye  Distance:   Bilateral Distance:    Right Eye Near:   Left Eye Near:    Bilateral Near:     Physical Exam Vitals and nursing note reviewed.  Constitutional:      Appearance: Normal appearance. She is not ill-appearing.  HENT:     Head: Atraumatic.  Eyes:     Extraocular Movements: Extraocular movements intact.     Conjunctiva/sclera: Conjunctivae normal.  Cardiovascular:     Rate and Rhythm: Normal rate and regular rhythm.     Heart sounds: Normal heart sounds.  Pulmonary:     Effort: Pulmonary effort is normal.     Breath sounds: Normal breath sounds.  Abdominal:     General: Bowel sounds are normal. There is no distension.     Palpations: Abdomen is soft.     Tenderness: There is no abdominal tenderness. There is no guarding.  Musculoskeletal:        General: Normal range of motion.     Cervical back: Normal range of motion and neck supple.  Skin:    General: Skin is warm and dry.  Neurological:     Mental Status: She is alert and oriented to person, place, and time.  Psychiatric:        Mood and Affect: Mood normal.        Thought Content: Thought content normal.        Judgment: Judgment normal.      UC Treatments / Results  Labs (all labs ordered are listed, but only abnormal results are displayed) Labs Reviewed - No data to display  EKG   Radiology No results found.  Procedures Procedures (including critical care time)  Medications Ordered in UC Medications  lidocaine  (XYLOCAINE ) 2 % viscous mouth solution 15 mL (15 mLs Mouth/Throat Given 06/23/24 1753)  alum & mag hydroxide-simeth (MAALOX/MYLANTA) 200-200-20 MG/5ML suspension 30 mL (30 mLs Oral Given 06/23/24 1753)    Initial Impression / Assessment and Plan / UC Course  I have reviewed the triage vital signs and the nursing notes.  Pertinent labs & imaging results that were available during my care of the patient were reviewed by me and  considered in my medical decision making (see chart for details).     Suspect GERD exacerbation.  Treat with GI cocktail in clinic, Carafate  and Protonix  in addition to diet modifications.  Follow-up for worsening or unresolving symptoms.  Final Clinical Impressions(s) / UC Diagnoses   Final diagnoses:  Gastroesophageal reflux disease, unspecified whether esophagitis present   Discharge Instructions   None    ED Prescriptions     Medication Sig Dispense Auth. Provider   sucralfate  (CARAFATE ) 1 g tablet Take 1 tablet (1 g total) by mouth 3 (three) times daily as needed. May dissolve 1 tablet into a glass of water  and drink up to 3 times daily as needed. 30 tablet Stuart Vernell Norris, PA-C   pantoprazole  (PROTONIX ) 40 MG tablet Take 1 tablet (40 mg total) by mouth daily. 30 tablet Stuart Vernell Norris, [place]      PDMP not reviewed this encounter.   Stuart Vernell Norris, [place] 06/23/24 1815

## 2024-06-23 NOTE — ED Triage Notes (Signed)
 Pt reports acid reflux that feels like its sitting on her chest and in her throat x 1 day. Has had some reflux come up.

## 2024-06-25 ENCOUNTER — Emergency Department (HOSPITAL_COMMUNITY)
Admission: EM | Admit: 2024-06-25 | Discharge: 2024-06-26 | Disposition: A | Discharge status: Home / Self Care | Attending: Emergency Medicine | Admitting: Emergency Medicine

## 2024-06-25 ENCOUNTER — Encounter (HOSPITAL_COMMUNITY): Payer: Self-pay

## 2024-06-25 ENCOUNTER — Other Ambulatory Visit: Payer: Self-pay

## 2024-06-25 DIAGNOSIS — R079 Chest pain, unspecified: Secondary | ICD-10-CM | POA: Diagnosis not present

## 2024-06-25 DIAGNOSIS — I10 Essential (primary) hypertension: Secondary | ICD-10-CM | POA: Diagnosis not present

## 2024-06-25 DIAGNOSIS — Z79899 Other long term (current) drug therapy: Secondary | ICD-10-CM | POA: Diagnosis not present

## 2024-06-25 DIAGNOSIS — R072 Precordial pain: Secondary | ICD-10-CM | POA: Diagnosis not present

## 2024-06-25 NOTE — ED Triage Notes (Signed)
 Pt states that she started having chest pain since Thursday. Pt with hx of acid reflux in which she takes medication for. Pt seen at [facility] on Saturday for it and now states it feel like food is sitting in her throat and chest that is burning.

## 2024-06-26 ENCOUNTER — Emergency Department (HOSPITAL_COMMUNITY)

## 2024-06-26 DIAGNOSIS — R079 Chest pain, unspecified: Secondary | ICD-10-CM | POA: Diagnosis not present

## 2024-06-26 LAB — CBC
HCT: 38.3 % (ref 36.0–46.0)
Hemoglobin: 12.4 g/dL (ref 12.0–15.0)
MCH: 28.6 pg (ref 26.0–34.0)
MCHC: 32.4 g/dL (ref 30.0–36.0)
MCV: 88.2 fL (ref 80.0–100.0)
Platelets: 211 K/uL (ref 150–400)
RBC: 4.34 MIL/uL (ref 3.87–5.11)
RDW: 13.2 % (ref 11.5–15.5)
WBC: 7.6 K/uL (ref 4.0–10.5)
nRBC: 0 % (ref 0.0–0.2)

## 2024-06-26 LAB — BASIC METABOLIC PANEL WITH GFR
Anion gap: 9 (ref 5–15)
BUN: 15 mg/dL (ref 6–20)
CO2: 25 mmol/L (ref 22–32)
Calcium: 8.5 mg/dL — ABNORMAL LOW (ref 8.9–10.3)
Chloride: 104 mmol/L (ref 98–111)
Creatinine, Ser: 0.88 mg/dL (ref 0.44–1.00)
GFR, Estimated: >60 mL/min (ref >60)
Glucose, Bld: 105 mg/dL — ABNORMAL HIGH (ref 70–99)
Potassium: 3.3 mmol/L — ABNORMAL LOW (ref 3.5–5.1)
Sodium: 138 mmol/L (ref 135–145)

## 2024-06-26 LAB — TROPONIN I (HIGH SENSITIVITY): Troponin I (High Sensitivity): 2 ng/L (ref <18)

## 2024-06-26 MED ORDER — ALUM & MAG HYDROXIDE-SIMETH 200-200-20 MG/5ML PO SUSP
30.0000 mL | Freq: Once | ORAL | Status: AC
  Administered 2024-06-26: 30 mL via ORAL
  Filled 2024-06-26: qty 30

## 2024-06-26 MED ORDER — NITROGLYCERIN 0.4 MG SL SUBL
0.4000 mg | SUBLINGUAL_TABLET | SUBLINGUAL | Status: DC | PRN
Start: 2024-06-26 — End: 2024-06-26

## 2024-06-26 NOTE — ED Provider Notes (Signed)
 [place] EMERGENCY DEPARTMENT AT [facility] Provider Note   CSN: [id number] Arrival date & time: 06/25/24  2340     Patient presents with: chief complaint of chest pain  Gina Coleman is a 48 y.o. female.   The history is provided by the patient.  Patient with history of hypertension and hyperthyroidism presents with reflux and chest pain Patient reports around July 25 she had an episode of vomiting and burning in her throat in the morning.  The following day she started having chest pain and pressure.  She was seen at an urgent care and given a GI cocktail with some improvement.  Earlier in the day on July 27 around 3 PM she started having left-sided chain and pressure that has been persistent for well over 8 hours.  No shortness of breath, no vomiting, no diaphoresis. The pain does not radiate Denies any known history of CAD/CVA/PE Overall the heartburn is better, reports she still feels like there is something sitting in her throat. She is able to eat and drink without difficulty.     Past Medical History:  Diagnosis Date   Acid reflux    Anxiety    Headache    Hypertension    Hyperthyroidism     Prior to Admission medications   Medication Sig Start Date End Date Taking? Authorizing Provider  amLODipine  (NORVASC ) 5 MG tablet Take 1 tablet (5 mg total) by mouth daily. 05/10/24   Tobie Suzzane POUR, MD  escitalopram  (LEXAPRO ) 5 MG tablet Take 1 tablet (5 mg total) by mouth daily. 05/10/24   Tobie Suzzane POUR, MD  famotidine  (PEPCID ) 40 MG tablet TAKE 1 TABLET(40 MG) BY MOUTH DAILY IN THE EVENING 09/30/23   Tobie Suzzane POUR, MD  methimazole  (TAPAZOLE ) 5 MG tablet Take 1 tablet (5 mg total) by mouth daily. 05/10/24   Tobie Suzzane POUR, MD  Omega-3 Fatty Acids (FISH OIL) 1200 MG CAPS Take 1,200 mg by mouth daily.    [provider]  pantoprazole  (PROTONIX ) 40 MG tablet Take 1 tablet (40 mg total) by mouth daily. 06/23/24   Stuart Vernell Norris, PA-C  sucralfate   (CARAFATE ) 1 g tablet Take 1 tablet (1 g total) by mouth 3 (three) times daily as needed. May dissolve 1 tablet into a glass of water  and drink up to 3 times daily as needed. 06/23/24   Stuart Vernell Norris, PA-C  triamcinolone  cream (KENALOG ) 0.1 % Apply 1 application topically daily as needed (Eczema).    [provider]  UNABLE TO FIND Black seed oil-daily    [provider]  lisinopril (ZESTRIL) 5 MG tablet Take by mouth. 04/14/19 09/21/19  [provider]    Allergies: Other and Flexeril  [cyclobenzaprine ]    Review of Systems  Constitutional:  Negative for diaphoresis and fever.  Cardiovascular:  Positive for chest pain.  Gastrointestinal:  Positive for nausea.    Updated Vital Signs BP 131/87 (BP Location: Left Arm)   Temp 98.7 F (37.1 C) (Oral)   Resp 15   SpO2 97%   Physical Exam CONSTITUTIONAL: Well developed/well nourished HEAD: Normocephalic/atraumatic EYES: EOMI/PERRL ENMT: Mucous membranes moist, uvula midline, no stridor, no drooling NECK: supple no meningeal signs SPINE/BACK:entire spine nontender CV: S1/S2 noted, no murmurs/rubs/gallops noted LUNGS: Lungs are clear to auscultation bilaterally, no apparent distress ABDOMEN: soft, nontender, no rebound or guarding, bowel sounds noted throughout abdomen GU:no cva tenderness NEURO: Pt is awake/alert/appropriate, moves all extremitiesx4.  No facial droop.   EXTREMITIES: pulses normal/equalx4, full ROM,  no lower extremity edema or tenderness SKIN: warm, color normal PSYCH: no abnormalities of mood noted, alert and oriented to situation  (all labs ordered are listed, but only abnormal results are displayed) Labs Reviewed  BASIC METABOLIC PANEL WITH GFR - Abnormal; Notable for the following components:      Result Value   Potassium 3.3 (*)    Glucose, Bld 105 (*)    Calcium 8.5 (*)    All other components within normal limits  CBC  TROPONIN I (HIGH SENSITIVITY)    EKG: EKG  Interpretation Date/Time:  Monday June 26 2024 00:33:23 EDT Ventricular Rate:  89 PR Interval:  194 QRS Duration:  82 QT Interval:  361 QTC Calculation: 440 R Axis:   55  Text Interpretation: Sinus rhythm Anteroseptal infarct, age indeterminate Confirmed by Midge Golas ([id number]) on 06/26/2024 12:37:42 AM  Radiology: DG Chest Portable 1 View Result Date: 06/26/2024 CLINICAL DATA:  Chest pain for several days, initial encounter EXAM: PORTABLE CHEST 1 VIEW COMPARISON:  10/26/2022 FINDINGS: Cardiac shadow is within normal limits. Lungs are well aerated bilaterally. No focal infiltrate or effusion is seen. No bony abnormality is noted. IMPRESSION: No active disease. Electronically Signed   By: Oneil Devonshire M.D.   On: 06/26/2024 01:00     Procedures   Medications Ordered in the ED  alum & mag hydroxide-simeth (MAALOX/MYLANTA) 200-200-20 MG/5ML suspension 30 mL (30 mLs Oral Given 06/26/24 0032)    Clinical Course as of 06/26/24 0201  Mon Jun 26, 2024  0159 Patient presents with ongoing left-sided chest pain and pressure for up to 8 hours, EKG unremarkable, troponin negative.  Overall labs are reassuring.  Took Maalox with complete relief of her symptoms, did not receive nitroglycerin  or any other medicines.  Patient safe for discharge home.  We discussed strict ER return precautions  [DW]    Clinical Course User Index [DW] Midge Golas, MD             HEART Score: 2                    Medical Decision Making Amount and/or Complexity of Data Reviewed Labs: ordered. Radiology: ordered.  Risk OTC drugs. Prescription drug management.   This patient presents to the ED for concern of chest pain, this involves an extensive number of treatment options, and is a complaint that carries with it a high risk of complications and morbidity.  The differential diagnosis includes but is not limited to acute coronary syndrome, aortic dissection, pulmonary embolism, pericarditis, pneumothorax,  pneumonia, myocarditis, pleurisy, esophageal rupture    Comorbidities that complicate the patient evaluation: Patient's presentation is complicated by their history of hypertension  Social Determinants of Health: Patient's previous tobacco use  increases the complexity of managing their presentation  Additional history obtained: Records reviewed urgent care notes reviewed  Lab Tests: I Ordered, and personally interpreted labs.  The pertinent results include: Labs reassuring  Imaging Studies ordered: I ordered imaging studies including X-ray chest  I independently visualized and interpreted imaging which showed no acute findings I agree with the radiologist interpretation  Cardiac Monitoring: The patient was maintained on a cardiac monitor.  I personally viewed and interpreted the cardiac monitor which showed an underlying rhythm of:  sinus rhythm  Test Considered: Patient is low risk / negative by heart score, therefore do not feel that cardiac admission is indicated.  Reevaluation: After the interventions noted above, I reevaluated the patient and found that they have :improved  Complexity of problems addressed: Patient's presentation is most consistent with  acute presentation with potential threat to life or bodily function  Disposition: After consideration of the diagnostic results and the patient's response to treatment,  I feel that the patent would benefit from discharge  .        Final diagnoses:  Precordial pain    ED Discharge Orders     None          Midge Golas, MD 06/26/24 0202

## 2024-06-26 NOTE — Discharge Instructions (Signed)

## 2024-07-17 NOTE — Patient Instructions (Signed)
 Hyperthyroidism Hyperthyroidism refers to a thyroid  gland that is too active or overactive. The thyroid  gland is a small gland located in the lower front part of the neck, just in front of the windpipe (trachea). This gland makes hormones that: Help control how the body uses food for energy (metabolism). Help the heart and brain work well. Keep your bones strong. When the thyroid  is overactive, it produces too much of a hormone called thyroxine. What are the causes? This condition may be caused by: Graves' disease. This is a disorder in which the body's disease-fighting system (immune system) attacks the thyroid  gland. This is the most common cause. Inflammation of the thyroid  gland. A tumor in the thyroid  gland. Use of certain medicines, including: Prescription thyroid  hormone replacement. Herbal supplements that mimic thyroid  hormones. Amiodarone therapy. Solid or fluid-filled lumps within your thyroid  gland (thyroid  nodules). Taking in a large amount of iodine from foods or medicines. What increases the risk? You are more likely to develop this condition if: You are female. You have a family history of thyroid  conditions. You smoke tobacco. You use a medicine called lithium. You take medicines that affect the immune system (immunosuppressants). What are the signs or symptoms? Symptoms of this condition include: Nervousness. Inability to tolerate heat. Diarrhea. Rapid heart rate. Shaky hands. Restlessness. Sleep problems. Other symptoms may include: Heart skipping beats or making extra beats. Unexplained weight loss. Change in the texture of hair or skin. Loss of menstruation. Fatigue. Enlarged thyroid  gland or a lump in the thyroid  (nodule). You may also have symptoms of Graves' disease, which may include: Protruding eyes. Dry eyes. Red or swollen eyes. Problems with vision. How is this diagnosed? This condition may be diagnosed based on: Your symptoms and medical  history. A physical exam. Blood tests. Thyroid  ultrasound. This test involves using sound waves to produce images of the thyroid  gland. A thyroid  scan. A radioactive substance is injected into a vein, and images show how much iodine is present in the thyroid . Radioactive iodine uptake test (RAIU). A small amount of radioactive iodine is given by mouth to see how much iodine the thyroid  absorbs after a certain amount of time. How is this treated? Treatment depends on the cause and severity of the condition. Treatment may include: Medicines to reduce the amount of thyroid  hormone your body makes. Medicines to help manage your symptoms. Radioactive iodine treatment (radioiodine therapy). This involves swallowing a small dose of radioactive iodine, in capsule or liquid form, to kill thyroid  cells. Surgery to remove part or all of your thyroid  gland. You may need to take thyroid  hormone replacement medicine for the rest of your life after thyroid  surgery. Follow these instructions at home:  Take over-the-counter and prescription medicines only as told by your health care provider. Do not use any products that contain nicotine or tobacco. These products include cigarettes, chewing tobacco, and vaping devices, such as e-cigarettes. If you need help quitting, ask your health care provider. Follow any instructions from your health care provider about diet. You may be instructed to limit foods that contain iodine. Keep all follow-up visits. You will need to have blood tests regularly so that your health care provider can monitor your condition. Where to find more information [employer] and [employer]estive [employer] Kid[employer] Diseases: [email or web address] Contact a health care provider if: Your symptoms do not get better with treatment. You have a fever. You have abdominal pain. You feel dizzy. You are taking thyroid  hormone replacement medicine and: You have  symptoms of depression. You feel like you  are tired all the time. You gain weight. Get help right away if: You have sudden, unexplained confusion or other mental changes. You have chest pain. You have fast or irregular heartbeats (palpitations). You have difficulty breathing. These symptoms may be an emergency. Get help right away. Call 911. Do not wait to see if the symptoms will go away. Do not drive yourself to the hospital. Summary The thyroid  gland is a small gland located in the lower front part of the neck, just in front of the windpipe. Hyperthyroidism is when the thyroid  gland is too active and produces too much of a hormone called thyroxine. The most common cause is Graves' disease, a disorder in which your immune system attacks the thyroid  gland. Hyperthyroidism can cause various symptoms, such as unexplained weight loss, nervousness, inability to tolerate heat, or changes in your heartbeat. Treatment may include medicine to reduce the amount of thyroid  hormone your body makes, radioiodine therapy, surgery, or medicines to manage symptoms. This information is not intended to replace advice given to you by your health care provider. Make sure you discuss any questions you have with your health care provider. Document Revised: 01/09/2022 Document Reviewed: 01/09/2022 Elsevier Patient Education  2024 [employer].

## 2024-07-18 ENCOUNTER — Ambulatory Visit (INDEPENDENT_AMBULATORY_CARE_PROVIDER_SITE_OTHER): Admitting: Nurse Practitioner

## 2024-07-18 ENCOUNTER — Encounter: Payer: Self-pay | Admitting: Nurse Practitioner

## 2024-07-18 ENCOUNTER — Other Ambulatory Visit: Payer: Self-pay | Admitting: Internal Medicine

## 2024-07-18 VITALS — BP 124/74 | HR 78 | Wt 246.6 lb

## 2024-07-18 DIAGNOSIS — E042 Nontoxic multinodular goiter: Secondary | ICD-10-CM

## 2024-07-18 DIAGNOSIS — E0590 Thyrotoxicosis, unspecified without thyrotoxic crisis or storm: Secondary | ICD-10-CM

## 2024-07-18 NOTE — Progress Notes (Signed)
 07/18/2024     Endocrinology Consult Note    Subjective:    Patient ID: Gina Coleman, female    DOB: 03/20/1976, PCP Gina Suzzane POUR, MD.   Past Medical History:  Diagnosis Date   Acid reflux    Anxiety    Headache    Hypertension    Hyperthyroidism     Past Surgical History:  Procedure Laterality Date   BIOPSY  06/05/2021   Procedure: BIOPSY;  Surgeon: Gina Claudis PENNER, MD;  Location: [facility];  Service: Endoscopy;;   COLONOSCOPY N/A 06/05/2021   rehman:The examined portion of the ileum was normal. one 4mm polyp in cecum (tubular adenoma). external hemorrhoids   ESOPHAGOGASTRODUODENOSCOPY N/A 06/05/2021   Rehman:Normal hypopharynx.normal esophagus, z line irregular 36 cm from incisors, gastritics with reactive gastropathy, normal duodenal bulb and second portion of duodenum   FOOT SURGERY Right    LEG SURGERY Right    TRANSFORAMINAL LUMBAR INTERBODY FUSION W/ MIS 1 LEVEL N/A 12/08/2021   Procedure: Lumbar Three-Four Minimally Invasive Transforaminal Lumbar Interbody Fusion;  Surgeon: Gina Debby LABOR, MD;  Location: [facility];  Service: Neurosurgery;  Laterality: N/A;  Lumbar Three-Four Minimally Invasive Transforaminal Lumbar Interbody Fusion   TUBAL LIGATION      Social History   Socioeconomic History   Marital status: Married    Spouse name: Not on file   Number of children: Not on file   Years of education: Not on file   Highest education level: Not on file  Occupational History   Not on file  Tobacco Use   Smoking status: Former    Types: Cigarettes   Smokeless tobacco: Never  Vaping Use   Vaping status: Never Used  Substance and Sexual Activity   Alcohol use: Yes    Alcohol/week: 3.0 standard drinks of alcohol    Types: 3 Glasses of wine per week    Comment: social    Drug use: Never   Sexual activity: Yes    Birth control/protection: Surgical    Comment: tubal  Other Topics Concern   Not on file  Social History Narrative   Not on  file   Social Drivers of Health   Financial Resource Strain: Low Risk  (09/20/2023)   Overall Financial Resource Strain (CARDIA)    Difficulty of Paying Living Expenses: Not hard at all  Food Insecurity: No Food Insecurity (09/20/2023)   Hunger Vital Sign    Worried About Running Out of Food in the Last Year: Never true    Ran Out of Food in the Last Year: Never true  Transportation Needs: No Transportation Needs (09/20/2023)   PRAPARE - Administrator, Civil Service (Medical): No    Lack of Transportation (Non-Medical): No  Physical Activity: Inactive (09/20/2023)   Exercise Vital Sign    Days of Exercise per Week: 0 days    Minutes of Exercise per Session: 10 min  Stress: No Stress Concern Present (09/20/2023)   [employer] of Occupational Health - Occupational Stress Questionnaire    Feeling of Stress : Only a little  Social Connections: Socially Integrated (09/20/2023)   Social Connection and Isolation Panel    Frequency of Communication with Friends and Family: More than three times a week    Frequency of Social Gatherings with Friends and Family: Once a week    Attends Religious Services: More than 4 times per year    Active Member of [employer] or Organizations: Yes    Attends [employer]  or Organization Meetings: Never    Marital Status: Married    Family History  Problem Relation Age of Onset   Breast cancer Maternal Grandmother    CAD Mother        Premature disease   Hypertension Sister    HIV Sister    Asthma Son    Diabetes Son        Type 1    Outpatient Encounter Medications as of 07/18/2024  Medication Sig   amLODipine  (NORVASC ) 5 MG tablet Take 1 tablet (5 mg total) by mouth daily.   escitalopram  (LEXAPRO ) 5 MG tablet Take 1 tablet (5 mg total) by mouth daily.   famotidine  (PEPCID ) 40 MG tablet TAKE 1 TABLET(40 MG) BY MOUTH DAILY IN THE EVENING   Omega-3 Fatty Acids (FISH OIL) 1200 MG CAPS Take 1,200 mg by mouth daily.   pantoprazole  (PROTONIX )  40 MG tablet Take 1 tablet (40 mg total) by mouth daily.   sucralfate  (CARAFATE ) 1 g tablet Take 1 tablet (1 g total) by mouth 3 (three) times daily as needed. May dissolve 1 tablet into a glass of water  and drink up to 3 times daily as needed.   triamcinolone  cream (KENALOG ) 0.1 % Apply 1 application topically daily as needed (Eczema).   UNABLE TO FIND Black seed oil-daily   [DISCONTINUED] methimazole  (TAPAZOLE ) 5 MG tablet TAKE 1 TABLET(5 MG) BY MOUTH DAILY   [DISCONTINUED] lisinopril (ZESTRIL) 5 MG tablet Take by mouth.   [DISCONTINUED] methimazole  (TAPAZOLE ) 5 MG tablet Take 1 tablet (5 mg total) by mouth daily.   No facility-administered encounter medications on file as of 07/18/2024.    ALLERGIES: Allergies  Allergen Reactions   Other Rash     GLOVE POWDER     Flexeril  [Cyclobenzaprine ] Palpitations    Happened with 5 mg dose    VACCINATION STATUS: Immunization History  Administered Date(s) Administered   Moderna Sars-Covid-2 Vaccination 06/10/2020, 07/08/2020   PPD Test 07/04/2018, 07/04/2018, 10/11/2023   Tdap 05/20/2022     HPI  Gina Coleman is 48 y.o. female who presents today with a medical history as above. she is being seen in consultation for hyperthyroidism and multinodular goiter requested by Gina Suzzane POUR, MD.  She was previously managed by Gina Coleman but wanted somewhere closer to home.  She was first diagnosed with thyroid  issues in 2020.  She has had biopsies on 2 nodules in the past, both favoring benignity.  she has been dealing with symptoms of weight gain, joint aches, anxiety, insomnia.   she denies dysphagia, choking, shortness of breath, no recent voice change.    she denies any known family history of thyroid  dysfunction but she notes she really doesn't know her family history due to being in foster care. She is currently on Methimazole  5 mg po daily, started this in 2020.  She does take daily womens MVI (with Biotin in it).   Review of  systems  Constitutional: +increasing body weight, current Body mass index is 42.33 kg/m., no fatigue, no subjective hyperthermia, no subjective hypothermia Eyes: no blurry vision, no xerophthalmia ENT: no sore throat, no nodules palpated in throat, no dysphagia/odynophagia, no hoarseness Cardiovascular: no chest pain, no shortness of breath, no palpitations, no leg swelling Respiratory: no cough, no shortness of breath Gastrointestinal: no nausea/vomiting/diarrhea Musculoskeletal: + diffuse muscle/joint aches Skin: no rashes, no hyperemia Neurological: no tremors, no numbness, no tingling, no dizziness Psychiatric: no depression, + anxiety   Objective:    BP 124/74 (BP Location: Left Arm,  Patient Position: Sitting, Cuff Size: Large)   Pulse 78   Wt 246 lb 9.6 oz (111.9 kg)   BMI 42.33 kg/m   Wt Readings from Last 3 Encounters:  07/18/24 246 lb 9.6 oz (111.9 kg)  05/10/24 241 lb 3.2 oz (109.4 kg)  04/04/24 242 lb 8 oz (110 kg)     BP Readings from Last 3 Encounters:  07/18/24 124/74  06/26/24 128/79  06/23/24 137/83                          Physical Exam- Limited  Constitutional:  Body mass index is 42.33 kg/m. , not in acute distress, mildly anxious state of mind Eyes:  EOMI, no exophthalmos Neck: Supple Thyroid : ++ gross goiter Cardiovascular: RRR, no murmurs, rubs, or gallops, no edema Respiratory: Adequate breathing efforts, no crackles, rales, rhonchi, or wheezing Musculoskeletal: no gross deformities, strength intact in all four extremities, no gross restriction of joint movements Skin:  no rashes, no hyperemia Neurological: no tremor with outstretched hands, DTR normal in BLE   CMP     Component Value Date/Time   NA 138 06/25/2024 2355   NA 139 09/21/2023 1515   K 3.3 (L) 06/25/2024 2355   CL 104 06/25/2024 2355   CO2 25 06/25/2024 2355   GLUCOSE 105 (H) 06/25/2024 2355   BUN 15 06/25/2024 2355   BUN 12 09/21/2023 1515   CREATININE 0.88 06/25/2024  2355   CREATININE 0.61 08/27/2020 1142   CALCIUM 8.5 (L) 06/25/2024 2355   PROT 7.1 09/21/2023 1515   ALBUMIN 4.0 09/21/2023 1515   AST 17 09/21/2023 1515   ALT 15 09/21/2023 1515   ALKPHOS 138 (H) 09/21/2023 1515   BILITOT 0.3 09/21/2023 1515   GFR 69.56 05/13/2020 1535   EGFR 103 09/21/2023 1515   GFRNONAA >60 06/25/2024 2355   GFRNONAA 109 08/27/2020 1142     CBC    Component Value Date/Time   WBC 7.6 06/25/2024 2355   RBC 4.34 06/25/2024 2355   HGB 12.4 06/25/2024 2355   HGB 12.4 09/21/2023 1515   HCT 38.3 06/25/2024 2355   HCT 38.4 09/21/2023 1515   PLT 211 06/25/2024 2355   PLT 227 09/21/2023 1515   MCV 88.2 06/25/2024 2355   MCV 91 09/21/2023 1515   MCH 28.6 06/25/2024 2355   MCHC 32.4 06/25/2024 2355   RDW 13.2 06/25/2024 2355   RDW 12.2 09/21/2023 1515   LYMPHSABS 1.9 09/21/2023 1515   MONOABS 0.6 07/16/2021 0333   EOSABS 0.3 09/21/2023 1515   BASOSABS 0.0 09/21/2023 1515     Diabetic Labs (most recent): Lab Results  Component Value Date   HGBA1C 5.9 (H) 09/21/2023   HGBA1C 5.8 (H) 09/28/2022    Lipid Panel     Component Value Date/Time   CHOL 185 09/21/2023 1515   TRIG 114 09/21/2023 1515   HDL 57 09/21/2023 1515   CHOLHDL 3.2 09/21/2023 1515   CHOLHDL 3.4 10/30/2019 1104   LDLCALC 108 (H) 09/21/2023 1515   LDLCALC 126 (H) 10/30/2019 1104   LABVLDL 20 09/21/2023 1515     Lab Results  Component Value Date   TSH 1.460 09/21/2023   TSH 1.26 12/21/2022   TSH 1.23 02/11/2022   TSH 1.91 07/18/2021   TSH 1.397 10/31/2020   TSH 0.98 07/22/2020   TSH 0.13 (L) 05/13/2020   TSH 0.06 (L) 04/03/2020   TSH 0.14 (L) 11/14/2019   TSH 0.14 (L) 09/07/2019   FREET4 0.98  09/21/2023   FREET4 0.61 12/21/2022   FREET4 0.79 02/11/2022   FREET4 0.69 07/18/2021   FREET4 0.72 07/22/2020   FREET4 0.87 05/13/2020   FREET4 0.98 11/14/2019   FREET4 1.11 09/07/2019   FREET4 1.07 07/11/2019    Thyroid  US  from 01/12/23 CLINICAL DATA:  S/P benign FNA in 2020  and 2023. Please evaluate   EXAM: THYROID  ULTRASOUND   TECHNIQUE: Ultrasound examination of the thyroid  gland and adjacent soft tissues was performed.   COMPARISON:  06/27/2019   08/07/2020   12/24/2020   02/05/2022   02/05/2022   FINDINGS: Parenchymal Echotexture: Mildly heterogenous   Isthmus: 0.7 cm   Right lobe: 8.4 x 3.1 x 3.4 cm   Left lobe: 7.5 x 2.6 x 3.2 cm   _________________________________________________________   Estimated total number of nodules >/= 1 cm: 6   Number of spongiform nodules >/=  2 cm not described below (TR1): 0   Number of mixed cystic and solid nodules >/= 1.5 cm not described below (TR2): 0   _________________________________________________________   Nodule 1: Previously biopsied isthmus nodule is not significantly changed in size currently measuring 1.9 x 1.1 x 1 x 1.8 cm. Please correlate with prior FNA results from 02/25/2022.   _________________________________________________________   Nodule 2: 4.4 x 1.4 x 3 x 1 cm solid hypoechoic right superior thyroid  nodule is unchanged in size since prior examination. Please correlate with prior FNA results from 08/02/2019.   _________________________________________________________   Nodule 3: 1.0 x 0.9 x 0.6 cm isoechoic solid isthmus nodule (TI-RADS 3) does not meet criteria for imaging surveillance or FNA.   _________________________________________________________   Nodule 4: 1.1 x 1.0 x 1.0 cm solid isoechoic right inferior thyroid  nodule (TI-RADS 3) is not significantly changed in size since 12/24/2020 and does not meet criteria for imaging surveillance or FNA.   _________________________________________________________   Nodule 5: 1.4 x 1.2 x 0.9 cm solid isoechoic right inferior thyroid  nodule (TI-RADS 3) is not significantly changed in size since 12/24/2020 and does not meet criteria for imaging surveillance or FNA.    _________________________________________________________   Nodule 6: 1.5 x 1.0 x 1.5 cm solid isoechoic left superior thyroid  nodule (TI-RADS 3) is not significantly changed in size since 12/24/2020 and does not meet criteria for imaging surveillance or FNA.   IMPRESSION: 1. Previously biopsied isthmus and right thyroid  nodules are not significantly changed in size. Please correlate with prior FNA results from 02/25/2022 and 08/02/2019. 2. Remaining bilateral thyroid  nodules are not significantly changed in size since 12/24/2020.   The above is in keeping with the ACR TI-RADS recommendations - J Am Coll Radiol 2017;14:587-595.     Electronically Signed   By: Aliene Lloyd M.D.   On: 01/13/2023 11:47    Assessment & Plan:   1. Multinodular goiter (Primary) 2. Subclinical hyperthyroidism  she is being seen at a kind request of Gina Suzzane POUR, MD.  her history and most recent labs are reviewed, and she was examined clinically. Subjective and objective findings are consistent with thyrotoxicosis likely from primary hyperthyroidism. The potential risks of untreated thyrotoxicosis and the need for definitive therapy have been discussed in detail with her, and she agrees to proceed with diagnostic workup and treatment plan.   I will repeat full profile thyroid  function tests today (including antibody testing to assess for autoimmune thyroid  dysfunction).  She does have son recently diagnosed with type 1 diabetes.   Patient has been on Methimazole  for 5 years.  Last labs from October  of 2024 shows effective treatment, slightly on the hypoactive side.  Will stop the medication for now and repeat labs in about 6 weeks to see.  Her Alk phos level was slightly elevated at that same time.  We did talk about possible side effects of long term use of this medication.  If labs revert back to over-active, may look at more imaging studies (NM uptake and scan) and permanent ways of treating it  with either surgery or RAI ablation.  Will order repeat ultrasound to follow up on MNG.  Last ultrasound in 12/2022 shows moderately enlarged gland and multiple nodules.  She did have biopsies on 2 of the suspicious nodules over the years, favoring benignity.  If her thyroid  has grown once again, surgery may be the best route, even though she does not appear to have compressive symptoms at this time.    -Patient is advised to maintain close follow up with Gina Suzzane POUR, MD for primary care needs.   - Time spent with the patient: 60 minutes, which was spent in obtaining information about her symptoms, reviewing her previous labs, evaluations, and treatments, counseling her about her hyperthyroidism , and developing a plan to confirm the diagnosis and long term treatment as necessary. Please refer to Patient Self Inventory in the Media tab for reviewed elements of pertinent patient history.  Gina Coleman participated in the discussions, expressed understanding, and voiced agreement with the above plans.  All questions were answered to her satisfaction. she is encouraged to contact clinic should she have any questions or concerns prior to her return visit.   Follow up plan: Return in about 8 weeks (around 09/12/2024) for Thyroid  follow up, Previsit labs, thyroid  ultrasound.   Thank you for involving me in the care of this pleasant patient, and I will continue to update you with her progress.    Benton Rio, [facility] [address] [place], [place] [address] Phone: [phone] Fax: [phone]  07/18/2024, 2:45 PM

## 2024-07-24 ENCOUNTER — Ambulatory Visit (HOSPITAL_COMMUNITY)

## 2024-07-24 ENCOUNTER — Ambulatory Visit (HOSPITAL_COMMUNITY)
Admission: RE | Admit: 2024-07-24 | Discharge: 2024-07-24 | Disposition: A | Discharge status: Home / Self Care | Source: Ambulatory Visit | Attending: Nurse Practitioner | Admitting: Nurse Practitioner

## 2024-07-24 ENCOUNTER — Encounter (HOSPITAL_COMMUNITY): Payer: Self-pay

## 2024-07-24 ENCOUNTER — Ambulatory Visit (HOSPITAL_COMMUNITY)
Admission: RE | Admit: 2024-07-24 | Discharge: 2024-07-24 | Disposition: A | Discharge status: Home / Self Care | Source: Ambulatory Visit | Attending: Adult Health | Admitting: Adult Health

## 2024-07-24 DIAGNOSIS — E042 Nontoxic multinodular goiter: Secondary | ICD-10-CM | POA: Insufficient documentation

## 2024-07-24 DIAGNOSIS — E0590 Thyrotoxicosis, unspecified without thyrotoxic crisis or storm: Secondary | ICD-10-CM | POA: Diagnosis not present

## 2024-07-24 DIAGNOSIS — Z1231 Encounter for screening mammogram for malignant neoplasm of breast: Secondary | ICD-10-CM | POA: Diagnosis not present

## 2024-07-25 ENCOUNTER — Ambulatory Visit (HOSPITAL_COMMUNITY)

## 2024-07-27 ENCOUNTER — Ambulatory Visit: Payer: Self-pay | Admitting: Adult Health

## 2024-08-12 ENCOUNTER — Other Ambulatory Visit: Payer: Self-pay | Admitting: Internal Medicine

## 2024-08-15 ENCOUNTER — Other Ambulatory Visit: Payer: Self-pay | Admitting: Internal Medicine

## 2024-08-15 DIAGNOSIS — I10 Essential (primary) hypertension: Secondary | ICD-10-CM

## 2024-08-21 ENCOUNTER — Telehealth: Payer: Self-pay | Admitting: Adult Health

## 2024-08-21 MED ORDER — IBUPROFEN 800 MG PO TABS
800.0000 mg | ORAL_TABLET | Freq: Three times a day (TID) | ORAL | 1 refills | Status: AC | PRN
Start: 2024-08-21 — End: ?

## 2024-08-21 NOTE — Telephone Encounter (Signed)
 Refilled ibuprofen

## 2024-08-21 NOTE — Telephone Encounter (Signed)
 Patient would like a refill on her ibuprofen  800 mg. Please advise.

## 2024-08-21 NOTE — Addendum Note (Signed)
 Addended by: Kealohilani Maiorino A on: 08/21/2024 04:51 PM   Modules accepted: Orders

## 2024-08-29 ENCOUNTER — Encounter: Payer: Self-pay | Admitting: Adult Health

## 2024-08-29 ENCOUNTER — Ambulatory Visit (INDEPENDENT_AMBULATORY_CARE_PROVIDER_SITE_OTHER): Admitting: Adult Health

## 2024-08-29 VITALS — BP 138/89 | HR 80 | Ht 64.0 in | Wt 247.0 lb

## 2024-08-29 DIAGNOSIS — D219 Benign neoplasm of connective and other soft tissue, unspecified: Secondary | ICD-10-CM

## 2024-08-29 DIAGNOSIS — N938 Other specified abnormal uterine and vaginal bleeding: Secondary | ICD-10-CM

## 2024-08-29 DIAGNOSIS — N946 Dysmenorrhea, unspecified: Secondary | ICD-10-CM

## 2024-08-29 DIAGNOSIS — R102 Pelvic and perineal pain unspecified side: Secondary | ICD-10-CM | POA: Insufficient documentation

## 2024-08-29 MED ORDER — NORETHINDRONE ACETATE 5 MG PO TABS: 10.0000 mg | ORAL_TABLET | Freq: Every day | ORAL | 0 refills | Status: DC

## 2024-08-29 NOTE — Progress Notes (Signed)
  Subjective:     Patient ID: Gina Coleman, female   DOB: 1976/01/28, 48 y.o.   MRN: [id number]  HPI Gina Coleman is a 48 year old black female, married, [id number] in complaining of bleeding since 08/12/24, it can be heavy at times with clots and cramps. When heavy will change pad every 2 hours. She has left side pain.  She has pelvic US  04/19/24 has 2.3 x 2.2 x 1.3 cm intramural fibroid, normal ovaries. She also says she was taken off thyroid  meds 07/18/24 and has follow up with labs 09/19/24.    Component Value Date/Time   DIAGPAP  06/22/2023 1548    - Negative for intraepithelial lesion or malignancy (NILM)   DIAGPAP  06/14/2020 1537    - Negative for intraepithelial lesion or malignancy (NILM)   HPVHIGH Negative 06/22/2023 1548   HPVHIGH Negative 06/14/2020 1537   ADEQPAP  06/22/2023 1548    Satisfactory for evaluation; transformation zone component PRESENT.   ADEQPAP  06/14/2020 1537    Satisfactory for evaluation; transformation zone component PRESENT.   PCP is Dr Tobie   Review of Systems  +bleeding since 08/12/24, it can be heavy at times with clots and cramps. When heavy will change pad every 2 hours. She has left side pain.   Reviewed past medical,surgical, social and family history. Reviewed medications and allergies.  Objective:   Physical Exam BP 138/89 (BP Location: Right Arm, Patient Position: Sitting, Cuff Size: Normal)   Pulse 80   Ht 5' 4 (1.626 m)   Wt 247 lb (112 kg)   LMP 08/12/2024   BMI 42.40 kg/m   Skin warm and dry.Pelvic: external genitalia is normal in appearance no lesions, vagina:+blood,urethra has no lesions or masses noted, cervix:smooth and bulbous, uterus: normal size, shape and contour, non tender, no masses felt, adnexa: no masses, + tenderness noted left side just above waist. Bladder is non tender and no masses felt. Fall risk is low  Upstream - 08/29/24 1605       Pregnancy Intention Screening   Does the patient want to become pregnant in the next  year? N/A    Does the patient's partner want to become pregnant in the next year? N/A    Would the patient like to discuss contraceptive options today? N/A      Contraception Wrap Up   Current Method Female Sterilization    End Method Female Sterilization    Contraception Counseling Provided No    How was the end contraceptive method provided? N/A            Examination chaperoned by Tish RN  Assessment:     1. DUB (dysfunctional uterine bleeding) (Primary) +bleeding since 08/12/24, it can be heavy at times with clots and cramps. When heavy will change pad every 2 hours Will rx aygestin  5 mg tablets take 2 daily to try to stop bleeding Meds ordered this encounter  Medications   norethindrone  (AYGESTIN ) 5 MG tablet    Sig: Take 2 tablets (10 mg total) by mouth daily.    Dispense:  10 tablet    Refill:  0    Supervising Provider:   JAYNE MINDER H [[id number]]     2. Pelvic pain Pain left side just above waist  3. Fibroid Has known fibroid   2.3 x 2.2 x 1.3 cm intramural fibroid,  4. Dysmenorrhea Has period cramps    Plan:     Follow up 09/07/24 for ROS

## 2024-09-01 ENCOUNTER — Other Ambulatory Visit: Payer: Self-pay | Admitting: Adult Health

## 2024-09-07 ENCOUNTER — Encounter: Payer: Self-pay | Admitting: Adult Health

## 2024-09-07 ENCOUNTER — Ambulatory Visit: Admitting: Adult Health

## 2024-09-07 VITALS — BP 144/85 | HR 79 | Ht 64.0 in | Wt 247.0 lb

## 2024-09-07 DIAGNOSIS — N938 Other specified abnormal uterine and vaginal bleeding: Secondary | ICD-10-CM

## 2024-09-07 DIAGNOSIS — R109 Unspecified abdominal pain: Secondary | ICD-10-CM

## 2024-09-07 DIAGNOSIS — I10 Essential (primary) hypertension: Secondary | ICD-10-CM | POA: Diagnosis not present

## 2024-09-07 NOTE — Progress Notes (Signed)
  Subjective:     Patient ID: Gina Coleman, female   DOB: Jan 05, 1976, 47 y.o.   MRN: [id number]  HPI Mayerly is a 48 year old black female, married, [id number] back in follow up on bleeding and it stopped with Aygestin . Has pain in left side above waist, it goes and comes.     Component Value Date/Time   DIAGPAP  06/22/2023 1548    - Negative for intraepithelial lesion or malignancy (NILM)   DIAGPAP  06/14/2020 1537    - Negative for intraepithelial lesion or malignancy (NILM)   HPVHIGH Negative 06/22/2023 1548   HPVHIGH Negative 06/14/2020 1537   ADEQPAP  06/22/2023 1548    Satisfactory for evaluation; transformation zone component PRESENT.   ADEQPAP  06/14/2020 1537    Satisfactory for evaluation; transformation zone component PRESENT.   PCP is Dr Tobie.  Review of Systems Bleeding stopped after about 4 days of Aygestin   Still has pain in left side above waist, it goes and comes Reviewed past medical,surgical, social and family history. Reviewed medications and allergies.     Objective:   Physical Exam BP (!) 144/85 (BP Location: Right Arm, Patient Position: Sitting, Cuff Size: Large)   Pulse 79   Ht 5' 4 (1.626 m)   Wt 247 lb (112 kg)   LMP 08/12/2024 (Exact Date)   BMI 42.40 kg/m     Skin warm and dry. Lungs: clear to ausculation bilaterally. Cardiovascular: regular rate and rhythm. Has point tenderness left side above waist and twist with out pain.  Upstream - 09/07/24 1520       Pregnancy Intention Screening   Does the patient want to become pregnant in the next year? No    Does the patient's partner want to become pregnant in the next year? No    Would the patient like to discuss contraceptive options today? No      Contraception Wrap Up   Current Method Female Sterilization    End Method Female Sterilization    Contraception Counseling Provided No           Assessment:     1. DUB (dysfunctional uterine bleeding) (Primary) Bleeding resolved after about  4 days of aygestin  10 mg daily  2. Side pain Has point tenderness left side, above waist, she uses heat and says massage helps The pain comes and goes  Follow up with PCP    3. Hypertension Did not take BP meds today Take meds daily and follow up with PCP  Plan:     Return 10/23/24 as scheduled for physical

## 2024-09-12 LAB — T4, FREE: Free T4: 1.06 ng/dL (ref 0.82–1.77)

## 2024-09-12 LAB — THYROID PEROXIDASE ANTIBODY: Thyroperoxidase Ab SerPl-aCnc: 13 [IU]/mL (ref 0–34)

## 2024-09-12 LAB — T3, FREE: T3, Free: 3 pg/mL (ref 2.0–4.4)

## 2024-09-12 LAB — THYROGLOBULIN ANTIBODY: Thyroglobulin Antibody: <1 [IU]/mL (ref 0.0–0.9)

## 2024-09-12 LAB — TSH: TSH: 0.44 u[IU]/mL — ABNORMAL LOW (ref 0.450–4.500)

## 2024-09-19 ENCOUNTER — Ambulatory Visit: Admitting: Nurse Practitioner

## 2024-09-19 DIAGNOSIS — E042 Nontoxic multinodular goiter: Secondary | ICD-10-CM

## 2024-09-19 DIAGNOSIS — E0590 Thyrotoxicosis, unspecified without thyrotoxic crisis or storm: Secondary | ICD-10-CM

## 2024-09-26 ENCOUNTER — Ambulatory Visit: Admitting: Nurse Practitioner

## 2024-09-26 ENCOUNTER — Encounter: Payer: Self-pay | Admitting: Nurse Practitioner

## 2024-09-26 VITALS — BP 124/76 | HR 84 | Ht 64.0 in | Wt 248.8 lb

## 2024-09-26 DIAGNOSIS — E042 Nontoxic multinodular goiter: Secondary | ICD-10-CM | POA: Diagnosis not present

## 2024-09-26 DIAGNOSIS — E0590 Thyrotoxicosis, unspecified without thyrotoxic crisis or storm: Secondary | ICD-10-CM

## 2024-09-26 NOTE — Progress Notes (Signed)
 09/26/2024     Endocrinology Consult Note    Subjective:    Patient ID: Gina Coleman, female    DOB: 12-28-1975, PCP Tobie Suzzane POUR, MD.   Past Medical History:  Diagnosis Date   Acid reflux    Anxiety    Headache    Hypertension    Hyperthyroidism     Past Surgical History:  Procedure Laterality Date   BIOPSY  06/05/2021   Procedure: BIOPSY;  Surgeon: Golda Claudis PENNER, MD;  Location: [facility];  Service: Endoscopy;;   COLONOSCOPY N/A 06/05/2021   rehman:The examined portion of the ileum was normal. one 4mm polyp in cecum (tubular adenoma). external hemorrhoids   ESOPHAGOGASTRODUODENOSCOPY N/A 06/05/2021   Rehman:Normal hypopharynx.normal esophagus, z line irregular 36 cm from incisors, gastritics with reactive gastropathy, normal duodenal bulb and second portion of duodenum   FOOT SURGERY Right    LEG SURGERY Right    TRANSFORAMINAL LUMBAR INTERBODY FUSION W/ MIS 1 LEVEL N/A 12/08/2021   Procedure: Lumbar Three-Four Minimally Invasive Transforaminal Lumbar Interbody Fusion;  Surgeon: Cheryle Debby LABOR, MD;  Location: [facility];  Service: Neurosurgery;  Laterality: N/A;  Lumbar Three-Four Minimally Invasive Transforaminal Lumbar Interbody Fusion   TUBAL LIGATION      Social History   Socioeconomic History   Marital status: Married    Spouse name: Not on file   Number of children: Not on file   Years of education: Not on file   Highest education level: Not on file  Occupational History   Not on file  Tobacco Use   Smoking status: Former    Types: Cigarettes   Smokeless tobacco: Never  Vaping Use   Vaping status: Never Used  Substance and Sexual Activity   Alcohol use: Yes    Alcohol/week: 3.0 standard drinks of alcohol    Types: 3 Glasses of wine per week    Comment: social    Drug use: Never   Sexual activity: Yes    Birth control/protection: Surgical    Comment: tubal  Other Topics Concern   Not on file  Social History Narrative   Not on  file   Social Drivers of Health   Financial Resource Strain: Low Risk  (09/20/2023)   Overall Financial Resource Strain (CARDIA)    Difficulty of Paying Living Expenses: Not hard at all  Food Insecurity: No Food Insecurity (09/20/2023)   Hunger Vital Sign    Worried About Running Out of Food in the Last Year: Never true    Ran Out of Food in the Last Year: Never true  Transportation Needs: No Transportation Needs (09/20/2023)   PRAPARE - Administrator, Civil Service (Medical): No    Lack of Transportation (Non-Medical): No  Physical Activity: Inactive (09/20/2023)   Exercise Vital Sign    Days of Exercise per Week: 0 days    Minutes of Exercise per Session: 10 min  Stress: No Stress Concern Present (09/20/2023)   [employer] of Occupational Health - Occupational Stress Questionnaire    Feeling of Stress : Only a little  Social Connections: Socially Integrated (09/20/2023)   Social Connection and Isolation Panel    Frequency of Communication with Friends and Family: More than three times a week    Frequency of Social Gatherings with Friends and Family: Once a week    Attends Religious Services: More than 4 times per year    Active Member of [employer] or Organizations: Yes    Attends [employer]  or Organization Meetings: Never    Marital Status: Married    Family History  Problem Relation Age of Onset   Breast cancer Maternal Grandmother    CAD Mother        Premature disease   Hypertension Sister    HIV Sister    Asthma Son    Diabetes Son        Type 1    Outpatient Encounter Medications as of 09/26/2024  Medication Sig   amLODipine  (NORVASC ) 5 MG tablet TAKE 1 TABLET(5 MG) BY MOUTH DAILY   ibuprofen  (ADVIL ) 800 MG tablet Take 1 tablet (800 mg total) by mouth every 8 (eight) hours as needed.   Omega-3 Fatty Acids (FISH OIL) 1200 MG CAPS Take 1,200 mg by mouth daily.   pantoprazole  (PROTONIX ) 40 MG tablet TAKE 1 TABLET(40 MG) BY MOUTH DAILY   triamcinolone   cream (KENALOG ) 0.1 % Apply 1 application topically daily as needed (Eczema).   UNABLE TO FIND Black seed oil-daily   [DISCONTINUED] lisinopril (ZESTRIL) 5 MG tablet Take by mouth.   No facility-administered encounter medications on file as of 09/26/2024.    ALLERGIES: Allergies  Allergen Reactions   Other Rash     GLOVE POWDER     Flexeril  [Cyclobenzaprine ] Palpitations    Happened with 5 mg dose    VACCINATION STATUS: Immunization History  Administered Date(s) Administered   Moderna Sars-Covid-2 Vaccination 06/10/2020, 07/08/2020   PPD Test 07/04/2018, 07/04/2018, 10/11/2023   Tdap 05/20/2022     HPI  Gina Coleman is 48 y.o. female who presents today with a medical history as above. she is being seen in consultation for hyperthyroidism and multinodular goiter requested by Tobie Suzzane POUR, MD.  She was previously managed by Dr. Sam but wanted somewhere closer to home.  She was first diagnosed with thyroid  issues in 2020.  She has had biopsies on 2 nodules in the past, both favoring benignity.  she has been dealing with symptoms of weight gain, joint aches, anxiety, insomnia.   she denies dysphagia, choking, shortness of breath, no recent voice change.    she denies any known family history of thyroid  dysfunction but she notes she really doesn't know her family history due to being in foster care. She is currently on Methimazole  5 mg po daily, started this in 2020.  She does take daily womens MVI (with Biotin in it).   Review of systems  Constitutional: +increasing body weight, current Body mass index is 42.71 kg/m., no fatigue, no subjective hyperthermia, no subjective hypothermia Eyes: no blurry vision, no xerophthalmia ENT: no sore throat, no nodules palpated in throat, no dysphagia/odynophagia, no hoarseness Cardiovascular: no chest pain, no shortness of breath, no palpitations, no leg swelling Respiratory: no cough, no shortness of breath Gastrointestinal:  no nausea/vomiting/diarrhea Musculoskeletal: + diffuse muscle/joint aches Skin: no rashes, no hyperemia Neurological: no tremors, no numbness, no tingling, no dizziness Psychiatric: no depression, + anxiety   Objective:    BP 124/76 (BP Location: Left Arm, Patient Position: Sitting, Cuff Size: Large)   Pulse 84   Ht 5' 4 (1.626 m)   Wt 248 lb 12.8 oz (112.9 kg)   LMP 08/12/2024 (Exact Date)   BMI 42.71 kg/m   Wt Readings from Last 3 Encounters:  09/26/24 248 lb 12.8 oz (112.9 kg)  09/07/24 247 lb (112 kg)  08/29/24 247 lb (112 kg)     BP Readings from Last 3 Encounters:  09/26/24 124/76  09/07/24 (!) 144/85  08/29/24 138/89  Physical Exam- Limited  Constitutional:  Body mass index is 42.71 kg/m. , not in acute distress, mildly anxious state of mind Eyes:  EOMI, no exophthalmos Neck: Supple Thyroid : ++ gross goiter Cardiovascular: RRR, no murmurs, rubs, or gallops, no edema Respiratory: Adequate breathing efforts, no crackles, rales, rhonchi, or wheezing Musculoskeletal: no gross deformities, strength intact in all four extremities, no gross restriction of joint movements Skin:  no rashes, no hyperemia Neurological: no tremor with outstretched hands, DTR normal in BLE   CMP     Component Value Date/Time   NA 138 06/25/2024 2355   NA 139 09/21/2023 1515   K 3.3 (L) 06/25/2024 2355   CL 104 06/25/2024 2355   CO2 25 06/25/2024 2355   GLUCOSE 105 (H) 06/25/2024 2355   BUN 15 06/25/2024 2355   BUN 12 09/21/2023 1515   CREATININE 0.88 06/25/2024 2355   CREATININE 0.61 08/27/2020 1142   CALCIUM 8.5 (L) 06/25/2024 2355   PROT 7.1 09/21/2023 1515   ALBUMIN 4.0 09/21/2023 1515   AST 17 09/21/2023 1515   ALT 15 09/21/2023 1515   ALKPHOS 138 (H) 09/21/2023 1515   BILITOT 0.3 09/21/2023 1515   GFR 69.56 05/13/2020 1535   EGFR 103 09/21/2023 1515   GFRNONAA >60 06/25/2024 2355   GFRNONAA 109 08/27/2020 1142     CBC    Component Value  Date/Time   WBC 7.6 06/25/2024 2355   RBC 4.34 06/25/2024 2355   HGB 12.4 06/25/2024 2355   HGB 12.4 09/21/2023 1515   HCT 38.3 06/25/2024 2355   HCT 38.4 09/21/2023 1515   PLT 211 06/25/2024 2355   PLT 227 09/21/2023 1515   MCV 88.2 06/25/2024 2355   MCV 91 09/21/2023 1515   MCH 28.6 06/25/2024 2355   MCHC 32.4 06/25/2024 2355   RDW 13.2 06/25/2024 2355   RDW 12.2 09/21/2023 1515   LYMPHSABS 1.9 09/21/2023 1515   MONOABS 0.6 07/16/2021 0333   EOSABS 0.3 09/21/2023 1515   BASOSABS 0.0 09/21/2023 1515     Diabetic Labs (most recent): Lab Results  Component Value Date   HGBA1C 5.9 (H) 09/21/2023   HGBA1C 5.8 (H) 09/28/2022    Lipid Panel     Component Value Date/Time   CHOL 185 09/21/2023 1515   TRIG 114 09/21/2023 1515   HDL 57 09/21/2023 1515   CHOLHDL 3.2 09/21/2023 1515   CHOLHDL 3.4 10/30/2019 1104   LDLCALC 108 (H) 09/21/2023 1515   LDLCALC 126 (H) 10/30/2019 1104   LABVLDL 20 09/21/2023 1515     Lab Results  Component Value Date   TSH 0.440 (L) 09/11/2024   TSH 1.460 09/21/2023   TSH 1.26 12/21/2022   TSH 1.23 02/11/2022   TSH 1.91 07/18/2021   TSH 1.397 10/31/2020   TSH 0.98 07/22/2020   TSH 0.13 (L) 05/13/2020   TSH 0.06 (L) 04/03/2020   TSH 0.14 (L) 11/14/2019   FREET4 1.06 09/11/2024   FREET4 0.98 09/21/2023   FREET4 0.61 12/21/2022   FREET4 0.79 02/11/2022   FREET4 0.69 07/18/2021   FREET4 0.72 07/22/2020   FREET4 0.87 05/13/2020   FREET4 0.98 11/14/2019   FREET4 1.11 09/07/2019   FREET4 1.07 07/11/2019    Thyroid  US  from 01/12/23 CLINICAL DATA:  S/P benign FNA in 2020 and 2023. Please evaluate   EXAM: THYROID  ULTRASOUND   TECHNIQUE: Ultrasound examination of the thyroid  gland and adjacent soft tissues was performed.   COMPARISON:  06/27/2019   08/07/2020   12/24/2020   02/05/2022  02/05/2022   FINDINGS: Parenchymal Echotexture: Mildly heterogenous   Isthmus: 0.7 cm   Right lobe: 8.4 x 3.1 x 3.4 cm   Left lobe: 7.5  x 2.6 x 3.2 cm   _________________________________________________________   Estimated total number of nodules >/= 1 cm: 6   Number of spongiform nodules >/=  2 cm not described below (TR1): 0   Number of mixed cystic and solid nodules >/= 1.5 cm not described below (TR2): 0   _________________________________________________________   Nodule 1: Previously biopsied isthmus nodule is not significantly changed in size currently measuring 1.9 x 1.1 x 1 x 1.8 cm. Please correlate with prior FNA results from 02/25/2022.   _________________________________________________________   Nodule 2: 4.4 x 1.4 x 3 x 1 cm solid hypoechoic right superior thyroid  nodule is unchanged in size since prior examination. Please correlate with prior FNA results from 08/02/2019.   _________________________________________________________   Nodule 3: 1.0 x 0.9 x 0.6 cm isoechoic solid isthmus nodule (TI-RADS 3) does not meet criteria for imaging surveillance or FNA.   _________________________________________________________   Nodule 4: 1.1 x 1.0 x 1.0 cm solid isoechoic right inferior thyroid  nodule (TI-RADS 3) is not significantly changed in size since 12/24/2020 and does not meet criteria for imaging surveillance or FNA.   _________________________________________________________   Nodule 5: 1.4 x 1.2 x 0.9 cm solid isoechoic right inferior thyroid  nodule (TI-RADS 3) is not significantly changed in size since 12/24/2020 and does not meet criteria for imaging surveillance or FNA.   _________________________________________________________   Nodule 6: 1.5 x 1.0 x 1.5 cm solid isoechoic left superior thyroid  nodule (TI-RADS 3) is not significantly changed in size since 12/24/2020 and does not meet criteria for imaging surveillance or FNA.   IMPRESSION: 1. Previously biopsied isthmus and right thyroid  nodules are not significantly changed in size. Please correlate with prior FNA results from  02/25/2022 and 08/02/2019. 2. Remaining bilateral thyroid  nodules are not significantly changed in size since 12/24/2020.   The above is in keeping with the ACR TI-RADS recommendations - J Am Coll Radiol 2017;14:587-595.     Electronically Signed   By: Aliene Lloyd M.D.   On: 01/13/2023 11:47   Thyroid  Ultrasound from 07/24/24 CLINICAL DATA:  Goiter. Prior fine-needle aspiration biopsy of isthmic thyroid  nodule in March of 2023, and right superior thyroid  nodule in September of 2020. Pathologic results are reportedly benign.   EXAM: THYROID  ULTRASOUND   TECHNIQUE: Ultrasound examination of the thyroid  gland and adjacent soft tissues was performed.   COMPARISON:  Most recent prior thyroid  ultrasound 01/12/2023   FINDINGS: Parenchymal Echotexture: Mildly heterogenous   Isthmus: 0.9 cm   Right lobe: 10.4 x 3.5 x 3.8 cm   Left lobe: 8.4 x 2.9 x 3.0 cm   _________________________________________________________   Estimated total number of nodules >/= 1 cm: 6-10   Number of spongiform nodules >/=  2 cm not described below (TR1): 0   Number of mixed cystic and solid nodules >/= 1.5 cm not described below (TR2): 0   _________________________________________________________   Nodule # 1: The previously biopsied isthmic thyroid  nodule is unchanged at 1.9 x 1.1 x 1.8 cm.   Nodule #2: The previously biopsied nodule in the right superior gland is grossly unchanged at 4.5 x 1.3 x 2.9 cm.   Nodule # 5: Slight interval enlargement of isoechoic solid nodule in the right lower gland which now measures 1.5 x 1.3 x 1.5 cm. Findings are consistent with TI-RADS category 3. *Given size (>/= 1.5 -  2.4 cm) and appearance, a follow-up ultrasound in 1 year should be considered based on TI-RADS criteria.   Nodule # 6: Minimally complex but predominantly simple cystic nodule in the left mid gland measures 2.4 x 1.2 x 1.5 cm. TI-RADS category 2. This nodule does NOT meet TI-RADS  criteria for biopsy or dedicated follow-up.   IMPRESSION: 1. Diffusely enlarged, heterogeneous and multinodular thyroid  gland again noted. 2. No significant interval change in the size or appearance of previously biopsied nodules in the right superior gland and thyroid  isthmus. 3. Nodule # 5 in the right lower gland (1.5 cm, TI-RADS category 3) has slightly enlarged and now meets criteria for imaging surveillance. Recommend follow-up ultrasound in 1 year.   The above is in keeping with the ACR TI-RADS recommendations - J Am Coll Radiol 2017;14:587-595.     Electronically Signed   By: Wilkie Lent M.D.   On: 07/30/2024 11:43    Latest Reference Range & Units 02/11/22 10:50 12/21/22 10:32 09/21/23 15:15 09/11/24 14:00  TSH 0.450 - 4.500 uIU/mL 1.23 1.26 1.460 0.440 (L)  Triiodothyronine,Free,Serum 2.0 - 4.4 pg/mL    3.0  T4,Free(Direct) 0.82 - 1.77 ng/dL 9.20 9.38 9.01 8.93  Thyroperoxidase Ab SerPl-aCnc 0 - 34 IU/mL    13  Thyroglobulin Antibody 0.0 - 0.9 IU/mL    <1.0  (L): Data is abnormally low Assessment & Plan:   1. Multinodular goiter (Primary) 2. Subclinical hyperthyroidism  she is being seen at a kind request of Tobie Suzzane POUR, MD.  her history and most recent labs are reviewed, and she was examined clinically. Subjective and objective findings are consistent with thyrotoxicosis likely from primary hyperthyroidism. The potential risks of untreated thyrotoxicosis and the need for definitive therapy have been discussed in detail with her, and she agrees to proceed with diagnostic workup and treatment plan.   I will repeat full profile thyroid  function tests today (including antibody testing to assess for autoimmune thyroid  dysfunction).  She does have son recently diagnosed with type 1 diabetes.  Antibody testing was negative, ruling out autoimmune etiology.   Since discontinuation of Methimazole , TSH has become slightly suppressed but Free T4 and Free T3 were all  normal, still consistent with subclinical hyperthyroidism.  She does not have overt symptoms of hyperthyroidism, thus will keep her off the medication for now.  Her repeat thyroid  ultrasound shows increase in size of her thyroid  gland overall (right side growing from 8.4 cm to 10.4 cm and left side growing from 7.5 cm to 8.4 cm) and multiple nodules, only one of which meets criteria for follow up in 1 year.  She does not have compressive symptoms but is concerned with the rate of growth.  We discussed repeating ultrasound in 6 months vs surgical consult for total thyroidectomy (which would also eliminate issue of the suppressed TSH).  She is aware that she would be started on thyroid  hormone replacement therapy immediately following surgery but that medication does not carry the same risk as long-term use of Methimazole .  Ultimately, she did decide to proceed with the surgical consult.  I entered the referral to Dr. Krystal Spinner in [place].    Her follow up here will depend on her visit with him.  I did ask her to reach out after the consultation for an update and to make sure she gets the proper follow up.    -Patient is advised to maintain close follow up with Tobie Suzzane POUR, MD for primary care needs.  I spent  46  minutes in the care of the patient today including review of labs from Thyroid  Function, CMP, and other relevant labs ; imaging/biopsy records (current and previous including abstractions from other facilities); face-to-face time discussing  her lab results and symptoms, medications doses, her options of short and long term treatment based on the latest standards of care / guidelines;   and documenting the encounter.  Gina Coleman  participated in the discussions, expressed understanding, and voiced agreement with the above plans.  All questions were answered to her satisfaction. she is encouraged to contact clinic should she have any questions or concerns prior to her  return visit.  Follow up plan: Return patient will call back here after consult with Dr. Eletha to schedule follow up..   Thank you for involving me in the care of this pleasant patient, and I will continue to update you with her progress.    Benton Rio, [facility] [address] [place], [place] [address] Phone: [phone] Fax: [phone]  09/26/2024, 3:10 PM

## 2024-10-02 ENCOUNTER — Ambulatory Visit: Attending: Internal Medicine | Admitting: Internal Medicine

## 2024-10-02 NOTE — Progress Notes (Signed)
 Erroneous encounter - please disregard.

## 2024-10-03 ENCOUNTER — Encounter: Payer: Self-pay | Admitting: Internal Medicine

## 2024-10-23 ENCOUNTER — Ambulatory Visit: Admitting: Adult Health

## 2024-10-23 ENCOUNTER — Ambulatory Visit: Admitting: Internal Medicine

## 2024-10-25 ENCOUNTER — Ambulatory Visit: Payer: Self-pay

## 2024-10-25 NOTE — Telephone Encounter (Signed)
 FYI Only or Action Required?: FYI only for provider: appointment scheduled on 12/2.  Patient was last seen in primary care on 05/10/2024 by Tobie Suzzane POUR, MD.  Called Nurse Triage reporting Abdominal Pain.  Symptoms began a week ago.  Interventions attempted: OTC medications: ibuprofen .  Symptoms are: stable.  Triage Disposition: See Physician Within 24 Hours  Patient/caregiver understands and will follow disposition?: Yes  Message from [place] T sent at 10/25/2024  4:06 PM EST  Reason for Triage: abdominal pain, lower left side, pain level 3- [phone]   Reason for Disposition  [1] MILD pain (e.g., does not interfere with normal activities) AND [2] pain comes and goes (cramps) AND [3] present > 48 hours  (Exception: This same abdominal pain is a chronic symptom recurrent or ongoing AND present > 4 weeks.)  Answer Assessment - Initial Assessment Questions Hx of Uterine Fibroids on the left side. Started acting up about a month ago and saw OBGYN- ibuprofen  and US . Fibroid has not changed. Pain went away after about a week or two and has returned for the last week.  Denies Fever, CP, SOB, Dizziness, paresthesias, bladder or bowel sx, or radiating to other areas.  GYN said its not her fibroid- call PCP  Pain mostly relieved with ibuprofen  and heating pad. Intermittent. Denies relief with BM.  Appt with PCP office next week. ED/UC precautions for the long weekend advised and understood.  1. LOCATION: Where does it hurt?      Left lower abdomen  2. RADIATION: Does the pain shoot anywhere else? (e.g., chest, back)     denies 3. ONSET: When did the pain begin? (e.g., minutes, hours or days ago)      About month - went away and then came back about a week ago  4. SUDDEN: Gradual or sudden onset?     sudden 5. PATTERN Does the pain come and go, or is it constant?     Comes and goes  6. SEVERITY: How bad is the pain?  (e.g., Scale 1-10; mild, moderate, or severe)      3/10- ibuprofen /laying down on a heating pad 7. RECURRENT SYMPTOM: Have you ever had this type of stomach pain before? If Yes, ask: When was the last time? and What happened that time?      About a month ago and has returned  8. CAUSE: What do you think is causing the stomach pain? (e.g., gallstones, recent abdominal surgery)     Uterine Fibroids 9. RELIEVING/AGGRAVATING FACTORS: What makes it better or worse? (e.g., antacids, bending or twisting motion, bowel movement)     Laying down with heating pad helps,  10. OTHER SYMPTOMS: Do you have any other symptoms? (e.g., back pain, diarrhea, fever, urination pain, vomiting)       Denies  Protocols used: Abdominal Pain - Female-A-AH

## 2024-10-30 NOTE — Telephone Encounter (Signed)
 Appt moved to see pcp at 2:40pm

## 2024-10-31 ENCOUNTER — Encounter: Payer: Self-pay | Admitting: Internal Medicine

## 2024-10-31 ENCOUNTER — Ambulatory Visit: Payer: Self-pay | Admitting: Internal Medicine

## 2024-10-31 VITALS — BP 137/80 | HR 86 | Ht 64.0 in | Wt 247.2 lb

## 2024-10-31 DIAGNOSIS — K529 Noninfective gastroenteritis and colitis, unspecified: Secondary | ICD-10-CM | POA: Insufficient documentation

## 2024-10-31 DIAGNOSIS — K219 Gastro-esophageal reflux disease without esophagitis: Secondary | ICD-10-CM

## 2024-10-31 DIAGNOSIS — R1032 Left lower quadrant pain: Secondary | ICD-10-CM | POA: Diagnosis not present

## 2024-10-31 MED ORDER — PANTOPRAZOLE SODIUM 40 MG PO TBEC: 40.0000 mg | DELAYED_RELEASE_TABLET | Freq: Every day | ORAL | 1 refills | Status: AC

## 2024-10-31 MED ORDER — ONDANSETRON HCL 4 MG PO TABS: 4.0000 mg | ORAL_TABLET | Freq: Three times a day (TID) | ORAL | 0 refills | Status: AC | PRN

## 2024-10-31 MED ORDER — AMOXICILLIN-POT CLAVULANATE 875-125 MG PO TABS: 1.0000 | ORAL_TABLET | Freq: Two times a day (BID) | ORAL | 0 refills | Status: AC

## 2024-10-31 NOTE — Progress Notes (Signed)
 Acute Office Visit  Subjective:    Patient ID: Gina Coleman, female    DOB: March 05, 1976, 48 y.o.   MRN: [id number]  Chief Complaint  Patient presents with   Abdominal Pain    Reports llq pain ongoing longer than 1 week.    HPI Patient is in today for left lower quadrant abdominal pain for the last 1 week.  Her pain is intermittent, worse after eating.  She initially went to OB/GYN, as she thought it was related to her history of fibroids.  She was advised to take ibuprofen , which worsen her abdominal pain.  She also reports intermittent nausea, but denies episodes of vomiting.  Denies any melena or hematochezia.  Denies any recent change in bowel frequency.  Denies any fever or chills.  Denies dysuria, hematuria or flank pain currently.  Past Medical History:  Diagnosis Date   Acid reflux    Anxiety    Headache    Hypertension    Hyperthyroidism     Past Surgical History:  Procedure Laterality Date   BIOPSY  06/05/2021   Procedure: BIOPSY;  Surgeon: Golda Claudis PENNER, MD;  Location: [facility];  Service: Endoscopy;;   COLONOSCOPY N/A 06/05/2021   rehman:The examined portion of the ileum was normal. one 4mm polyp in cecum (tubular adenoma). external hemorrhoids   ESOPHAGOGASTRODUODENOSCOPY N/A 06/05/2021   Rehman:Normal hypopharynx.normal esophagus, z line irregular 36 cm from incisors, gastritics with reactive gastropathy, normal duodenal bulb and second portion of duodenum   FOOT SURGERY Right    LEG SURGERY Right    TRANSFORAMINAL LUMBAR INTERBODY FUSION W/ MIS 1 LEVEL N/A 12/08/2021   Procedure: Lumbar Three-Four Minimally Invasive Transforaminal Lumbar Interbody Fusion;  Surgeon: Cheryle Debby LABOR, MD;  Location: [facility] OR;  Service: Neurosurgery;  Laterality: N/A;  Lumbar Three-Four Minimally Invasive Transforaminal Lumbar Interbody Fusion   TUBAL LIGATION      Family History  Problem Relation Age of Onset   Breast cancer Maternal Grandmother    CAD Mother         Premature disease   Hypertension Sister    HIV Sister    Asthma Son    Diabetes Son        Type 1    Social History   Socioeconomic History   Marital status: Married    Spouse name: Not on file   Number of children: Not on file   Years of education: Not on file   Highest education level: Not on file  Occupational History   Not on file  Tobacco Use   Smoking status: Former    Types: Cigarettes   Smokeless tobacco: Never  Vaping Use   Vaping status: Never Used  Substance and Sexual Activity   Alcohol use: Yes    Alcohol/week: 3.0 standard drinks of alcohol    Types: 3 Glasses of wine per week    Comment: social    Drug use: Never   Sexual activity: Yes    Birth control/protection: Surgical    Comment: tubal  Other Topics Concern   Not on file  Social History Narrative   Not on file   Social Drivers of Health   Financial Resource Strain: Low Risk  (09/20/2023)   Overall Financial Resource Strain (CARDIA)    Difficulty of Paying Living Expenses: Not hard at all  Food Insecurity: No Food Insecurity (09/20/2023)   Hunger Vital Sign    Worried About Running Out of Food in the Last Year: Never true  Ran Out of Food in the Last Year: Never true  Transportation Needs: No Transportation Needs (09/20/2023)   PRAPARE - Administrator, Civil Service (Medical): No    Lack of Transportation (Non-Medical): No  Physical Activity: Inactive (09/20/2023)   Exercise Vital Sign    Days of Exercise per Week: 0 days    Minutes of Exercise per Session: 10 min  Stress: No Stress Concern Present (09/20/2023)   [employer] of Occupational Health - Occupational Stress Questionnaire    Feeling of Stress : Only a little  Social Connections: Socially Integrated (09/20/2023)   Social Connection and Isolation Panel    Frequency of Communication with Friends and Family: More than three times a week    Frequency of Social Gatherings with Friends and Family: Once a week     Attends Religious Services: More than 4 times per year    Active Member of [employer] or Organizations: Yes    Attends Banker Meetings: Never    Marital Status: Married  Catering Manager Violence: Not At Risk (09/20/2023)   Humiliation, Afraid, Rape, and Kick questionnaire    Fear of Current or Ex-Partner: No    Emotionally Abused: No    Physically Abused: No    Sexually Abused: No    Outpatient Medications Prior to Visit  Medication Sig Dispense Refill   amLODipine  (NORVASC ) 5 MG tablet TAKE 1 TABLET(5 MG) BY MOUTH DAILY 90 tablet 3   ibuprofen  (ADVIL ) 800 MG tablet Take 1 tablet (800 mg total) by mouth every 8 (eight) hours as needed. 60 tablet 1   Omega-3 Fatty Acids (FISH OIL) 1200 MG CAPS Take 1,200 mg by mouth daily.     triamcinolone  cream (KENALOG ) 0.1 % Apply 1 application topically daily as needed (Eczema).     UNABLE TO FIND Black seed oil-daily     pantoprazole  (PROTONIX ) 40 MG tablet TAKE 1 TABLET(40 MG) BY MOUTH DAILY 90 tablet 0   No facility-administered medications prior to visit.    Allergies  Allergen Reactions   Other Rash     GLOVE POWDER     Flexeril  [Cyclobenzaprine ] Palpitations    Happened with 5 mg dose    Review of Systems  Constitutional:  Negative for chills and fever.  HENT:  Negative for congestion, sinus pressure, sinus pain and sore throat.   Eyes:  Negative for pain and discharge.  Respiratory:  Negative for cough and shortness of breath.   Cardiovascular:  Negative for chest pain and palpitations.  Gastrointestinal:  Positive for abdominal pain and nausea. Negative for diarrhea and vomiting.  Endocrine: Negative for polydipsia and polyuria.  Genitourinary:  Negative for dysuria and hematuria.  Musculoskeletal:  Positive for arthralgias. Negative for neck pain and neck stiffness.  Skin:  Negative for rash.  Neurological:  Negative for dizziness and weakness.  Psychiatric/Behavioral:  Positive for sleep disturbance. Negative for  agitation and behavioral problems. The patient is nervous/anxious.        Objective:    Physical Exam Vitals reviewed.  Constitutional:      General: She is not in acute distress.    Appearance: She is obese. She is not diaphoretic.  HENT:     Head: Normocephalic and atraumatic.     Nose: Nose normal. No congestion.     Mouth/Throat:     Mouth: Mucous membranes are moist.     Pharynx: No posterior oropharyngeal erythema.  Eyes:     General: No scleral icterus.  Extraocular Movements: Extraocular movements intact.  Neck:     Thyroid : Thyromegaly present. No thyroid  tenderness.  Cardiovascular:     Rate and Rhythm: Normal rate and regular rhythm.     Heart sounds: Normal heart sounds. No murmur heard. Pulmonary:     Breath sounds: Normal breath sounds. No wheezing or rales.  Abdominal:     General: Bowel sounds are normal.     Palpations: Abdomen is soft.     Tenderness: There is abdominal tenderness (LLQ).  Musculoskeletal:     Cervical back: Normal range of motion. No rigidity.     Right knee: No swelling. No tenderness.     Left knee: Swelling (Mild) present. No tenderness.     Right lower leg: No edema.     Left lower leg: No edema.  Skin:    General: Skin is warm.     Findings: No rash.  Neurological:     General: No focal deficit present.     Mental Status: She is alert and oriented to person, place, and time.     Sensory: No sensory deficit.     Motor: No weakness.  Psychiatric:        Mood and Affect: Mood normal.        Behavior: Behavior normal.     BP 137/80   Pulse 86   Ht 5' 4 (1.626 m)   Wt 247 lb 3.2 oz (112.1 kg)   SpO2 99%   BMI 42.43 kg/m  Wt Readings from Last 3 Encounters:  10/31/24 247 lb 3.2 oz (112.1 kg)  09/26/24 248 lb 12.8 oz (112.9 kg)  09/07/24 247 lb (112 kg)        Assessment & Plan:   Problem List Items Addressed This Visit       Digestive   GERD (gastroesophageal reflux disease)   Well-controlled overall, but  has occasional heartburn Takes pantoprazole  40 mg QD, advised to take pantoprazole  BID for 1 week Avoid hot and spicy food Advised to avoid oral NSAIDs, including ibuprofen  for now      Relevant Medications   ondansetron  (ZOFRAN ) 4 MG tablet   pantoprazole  (PROTONIX ) 40 MG tablet   Colitis - Primary   LLQ abdominal pain, could be related to colitis Check CT abdomen pelvis without contrast Started empiric Augmentin Zofran  as needed for nausea Advised to follow GI soft diet, can advance as tolerated Maintain at least 64 ounces of fluid intake in a day      Relevant Medications   amoxicillin-clavulanate (AUGMENTIN) 875-125 MG tablet   ondansetron  (ZOFRAN ) 4 MG tablet   Other Relevant Orders   CT ABDOMEN PELVIS WO CONTRAST   Other Visit Diagnoses       Left lower quadrant abdominal pain       Relevant Orders   CT ABDOMEN PELVIS WO CONTRAST        Meds ordered this encounter  Medications   amoxicillin-clavulanate (AUGMENTIN) 875-125 MG tablet    Sig: Take 1 tablet by mouth 2 (two) times daily.    Dispense:  14 tablet    Refill:  0   ondansetron  (ZOFRAN ) 4 MG tablet    Sig: Take 1 tablet (4 mg total) by mouth every 8 (eight) hours as needed for nausea or vomiting.    Dispense:  20 tablet    Refill:  0   pantoprazole  (PROTONIX ) 40 MG tablet    Sig: Take 1 tablet (40 mg total) by mouth daily.    Dispense:  90 tablet    Refill:  1    **Patient requests 90 days supply**     Suzzane MARLA Blanch, MD

## 2024-10-31 NOTE — Assessment & Plan Note (Addendum)
 Well-controlled overall, but has occasional heartburn Takes pantoprazole  40 mg QD, advised to take pantoprazole  BID for 1 week Avoid hot and spicy food Advised to avoid oral NSAIDs, including ibuprofen  for now

## 2024-10-31 NOTE — Patient Instructions (Signed)
Please start taking Augmentin as prescribed.  Please take Zofran as needed for nausea.

## 2024-10-31 NOTE — Assessment & Plan Note (Signed)
 LLQ abdominal pain, could be related to colitis Check CT abdomen pelvis without contrast Started empiric Augmentin Zofran  as needed for nausea Advised to follow GI soft diet, can advance as tolerated Maintain at least 64 ounces of fluid intake in a day

## 2024-11-02 DIAGNOSIS — E0520 Thyrotoxicosis with toxic multinodular goiter without thyrotoxic crisis or storm: Secondary | ICD-10-CM | POA: Diagnosis not present

## 2024-11-06 ENCOUNTER — Other Ambulatory Visit: Payer: Self-pay | Admitting: Surgery

## 2024-11-06 DIAGNOSIS — E0520 Thyrotoxicosis with toxic multinodular goiter without thyrotoxic crisis or storm: Secondary | ICD-10-CM

## 2024-11-08 ENCOUNTER — Ambulatory Visit (HOSPITAL_COMMUNITY): Admission: RE | Admit: 2024-11-08 | Discharge: 2024-11-08 | Attending: Internal Medicine

## 2024-11-08 DIAGNOSIS — K529 Noninfective gastroenteritis and colitis, unspecified: Secondary | ICD-10-CM | POA: Diagnosis not present

## 2024-11-08 DIAGNOSIS — R1032 Left lower quadrant pain: Secondary | ICD-10-CM | POA: Diagnosis present

## 2024-11-09 ENCOUNTER — Telehealth: Payer: Self-pay

## 2024-11-09 DIAGNOSIS — E0590 Thyrotoxicosis, unspecified without thyrotoxic crisis or storm: Secondary | ICD-10-CM

## 2024-11-09 DIAGNOSIS — E042 Nontoxic multinodular goiter: Secondary | ICD-10-CM

## 2024-11-09 NOTE — Telephone Encounter (Signed)
 I will send in script for Methimazole .  Will need follow up appt with previsit labs in 8 weeks (I also entered the orders).  Will attach front office staff to get her scheduled for that.

## 2024-11-09 NOTE — Telephone Encounter (Signed)
 Patient reports that she recently consulted with Dr. Eletha on 11/02/24 regarding goiter surgery, which is scheduled for March/April 2026. She was instructed to contact our office after her visit with Dr. Eletha to resume Methimazole  5 mg therapy until her follow-up with us . Please send the prescription to [facility] on [address] Additionally, the patient would like clarification on when she should schedule her next follow-up appointment with our office. Please advise.

## 2024-11-10 ENCOUNTER — Ambulatory Visit: Payer: Self-pay | Admitting: Internal Medicine

## 2024-11-13 ENCOUNTER — Other Ambulatory Visit: Payer: Self-pay | Admitting: Internal Medicine

## 2024-11-13 DIAGNOSIS — K219 Gastro-esophageal reflux disease without esophagitis: Secondary | ICD-10-CM

## 2024-11-20 ENCOUNTER — Other Ambulatory Visit (HOSPITAL_COMMUNITY): Payer: Self-pay | Admitting: Surgery

## 2024-11-20 DIAGNOSIS — E0520 Thyrotoxicosis with toxic multinodular goiter without thyrotoxic crisis or storm: Secondary | ICD-10-CM

## 2024-11-22 ENCOUNTER — Ambulatory Visit (HOSPITAL_COMMUNITY)
Admission: RE | Admit: 2024-11-22 | Discharge: 2024-11-22 | Disposition: A | Discharge status: Home / Self Care | Source: Ambulatory Visit | Attending: Surgery | Admitting: Surgery

## 2024-11-22 DIAGNOSIS — E042 Nontoxic multinodular goiter: Secondary | ICD-10-CM

## 2024-11-22 DIAGNOSIS — E0520 Thyrotoxicosis with toxic multinodular goiter without thyrotoxic crisis or storm: Secondary | ICD-10-CM | POA: Insufficient documentation

## 2024-11-22 MED ORDER — IOHEXOL 300 MG/ML  SOLN
75.0000 mL | Freq: Once | INTRAMUSCULAR | Status: AC | PRN
  Administered 2024-11-22: 75 mL via INTRAVENOUS

## 2024-12-27 ENCOUNTER — Ambulatory Visit: Payer: Self-pay | Admitting: Surgery

## 2024-12-27 NOTE — Progress Notes (Signed)
 Images from CT scan have been reviewed.  No significant tracheal narrowing or worrisome findings.  Will contact patient in March 2026 to discussed timing of thyroid  surgery.  Krystal Spinner, MD [facility] A [facility] practice Office: [phone]

## 2024-12-29 ENCOUNTER — Other Ambulatory Visit: Payer: Self-pay | Admitting: Internal Medicine

## 2024-12-29 DIAGNOSIS — M25512 Pain in left shoulder: Secondary | ICD-10-CM

## 2025-01-04 LAB — T4, FREE: Free T4: 1.08 ng/dL (ref 0.82–1.77)

## 2025-01-04 LAB — TSH: TSH: 0.848 u[IU]/mL (ref 0.450–4.500)

## 2025-01-04 LAB — T3, FREE: T3, Free: 3.5 pg/mL (ref 2.0–4.4)

## 2025-01-08 ENCOUNTER — Ambulatory Visit: Admitting: Nurse Practitioner

## 2025-01-08 DIAGNOSIS — E042 Nontoxic multinodular goiter: Secondary | ICD-10-CM

## 2025-01-08 DIAGNOSIS — E0590 Thyrotoxicosis, unspecified without thyrotoxic crisis or storm: Secondary | ICD-10-CM
